# Patient Record
Sex: Female | Born: 1945 | Race: White | Hispanic: No | Smoking: Never smoker
Health system: Southern US, Community
[De-identification: ages and names within clinical notes are randomized; demographics above are authoritative.]

## PROBLEM LIST (undated history)

## (undated) DIAGNOSIS — M1990 Unspecified osteoarthritis, unspecified site: Secondary | ICD-10-CM

## (undated) DIAGNOSIS — Z9889 Other specified postprocedural states: Secondary | ICD-10-CM

## (undated) DIAGNOSIS — F32A Depression, unspecified: Secondary | ICD-10-CM

## (undated) DIAGNOSIS — K7460 Unspecified cirrhosis of liver: Secondary | ICD-10-CM

## (undated) DIAGNOSIS — M545 Low back pain, unspecified: Secondary | ICD-10-CM

## (undated) DIAGNOSIS — M797 Fibromyalgia: Secondary | ICD-10-CM

## (undated) DIAGNOSIS — G500 Trigeminal neuralgia: Secondary | ICD-10-CM

## (undated) DIAGNOSIS — E785 Hyperlipidemia, unspecified: Secondary | ICD-10-CM

## (undated) DIAGNOSIS — E039 Hypothyroidism, unspecified: Secondary | ICD-10-CM

## (undated) DIAGNOSIS — R112 Nausea with vomiting, unspecified: Secondary | ICD-10-CM

## (undated) DIAGNOSIS — J302 Other seasonal allergic rhinitis: Secondary | ICD-10-CM

## (undated) DIAGNOSIS — K7581 Nonalcoholic steatohepatitis (NASH): Secondary | ICD-10-CM

## (undated) DIAGNOSIS — F419 Anxiety disorder, unspecified: Secondary | ICD-10-CM

## (undated) DIAGNOSIS — K589 Irritable bowel syndrome without diarrhea: Secondary | ICD-10-CM

## (undated) DIAGNOSIS — G2581 Restless legs syndrome: Secondary | ICD-10-CM

## (undated) DIAGNOSIS — G43909 Migraine, unspecified, not intractable, without status migrainosus: Secondary | ICD-10-CM

## (undated) DIAGNOSIS — F329 Major depressive disorder, single episode, unspecified: Secondary | ICD-10-CM

## (undated) DIAGNOSIS — K219 Gastro-esophageal reflux disease without esophagitis: Secondary | ICD-10-CM

## (undated) DIAGNOSIS — J45909 Unspecified asthma, uncomplicated: Secondary | ICD-10-CM

## (undated) DIAGNOSIS — I10 Essential (primary) hypertension: Secondary | ICD-10-CM

## (undated) DIAGNOSIS — G8929 Other chronic pain: Secondary | ICD-10-CM

## (undated) DIAGNOSIS — Z8619 Personal history of other infectious and parasitic diseases: Secondary | ICD-10-CM

## (undated) DIAGNOSIS — C50919 Malignant neoplasm of unspecified site of unspecified female breast: Secondary | ICD-10-CM

## (undated) HISTORY — DX: Fibromyalgia: M79.7

## (undated) HISTORY — PX: DILATION AND CURETTAGE OF UTERUS: SHX78

## (undated) HISTORY — DX: Depression, unspecified: F32.A

## (undated) HISTORY — DX: Restless legs syndrome: G25.81

## (undated) HISTORY — DX: Irritable bowel syndrome, unspecified: K58.9

## (undated) HISTORY — PX: ABDOMINAL HYSTERECTOMY: SHX81

## (undated) HISTORY — PX: TONSILLECTOMY: SUR1361

## (undated) HISTORY — DX: Other chronic pain: G89.29

## (undated) HISTORY — DX: Low back pain, unspecified: M54.50

## (undated) HISTORY — DX: Major depressive disorder, single episode, unspecified: F32.9

## (undated) HISTORY — DX: Hypothyroidism, unspecified: E03.9

## (undated) HISTORY — DX: Gastro-esophageal reflux disease without esophagitis: K21.9

## (undated) HISTORY — DX: Hyperlipidemia, unspecified: E78.5

## (undated) HISTORY — DX: Low back pain: M54.5

## (undated) HISTORY — DX: Anxiety disorder, unspecified: F41.9

## (undated) HISTORY — DX: Migraine, unspecified, not intractable, without status migrainosus: G43.909

## (undated) HISTORY — DX: Essential (primary) hypertension: I10

## (undated) HISTORY — DX: Malignant neoplasm of unspecified site of unspecified female breast: C50.919

## (undated) HISTORY — DX: Trigeminal neuralgia: G50.0

## (undated) HISTORY — PX: CYSTOCELE REPAIR: SHX163

## (undated) HISTORY — DX: Unspecified osteoarthritis, unspecified site: M19.90

## (undated) HISTORY — PX: SEPTOPLASTY: SUR1290

## (undated) HISTORY — PX: RECTOCELE REPAIR: SHX761

## (undated) HISTORY — DX: Personal history of other infectious and parasitic diseases: Z86.19

## (undated) HISTORY — DX: Nonalcoholic steatohepatitis (NASH): K75.81

---

## 1989-04-17 HISTORY — PX: TOTAL ABDOMINAL HYSTERECTOMY W/ BILATERAL SALPINGOOPHORECTOMY: SHX83

## 1999-04-18 HISTORY — PX: ROTATOR CUFF REPAIR: SHX139

## 2003-04-18 HISTORY — PX: CHOLECYSTECTOMY: SHX55

## 2004-04-17 HISTORY — PX: ANKLE SURGERY: SHX546

## 2004-11-30 ENCOUNTER — Ambulatory Visit: Payer: Self-pay

## 2005-08-29 ENCOUNTER — Ambulatory Visit: Payer: Self-pay | Admitting: Family Medicine

## 2006-04-17 HISTORY — PX: GASTRIC BYPASS: SHX52

## 2006-05-09 ENCOUNTER — Ambulatory Visit: Payer: Self-pay | Admitting: Family Medicine

## 2006-05-14 ENCOUNTER — Ambulatory Visit: Payer: Self-pay | Admitting: Internal Medicine

## 2006-09-11 ENCOUNTER — Ambulatory Visit: Payer: Self-pay | Admitting: Internal Medicine

## 2006-11-21 ENCOUNTER — Ambulatory Visit: Payer: Self-pay | Admitting: Otolaryngology

## 2008-05-21 ENCOUNTER — Ambulatory Visit: Payer: Self-pay | Admitting: Family Medicine

## 2009-01-22 IMAGING — CR DG LUMBAR SPINE AP/LAT/OBLIQUES W/ FLEX AND EXT
1 series · 5 of 5 positions shown · non-contrast
Comparison: none

REASON FOR EXAM: xray l-spine pain
COMMENTS:

[Series 1: view not recorded · 0.17mm/px · 5 of 5 slices shown]
[im 1/5]
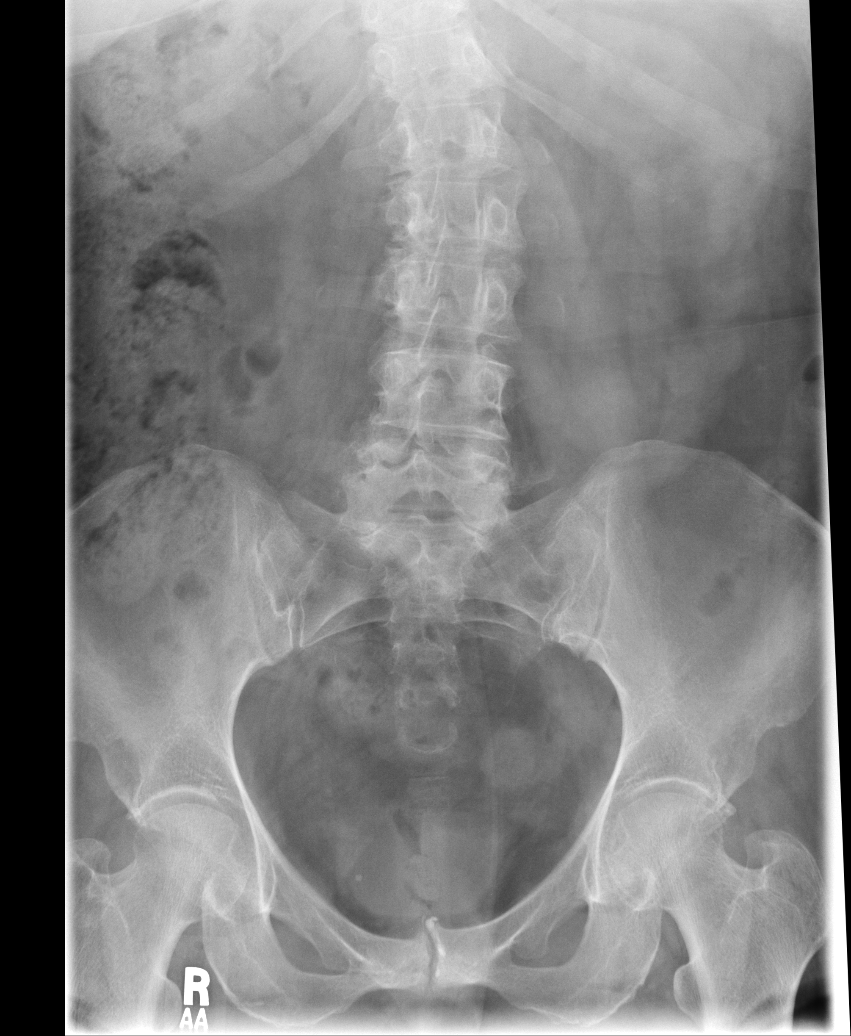
[im 2/5]
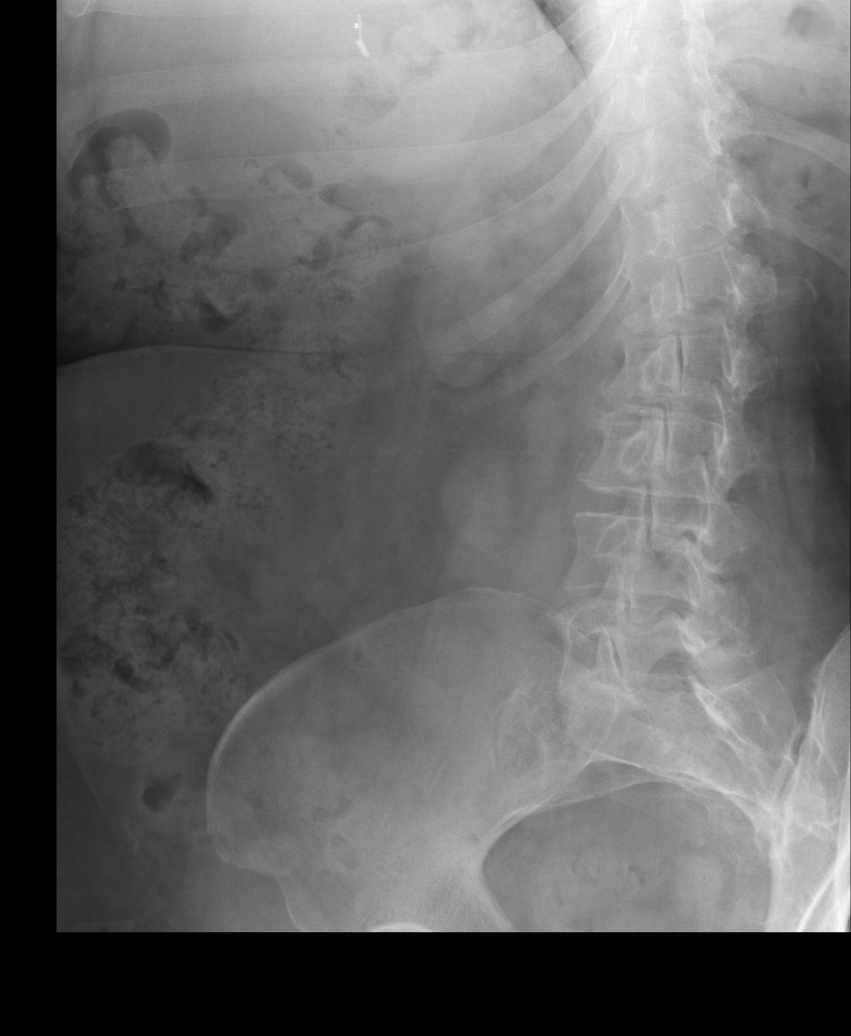
[im 3/5]
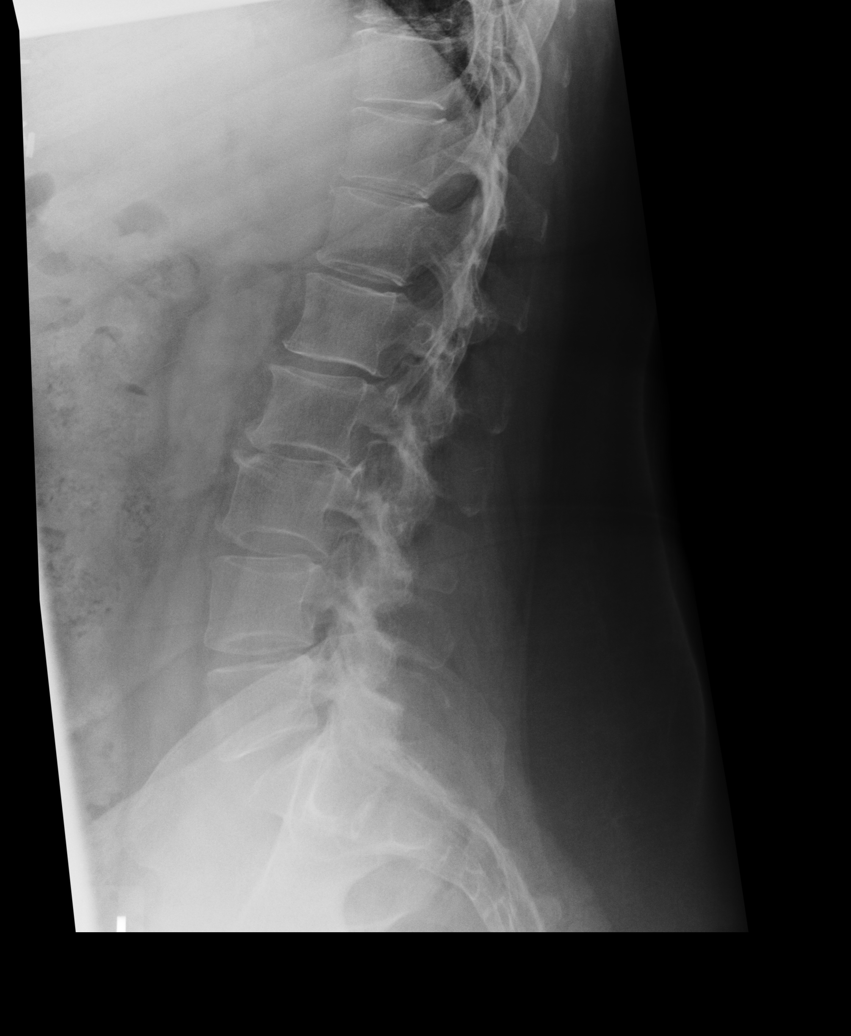
[im 4/5]
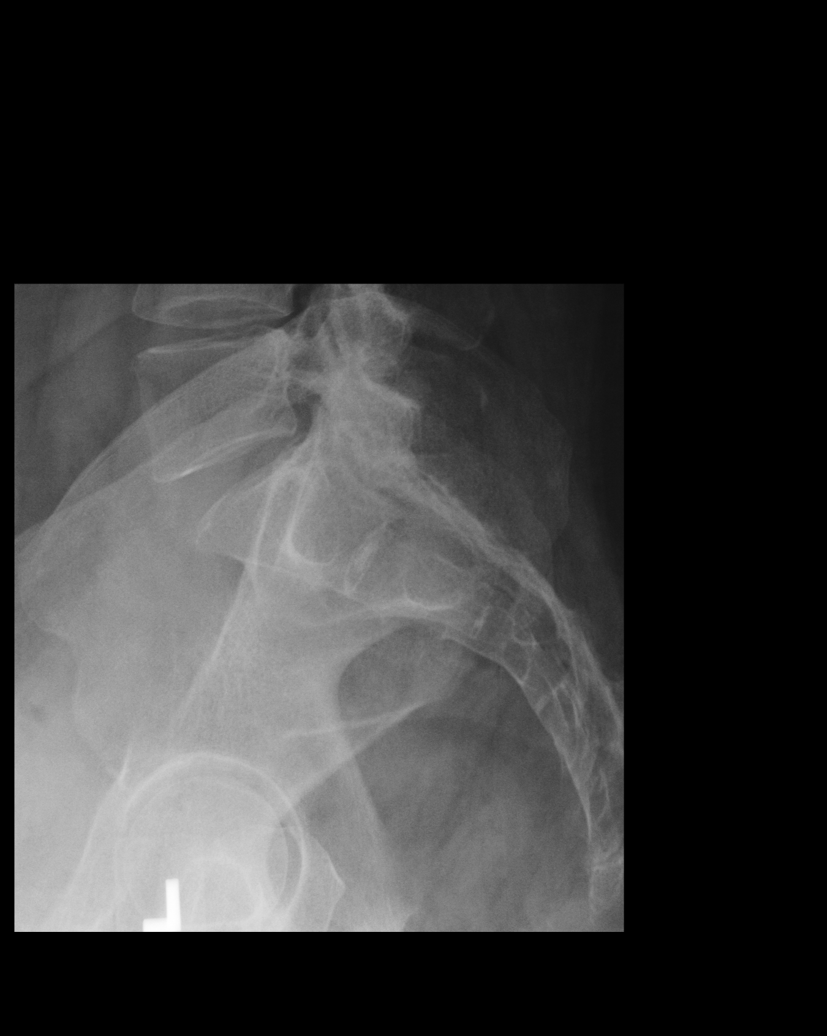
[im 5/5]
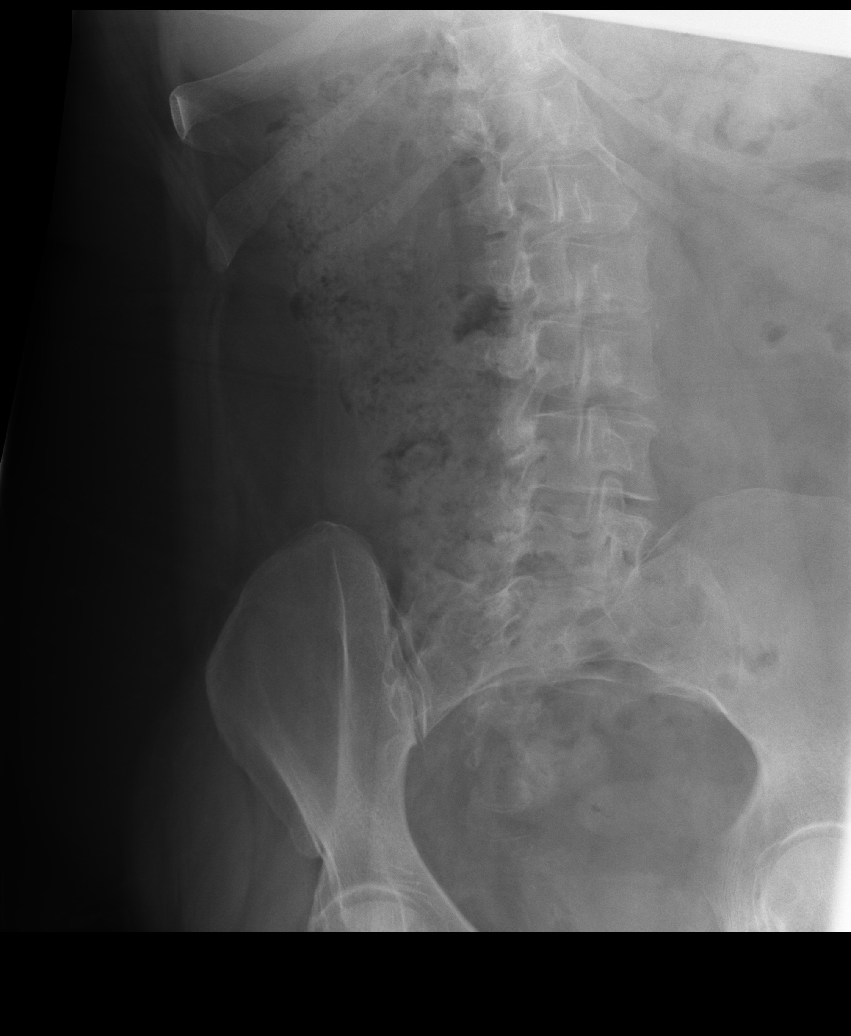

[5 of 5 positions shown; findings below may reference images not displayed]

PROCEDURE:     DXR - DXR LUMBAR SPINE WITH OBLIQUES  - May 09, 2006  [DATE]

RESULT:     Complete lumbar spine series is compared to the prior study of
11/30/04.  AP view shows a scoliotic curvature concave to the RIGHT centered
at the L1 level.  The bony structures remain intact.  There is some mild
intervertebral disk space narrowing.  Hypertrophic endplate spurring is
present.  No fracture is demonstrated.
IMPRESSION: Degenerative changes.

No acute bony abnormality.

## 2009-04-17 HISTORY — PX: MASTECTOMY: SHX3

## 2009-04-17 HISTORY — PX: BREAST BIOPSY: SHX20

## 2009-06-10 ENCOUNTER — Ambulatory Visit: Payer: Self-pay | Admitting: Family Medicine

## 2010-01-15 DIAGNOSIS — C50919 Malignant neoplasm of unspecified site of unspecified female breast: Secondary | ICD-10-CM

## 2010-01-15 HISTORY — DX: Malignant neoplasm of unspecified site of unspecified female breast: C50.919

## 2010-03-03 ENCOUNTER — Encounter: Payer: Self-pay | Admitting: Specialist

## 2011-06-12 ENCOUNTER — Encounter: Payer: Self-pay | Admitting: Internal Medicine

## 2011-06-12 ENCOUNTER — Ambulatory Visit (INDEPENDENT_AMBULATORY_CARE_PROVIDER_SITE_OTHER): Payer: Medicare Other | Admitting: Internal Medicine

## 2011-06-12 VITALS — BP 100/60 | HR 83 | Temp 98.1°F | Ht 61.0 in | Wt 199.0 lb

## 2011-06-12 DIAGNOSIS — M79604 Pain in right leg: Secondary | ICD-10-CM | POA: Insufficient documentation

## 2011-06-12 DIAGNOSIS — M79609 Pain in unspecified limb: Secondary | ICD-10-CM

## 2011-06-12 DIAGNOSIS — I10 Essential (primary) hypertension: Secondary | ICD-10-CM

## 2011-06-12 DIAGNOSIS — K7581 Nonalcoholic steatohepatitis (NASH): Secondary | ICD-10-CM | POA: Insufficient documentation

## 2011-06-12 DIAGNOSIS — M797 Fibromyalgia: Secondary | ICD-10-CM

## 2011-06-12 DIAGNOSIS — K7689 Other specified diseases of liver: Secondary | ICD-10-CM

## 2011-06-12 DIAGNOSIS — IMO0001 Reserved for inherently not codable concepts without codable children: Secondary | ICD-10-CM

## 2011-06-12 DIAGNOSIS — R5383 Other fatigue: Secondary | ICD-10-CM | POA: Insufficient documentation

## 2011-06-12 DIAGNOSIS — R5381 Other malaise: Secondary | ICD-10-CM

## 2011-06-12 MED ORDER — PREGABALIN 150 MG PO CAPS: 150.0000 mg | ORAL_CAPSULE | Freq: Every day | ORAL | Status: DC

## 2011-06-12 NOTE — Assessment & Plan Note (Signed)
Will get recent records for review.

## 2011-06-12 NOTE — Assessment & Plan Note (Signed)
Patient having some drowsiness with Lyrica. Will try tapering dose to 150 mg at bedtime. Followup one month.

## 2011-06-12 NOTE — Assessment & Plan Note (Signed)
Likely multifactorial. Patient gets for sleep as she is the primary caregiver for her husband who has MS. Patient is also on several medications which may contribute to fatigue including Lyrica. Will try reducing dose of Lyrica to see if any improvement. We'll also get records on recent lab work including CBC, CMP, and TSH. Follow up 1 month.

## 2011-06-12 NOTE — Progress Notes (Signed)
Subjective:    Patient ID: Gwendolyn [name], female    DOB: 18-May-1945, 66 y.o.   MRN: [id number]  HPI 66 year old female with history of fibromyalgia presents to establish care. She has 2 primary concerns today. First, she notes a several month history of severe fatigue. She reports that she recently had lab work including CBC, CMP, and TSH which were normal. She admits that she gets poor sleep and wakes every few hours because she is the primary caregiver for her husband. She questions whether this may be contributing. She also notes that she is on several medications to help with pain which may be causing some drowsiness. She denies shortness of breath, chest pain. She denies change in weight. She does have some chronic constipation but no diarrhea or other change in bowel habits.  She is also concerned today about several month history of gradually worsening leg pain. The pain is described as aching in both of her legs. It occurs at rest and last throughout the day. She is able to function and care for her husband, frequently lifting him. She is also able to participate in exercise such as water aerobics without difficulty. She has been using her Requip with very minimal improvement. She is also chronically on Lyrica for fibromyalgia with no improvement in her symptoms. She occasionally takes tramadol with no improvement. She denies swelling in her legs, weakness in her legs, or change in sensation.  Outpatient Encounter Prescriptions as of 06/12/2011  Medication Sig Dispense Refill  . baclofen (LIORESAL) 10 MG tablet Take 10 mg by mouth as directed. One in AM and 2 at night      . doxycycline (MONODOX) 100 MG capsule Take 100 mg by mouth daily.      . enalapril (VASOTEC) 10 MG tablet Take 10 mg by mouth daily.      Marland Kitchen letrozole (FEMARA) 2.5 MG tablet Take 2.5 mg by mouth daily.      Marland Kitchen levothyroxine (SYNTHROID, LEVOTHROID) 50 MCG tablet Take 50 mcg by mouth daily.      . Multiple Vitamins-Minerals  (MULTIVITAMIN WITH MINERALS) tablet Take 1 tablet by mouth daily.      . pantoprazole (PROTONIX) 40 MG tablet Take 40 mg by mouth daily.      . pregabalin (LYRICA) 150 MG capsule Take 1 capsule (150 mg total) by mouth at bedtime.  30 capsule  3  . rOPINIRole (REQUIP) 2 MG tablet Take 2 mg by mouth 2 (two) times daily.      . sertraline (ZOLOFT) 100 MG tablet Take 100 mg by mouth daily.      . traMADol (ULTRAM) 50 MG tablet Take 50-100 mg by mouth every 6 (six) hours as needed.      . vitamin E (VITAMIN E) 1000 UNIT capsule Take 1,000 Units by mouth daily.      Marland Kitchen DISCONTD: pregabalin (LYRICA) 100 MG capsule Take 300 mg by mouth at bedtime.        Review of Systems  Constitutional: Positive for fatigue. Negative for fever, chills, appetite change and unexpected weight change.  HENT: Negative for ear pain, congestion, sore throat, trouble swallowing, neck pain, voice change and sinus pressure.   Eyes: Negative for visual disturbance.  Respiratory: Negative for cough, shortness of breath, wheezing and stridor.   Cardiovascular: Negative for chest pain, palpitations and leg swelling.  Gastrointestinal: Negative for nausea, vomiting, abdominal pain, diarrhea, constipation, blood in stool, abdominal distention and anal bleeding.  Genitourinary: Negative for dysuria and flank pain.  Musculoskeletal: Positive for myalgias, back pain and arthralgias. Negative for gait problem.  Skin: Negative for color change and rash.  Neurological: Negative for dizziness and headaches.  Hematological: Negative for adenopathy. Does not bruise/bleed easily.  Psychiatric/Behavioral: Negative for suicidal ideas, sleep disturbance and dysphoric mood. The patient is not nervous/anxious.    BP 100/60  Pulse 83  Temp(Src) 98.1 F (36.7 C) (Oral)  Ht 5' 1"  (1.549 m)  Wt 199 lb (90.266 kg)  BMI 37.60 kg/m2  SpO2 97%     Objective:   Physical Exam  Constitutional: She is oriented to person, place, and time. She  appears well-developed and well-nourished. No distress.  HENT:  Head: Normocephalic and atraumatic.  Right Ear: External ear normal.  Left Ear: External ear normal.  Nose: Nose normal.  Mouth/Throat: Oropharynx is clear and moist. No oropharyngeal exudate.  Eyes: Conjunctivae are normal. Pupils are equal, round, and reactive to light. Right eye exhibits no discharge. Left eye exhibits no discharge. No scleral icterus.  Neck: Normal range of motion. Neck supple. No tracheal deviation present. No thyromegaly present.  Cardiovascular: Normal rate, regular rhythm, normal heart sounds and intact distal pulses.  Exam reveals no gallop and no friction rub.   No murmur heard. Pulmonary/Chest: Effort normal and breath sounds normal. No respiratory distress. She has no wheezes. She has no rales. She exhibits no tenderness.  Abdominal: Soft. Bowel sounds are normal. She exhibits no distension and no mass. There is no tenderness. There is no rebound and no guarding.  Musculoskeletal: Normal range of motion. She exhibits no edema and no tenderness.  Lymphadenopathy:    She has no cervical adenopathy.  Neurological: She is alert and oriented to person, place, and time. No cranial nerve deficit. She exhibits normal muscle tone. Coordination normal.  Skin: Skin is warm and dry. No rash noted. She is not diaphoretic. No erythema. No pallor.  Psychiatric: She has a normal mood and affect. Her behavior is normal. Judgment and thought content normal.          Assessment & Plan:

## 2011-06-12 NOTE — Assessment & Plan Note (Signed)
Blood pressure currently well controlled on enalapril. We'll plan to continue. We'll check renal function performed on recent labs. Follow up 1 month.

## 2011-06-12 NOTE — Assessment & Plan Note (Signed)
Likely multifactorial. Exam is normal. Patient has history of fibromyalgia, however this pain is reportedly much worse than baseline. Also has been active caring for her husband who has MS. Question if she may have worsened known degenerative changes in her lumbar spine leading to or nerve compression. Last MRI lumbar spine was January 2012. May need to consider repeating if symptoms persist. She notes that she had recent labs including electrolytes for further evaluation. Will get records on this. We discussed adding additional medications, however patient would prefer to hold off on this because of side effects from medicines. We'll have her followup in one month.

## 2011-07-10 ENCOUNTER — Ambulatory Visit (INDEPENDENT_AMBULATORY_CARE_PROVIDER_SITE_OTHER): Payer: Medicare Other | Admitting: Internal Medicine

## 2011-07-10 ENCOUNTER — Encounter: Payer: Self-pay | Admitting: Internal Medicine

## 2011-07-10 VITALS — BP 110/72 | HR 98 | Temp 97.6°F | Ht 61.0 in | Wt 201.0 lb

## 2011-07-10 DIAGNOSIS — M797 Fibromyalgia: Secondary | ICD-10-CM

## 2011-07-10 DIAGNOSIS — R5383 Other fatigue: Secondary | ICD-10-CM

## 2011-07-10 DIAGNOSIS — F32A Depression, unspecified: Secondary | ICD-10-CM

## 2011-07-10 DIAGNOSIS — F3289 Other specified depressive episodes: Secondary | ICD-10-CM

## 2011-07-10 DIAGNOSIS — K7581 Nonalcoholic steatohepatitis (NASH): Secondary | ICD-10-CM

## 2011-07-10 DIAGNOSIS — IMO0001 Reserved for inherently not codable concepts without codable children: Secondary | ICD-10-CM

## 2011-07-10 DIAGNOSIS — M79609 Pain in unspecified limb: Secondary | ICD-10-CM

## 2011-07-10 DIAGNOSIS — R5381 Other malaise: Secondary | ICD-10-CM

## 2011-07-10 DIAGNOSIS — M79604 Pain in right leg: Secondary | ICD-10-CM

## 2011-07-10 DIAGNOSIS — F329 Major depressive disorder, single episode, unspecified: Secondary | ICD-10-CM

## 2011-07-10 DIAGNOSIS — K7689 Other specified diseases of liver: Secondary | ICD-10-CM

## 2011-07-10 DIAGNOSIS — M79605 Pain in left leg: Secondary | ICD-10-CM

## 2011-07-10 MED ORDER — DULOXETINE HCL 30 MG PO CPEP: 30.0000 mg | ORAL_CAPSULE | Freq: Every day | ORAL | Status: DC

## 2011-07-10 NOTE — Patient Instructions (Signed)
Decrease Zoloft to 40m daily x 1 week. Start Cymbalta 364mdaily at the same time.

## 2011-07-10 NOTE — Progress Notes (Signed)
Subjective:    Patient ID: Gwendolyn [name], female    DOB: [birth year], 66 y.o.   MRN: [id number]  HPI 66YO female with h/o fibromyalgia and breast cancer presents for follow up. She reports significant improvement in bilateral leg pain and overall fibromyalgia pain after stopping Femara.  She understands the potential risk of stopping this medication. She is interested in changing from Zoloft to Cymbalta to help with overall fibromyalgia pain.  Outpatient Encounter Prescriptions as of 07/10/2011  Medication Sig Dispense Refill  . baclofen (LIORESAL) 10 MG tablet Take 10 mg by mouth as directed. One in AM and 2 at night      . doxycycline (MONODOX) 100 MG capsule Take 100 mg by mouth daily.      . DULoxetine (CYMBALTA) 30 MG capsule Take 1 capsule (30 mg total) by mouth daily.  30 capsule  1  . enalapril (VASOTEC) 10 MG tablet Take 10 mg by mouth daily.      Marland Kitchen levothyroxine (SYNTHROID, LEVOTHROID) 50 MCG tablet Take 50 mcg by mouth daily.      . Multiple Vitamins-Minerals (MULTIVITAMIN WITH MINERALS) tablet Take 1 tablet by mouth daily.      . pantoprazole (PROTONIX) 40 MG tablet Take 40 mg by mouth daily.      . pregabalin (LYRICA) 150 MG capsule Take 1 capsule (150 mg total) by mouth at bedtime.  30 capsule  3  . rOPINIRole (REQUIP) 2 MG tablet Take 2 mg by mouth 2 (two) times daily.      . sertraline (ZOLOFT) 100 MG tablet Take 100 mg by mouth daily.      . traMADol (ULTRAM) 50 MG tablet Take 50-100 mg by mouth every 6 (six) hours as needed.      . vitamin E (VITAMIN E) 1000 UNIT capsule Take 1,000 Units by mouth daily.      Marland Kitchen DISCONTD: letrozole (FEMARA) 2.5 MG tablet Take 2.5 mg by mouth daily.        Review of Systems  Constitutional: Negative for fever, chills, appetite change, fatigue and unexpected weight change.  HENT: Negative for ear pain, congestion, sore throat, trouble swallowing, neck pain, voice change and sinus pressure.   Eyes: Negative for visual disturbance.  Respiratory:  Negative for cough, shortness of breath, wheezing and stridor.   Cardiovascular: Negative for chest pain, palpitations and leg swelling.  Gastrointestinal: Negative for nausea, vomiting, abdominal pain, diarrhea, constipation, blood in stool, abdominal distention and anal bleeding.  Genitourinary: Negative for dysuria and flank pain.  Musculoskeletal: Positive for myalgias and arthralgias. Negative for gait problem.  Skin: Negative for color change and rash.  Neurological: Negative for dizziness and headaches.  Hematological: Negative for adenopathy. Does not bruise/bleed easily.  Psychiatric/Behavioral: Negative for suicidal ideas, sleep disturbance and dysphoric mood. The patient is not nervous/anxious.    BP 110/72  Pulse 98  Temp(Src) 97.6 F (36.4 C) (Oral)  Ht 5' 1"  (1.549 m)  Wt 201 lb (91.173 kg)  BMI 37.98 kg/m2  SpO2 98%     Objective:   Physical Exam  Constitutional: She is oriented to person, place, and time. She appears well-developed and well-nourished. No distress.  HENT:  Head: Normocephalic and atraumatic.  Right Ear: External ear normal.  Left Ear: External ear normal.  Nose: Nose normal.  Mouth/Throat: Oropharynx is clear and moist. No oropharyngeal exudate.  Eyes: Conjunctivae are normal. Pupils are equal, round, and reactive to light. Right eye exhibits no discharge. Left eye exhibits no discharge. No scleral icterus.  Neck: Normal range of motion. Neck supple. No tracheal deviation present. No thyromegaly present.  Cardiovascular: Normal rate, regular rhythm, normal heart sounds and intact distal pulses.  Exam reveals no gallop and no friction rub.   No murmur heard. Pulmonary/Chest: Effort normal and breath sounds normal. No respiratory distress. She has no wheezes. She has no rales. She exhibits no tenderness.  Musculoskeletal: Normal range of motion. She exhibits no edema and no tenderness.  Lymphadenopathy:    She has no cervical adenopathy.    Neurological: She is alert and oriented to person, place, and time. No cranial nerve deficit. She exhibits normal muscle tone. Coordination normal.  Skin: Skin is warm and dry. No rash noted. She is not diaphoretic. No erythema. No pallor.  Psychiatric: She has a normal mood and affect. Her behavior is normal. Judgment and thought content normal.          Assessment & Plan:

## 2011-07-10 NOTE — Assessment & Plan Note (Signed)
Review of recent labs from [facility] show normal liver function. Will obtain records from visit.

## 2011-07-10 NOTE — Assessment & Plan Note (Signed)
Will try changing from Zoloft to Cymbalta for better pain control.  Pt will taper Zoloft to 29m daily and then will add Cymbalta 343mdaily. Follow up in 3 weeks.

## 2011-07-10 NOTE — Assessment & Plan Note (Signed)
Improved with stopping Femara. Will continue to monitor.

## 2011-07-19 ENCOUNTER — Encounter: Payer: Self-pay | Admitting: Internal Medicine

## 2011-07-19 NOTE — Telephone Encounter (Signed)
Larene Beach, Can you look into delay in her referral to psychologist? Should be for Dr. Driscilla Moats. Thanks

## 2011-08-07 ENCOUNTER — Encounter: Payer: Self-pay | Admitting: Internal Medicine

## 2011-08-07 ENCOUNTER — Ambulatory Visit (INDEPENDENT_AMBULATORY_CARE_PROVIDER_SITE_OTHER): Payer: Medicare Other | Admitting: Internal Medicine

## 2011-08-07 VITALS — BP 114/79 | HR 80 | Ht 61.0 in | Wt 198.0 lb

## 2011-08-07 DIAGNOSIS — F3289 Other specified depressive episodes: Secondary | ICD-10-CM

## 2011-08-07 DIAGNOSIS — M25551 Pain in right hip: Secondary | ICD-10-CM

## 2011-08-07 DIAGNOSIS — M25559 Pain in unspecified hip: Secondary | ICD-10-CM

## 2011-08-07 DIAGNOSIS — M79604 Pain in right leg: Secondary | ICD-10-CM

## 2011-08-07 DIAGNOSIS — IMO0001 Reserved for inherently not codable concepts without codable children: Secondary | ICD-10-CM

## 2011-08-07 DIAGNOSIS — M79609 Pain in unspecified limb: Secondary | ICD-10-CM

## 2011-08-07 DIAGNOSIS — M79605 Pain in left leg: Secondary | ICD-10-CM

## 2011-08-07 DIAGNOSIS — R9389 Abnormal findings on diagnostic imaging of other specified body structures: Secondary | ICD-10-CM

## 2011-08-07 DIAGNOSIS — F32A Depression, unspecified: Secondary | ICD-10-CM

## 2011-08-07 DIAGNOSIS — F329 Major depressive disorder, single episode, unspecified: Secondary | ICD-10-CM

## 2011-08-07 DIAGNOSIS — M797 Fibromyalgia: Secondary | ICD-10-CM

## 2011-08-07 DIAGNOSIS — R918 Other nonspecific abnormal finding of lung field: Secondary | ICD-10-CM

## 2011-08-07 MED ORDER — DULOXETINE HCL 30 MG PO CPEP: 60.0000 mg | ORAL_CAPSULE | Freq: Every day | ORAL | Status: DC

## 2011-08-07 MED ORDER — DULOXETINE HCL 60 MG PO CPEP: 60.0000 mg | ORAL_CAPSULE | Freq: Every day | ORAL | Status: DC

## 2011-08-07 MED ORDER — TRAMADOL HCL 50 MG PO TABS: 50.0000 mg | ORAL_TABLET | Freq: Four times a day (QID) | ORAL | Status: DC | PRN

## 2011-08-07 NOTE — Assessment & Plan Note (Signed)
Likely multifactorial. Probably secondary to fibromyalgia. Patient is also having more focal pain in her right hip, particularly with prolonged sitting. Exam is normal. She would like to set up with orthopedics for further evaluation. We'll set up referral.

## 2011-08-07 NOTE — Assessment & Plan Note (Signed)
Noted on recent CT of the chest abdomen and pelvis performed at [facility]. We'll plan to repeat CT of the chest in 3-6 months.

## 2011-08-07 NOTE — Progress Notes (Signed)
Subjective:    Patient ID: Gwendolyn [name], female    DOB: 19-May-1945, 66 y.o.   MRN: [id number]  HPI 66 year old female with history of depression, fibromyalgia, and chronic bilateral leg and hip pain presents for followup. In regards to her depression she notes no worsening of symptoms with the change to Cymbalta. However, she has not had any improvement in her pain symptoms. She continues to have diffuse pain and, more focally right hip pain. She is interested in setting up referral for further evaluation of her right hip pain. She notes that this has been present for several months. It is worsened by prolonged sitting. It is described as aching. She has felt as if her legs might give out on her but it has never actually done so. Pain is not improved with baclofen, Lyrica, or Cymbalta.  Outpatient Encounter Prescriptions as of 08/07/2011  Medication Sig Dispense Refill  . baclofen (LIORESAL) 10 MG tablet Take 10 mg by mouth as directed. One in AM and 2 at night      . CALCIUM PO Take 1 tablet by mouth daily.      Marland Kitchen doxycycline (MONODOX) 100 MG capsule Take 100 mg by mouth daily.      . DULoxetine (CYMBALTA) 60 MG capsule Take 1 capsule (60 mg total) by mouth daily.  90 capsule  4  . enalapril (VASOTEC) 10 MG tablet Take 10 mg by mouth daily.      Marland Kitchen levothyroxine (SYNTHROID, LEVOTHROID) 50 MCG tablet Take 50 mcg by mouth daily.      . Multiple Vitamins-Minerals (MULTIVITAMIN WITH MINERALS) tablet Take 1 tablet by mouth daily.      . pantoprazole (PROTONIX) 40 MG tablet Take 40 mg by mouth daily.      . pregabalin (LYRICA) 150 MG capsule Take 1 capsule (150 mg total) by mouth at bedtime.  30 capsule  3  . rOPINIRole (REQUIP) 2 MG tablet Take 2 mg by mouth 2 (two) times daily.      . traMADol (ULTRAM) 50 MG tablet Take 1-2 tablets (50-100 mg total) by mouth every 6 (six) hours as needed.  90 tablet  3  . vitamin E (VITAMIN E) 1000 UNIT capsule Take 1,000 Units by mouth daily.        Review of  Systems  Constitutional: Negative for fever, chills, appetite change, fatigue and unexpected weight change.  HENT: Negative for ear pain, congestion, sore throat, trouble swallowing, neck pain, voice change and sinus pressure.   Eyes: Negative for visual disturbance.  Respiratory: Negative for cough, shortness of breath, wheezing and stridor.   Cardiovascular: Negative for chest pain, palpitations and leg swelling.  Gastrointestinal: Negative for nausea, vomiting, abdominal pain, diarrhea, constipation, blood in stool, abdominal distention and anal bleeding.  Genitourinary: Negative for dysuria and flank pain.  Musculoskeletal: Positive for myalgias, back pain and arthralgias. Negative for gait problem.  Skin: Negative for color change and rash.  Neurological: Negative for dizziness and headaches.  Hematological: Negative for adenopathy. Does not bruise/bleed easily.  Psychiatric/Behavioral: Negative for suicidal ideas, sleep disturbance and dysphoric mood. The patient is not nervous/anxious.    BP 114/79  Pulse 80  Ht 5' 1"  (1.549 m)  Wt 198 lb (89.812 kg)  BMI 37.41 kg/m2     Objective:   Physical Exam  Constitutional: She is oriented to person, place, and time. She appears well-developed and well-nourished. No distress.  HENT:  Head: Normocephalic and atraumatic.  Right Ear: External ear normal.  Left Ear:  External ear normal.  Nose: Nose normal.  Mouth/Throat: Oropharynx is clear and moist. No oropharyngeal exudate.  Eyes: Conjunctivae are normal. Pupils are equal, round, and reactive to light. Right eye exhibits no discharge. Left eye exhibits no discharge. No scleral icterus.  Neck: Normal range of motion. Neck supple. No tracheal deviation present. No thyromegaly present.  Cardiovascular: Normal rate, regular rhythm, normal heart sounds and intact distal pulses.  Exam reveals no gallop and no friction rub.   No murmur heard. Pulmonary/Chest: Effort normal and breath sounds  normal. No respiratory distress. She has no wheezes. She has no rales. She exhibits no tenderness.  Musculoskeletal: Normal range of motion. She exhibits no edema and no tenderness.       Right hip: She exhibits normal range of motion, normal strength, no tenderness and no deformity.  Lymphadenopathy:    She has no cervical adenopathy.  Neurological: She is alert and oriented to person, place, and time. No cranial nerve deficit. She exhibits normal muscle tone. Coordination normal.  Skin: Skin is warm and dry. No rash noted. She is not diaphoretic. No erythema. No pallor.  Psychiatric: She has a normal mood and affect. Her behavior is normal. Judgment and thought content normal.          Assessment & Plan:

## 2011-08-07 NOTE — Assessment & Plan Note (Signed)
No improvement with change from Zoloft to Cymbalta. Will try increasing dose of Cymbalta to 60 mg daily. Followup one month.

## 2011-09-18 ENCOUNTER — Encounter: Payer: Self-pay | Admitting: Internal Medicine

## 2011-09-18 ENCOUNTER — Ambulatory Visit (INDEPENDENT_AMBULATORY_CARE_PROVIDER_SITE_OTHER): Payer: Medicare Other | Admitting: Internal Medicine

## 2011-09-18 VITALS — BP 130/90 | HR 94 | Temp 98.2°F | Ht 61.0 in | Wt 195.8 lb

## 2011-09-18 DIAGNOSIS — Z23 Encounter for immunization: Secondary | ICD-10-CM

## 2011-09-18 DIAGNOSIS — Z853 Personal history of malignant neoplasm of breast: Secondary | ICD-10-CM

## 2011-09-18 DIAGNOSIS — M797 Fibromyalgia: Secondary | ICD-10-CM

## 2011-09-18 DIAGNOSIS — IMO0001 Reserved for inherently not codable concepts without codable children: Secondary | ICD-10-CM

## 2011-09-18 NOTE — Assessment & Plan Note (Signed)
Symptoms improved with Cymbalta. Will plan to continue. Patient understands the potential interaction between Cymbalta and tamoxifen in prefers not to take tamoxifen because similar medications have led to worsening myalgia pain in the past.

## 2011-09-18 NOTE — Assessment & Plan Note (Signed)
Status post mastectomy. Planning for reconstructive surgery. Will obtain notes from plastic surgeon.

## 2011-09-18 NOTE — Progress Notes (Signed)
Subjective:    Patient ID: Gwendolyn [name], female    DOB: [birth year], 66 y.o.   MRN: [id number]  HPI 66 year old female with history of breast cancer, fibromyalgia, and arthritis pain presents for followup. She reports she is generally doing well. She is preparing for breast reconstructive surgery next month. She is in the process of arranging care for her husband while she is gone. She reports that she has preoperative evaluation set up next week.  In regards to her history of fibromyalgia, she reports improvement in her symptoms with use of Cymbalta. She discussed this with her oncologist who is concerned about the potential interaction between Cymbalta and tamoxifen. He recommended that she discontinue Cymbalta and use tamoxifen to help lower her risk of recurrent breast cancer. However, she feels that her pain is so improved with the Cymbalta that she prefers to defer taking tamoxifen at this time. She understands that her potential risk of recurrence increases from 2% to approximately 5% without the use of tamoxifen.  Outpatient Encounter Prescriptions as of 09/18/2011  Medication Sig Dispense Refill  . baclofen (LIORESAL) 10 MG tablet Take 10 mg by mouth as directed. One in AM and 2 at night      . CALCIUM PO Take 1 tablet by mouth daily.      Marland Kitchen doxycycline (MONODOX) 100 MG capsule Take 100 mg by mouth daily.      . DULoxetine (CYMBALTA) 60 MG capsule Take 1 capsule (60 mg total) by mouth daily.  90 capsule  4  . enalapril (VASOTEC) 10 MG tablet Take 10 mg by mouth daily.      Marland Kitchen levothyroxine (SYNTHROID, LEVOTHROID) 50 MCG tablet Take 50 mcg by mouth daily.      . Multiple Vitamins-Minerals (MULTIVITAMIN WITH MINERALS) tablet Take 1 tablet by mouth daily.      . pantoprazole (PROTONIX) 40 MG tablet Take 40 mg by mouth daily.      . pregabalin (LYRICA) 150 MG capsule Take 1 capsule (150 mg total) by mouth at bedtime.  30 capsule  3  . rOPINIRole (REQUIP) 2 MG tablet Take 2 mg by mouth 2 (two)  times daily.      . traMADol (ULTRAM) 50 MG tablet Take 1-2 tablets (50-100 mg total) by mouth every 6 (six) hours as needed.  90 tablet  3  . vitamin E (VITAMIN E) 1000 UNIT capsule Take 1,000 Units by mouth daily.       BP 130/90  Pulse 94  Temp(Src) 98.2 F (36.8 C) (Oral)  Ht 5' 1"  (1.549 m)  Wt 195 lb 12 oz (88.792 kg)  BMI 36.99 kg/m2  SpO2 96%  Review of Systems  Constitutional: Negative for fever, chills, appetite change, fatigue and unexpected weight change.  HENT: Negative for ear pain, congestion, sore throat, trouble swallowing, neck pain, voice change and sinus pressure.   Eyes: Negative for visual disturbance.  Respiratory: Negative for cough, shortness of breath, wheezing and stridor.   Cardiovascular: Negative for chest pain, palpitations and leg swelling.  Gastrointestinal: Negative for nausea, vomiting, abdominal pain, diarrhea, constipation, blood in stool, abdominal distention and anal bleeding.  Genitourinary: Negative for dysuria and flank pain.  Musculoskeletal: Positive for myalgias and arthralgias. Negative for gait problem.  Skin: Negative for color change and rash.  Neurological: Negative for dizziness and headaches.  Hematological: Negative for adenopathy. Does not bruise/bleed easily.  Psychiatric/Behavioral: Negative for suicidal ideas, sleep disturbance and dysphoric mood. The patient is not nervous/anxious.  Objective:   Physical Exam  Constitutional: She is oriented to person, place, and time. She appears well-developed and well-nourished. No distress.  HENT:  Head: Normocephalic and atraumatic.  Right Ear: External ear normal.  Left Ear: External ear normal.  Nose: Nose normal.  Mouth/Throat: Oropharynx is clear and moist. No oropharyngeal exudate.  Eyes: Conjunctivae are normal. Pupils are equal, round, and reactive to light. Right eye exhibits no discharge. Left eye exhibits no discharge. No scleral icterus.  Neck: Normal range of  motion. Neck supple. No tracheal deviation present. No thyromegaly present.  Cardiovascular: Normal rate, regular rhythm, normal heart sounds and intact distal pulses.  Exam reveals no gallop and no friction rub.   No murmur heard. Pulmonary/Chest: Effort normal and breath sounds normal. No respiratory distress. She has no wheezes. She has no rales. She exhibits no tenderness.  Musculoskeletal: Normal range of motion. She exhibits no edema and no tenderness.  Lymphadenopathy:    She has no cervical adenopathy.  Neurological: She is alert and oriented to person, place, and time. No cranial nerve deficit. She exhibits normal muscle tone. Coordination normal.  Skin: Skin is warm and dry. No rash noted. She is not diaphoretic. No erythema. No pallor.  Psychiatric: She has a normal mood and affect. Her behavior is normal. Judgment and thought content normal.          Assessment & Plan:

## 2011-11-23 ENCOUNTER — Other Ambulatory Visit: Payer: Self-pay | Admitting: Orthopedic Surgery

## 2011-11-23 DIAGNOSIS — M1990 Unspecified osteoarthritis, unspecified site: Secondary | ICD-10-CM

## 2011-11-24 ENCOUNTER — Ambulatory Visit
Admission: RE | Admit: 2011-11-24 | Discharge: 2011-11-24 | Disposition: A | Discharge status: Home / Self Care | Payer: Medicare Other | Source: Ambulatory Visit | Attending: Orthopedic Surgery | Admitting: Orthopedic Surgery

## 2011-11-24 DIAGNOSIS — M1990 Unspecified osteoarthritis, unspecified site: Secondary | ICD-10-CM

## 2011-11-24 MED ORDER — IOHEXOL 300 MG/ML  SOLN
100.0000 mL | Freq: Once | INTRAMUSCULAR | Status: AC | PRN
  Administered 2011-11-24: 100 mL via INTRAVENOUS

## 2011-12-13 ENCOUNTER — Ambulatory Visit (INDEPENDENT_AMBULATORY_CARE_PROVIDER_SITE_OTHER): Payer: Medicare Other | Admitting: Internal Medicine

## 2011-12-13 ENCOUNTER — Encounter: Payer: Self-pay | Admitting: Internal Medicine

## 2011-12-13 VITALS — BP 118/74 | HR 91 | Temp 99.1°F | Ht 61.0 in | Wt 205.0 lb

## 2011-12-13 DIAGNOSIS — IMO0001 Reserved for inherently not codable concepts without codable children: Secondary | ICD-10-CM

## 2011-12-13 DIAGNOSIS — M5416 Radiculopathy, lumbar region: Secondary | ICD-10-CM

## 2011-12-13 DIAGNOSIS — IMO0002 Reserved for concepts with insufficient information to code with codable children: Secondary | ICD-10-CM

## 2011-12-13 DIAGNOSIS — Z853 Personal history of malignant neoplasm of breast: Secondary | ICD-10-CM

## 2011-12-13 DIAGNOSIS — M797 Fibromyalgia: Secondary | ICD-10-CM

## 2011-12-13 MED ORDER — PANTOPRAZOLE SODIUM 40 MG PO TBEC: 40.0000 mg | DELAYED_RELEASE_TABLET | Freq: Every day | ORAL | Status: DC

## 2011-12-13 MED ORDER — BACLOFEN 10 MG PO TABS: ORAL_TABLET | ORAL | Status: DC

## 2011-12-13 MED ORDER — ROPINIROLE HCL 2 MG PO TABS: 2.0000 mg | ORAL_TABLET | Freq: Two times a day (BID) | ORAL | Status: DC

## 2011-12-13 MED ORDER — LEVOTHYROXINE SODIUM 50 MCG PO TABS: 50.0000 ug | ORAL_TABLET | Freq: Every day | ORAL | Status: DC

## 2011-12-13 MED ORDER — TRAMADOL HCL 50 MG PO TABS: 50.0000 mg | ORAL_TABLET | Freq: Four times a day (QID) | ORAL | Status: DC | PRN

## 2011-12-13 MED ORDER — ENALAPRIL MALEATE 10 MG PO TABS: 10.0000 mg | ORAL_TABLET | Freq: Every day | ORAL | Status: DC

## 2011-12-13 MED ORDER — PREDNISONE (PAK) 10 MG PO TABS: ORAL_TABLET | ORAL | Status: DC

## 2011-12-13 MED ORDER — HYDROCODONE-ACETAMINOPHEN 5-500 MG PO TABS: 1.0000 | ORAL_TABLET | Freq: Three times a day (TID) | ORAL | Status: AC | PRN

## 2011-12-13 MED ORDER — METHYLPREDNISOLONE SODIUM SUCC 125 MG IJ SOLR
62.5000 mg | Freq: Once | INTRAMUSCULAR | Status: AC
  Administered 2011-12-13: 62.5 mg via INTRAMUSCULAR

## 2011-12-13 MED ORDER — PREGABALIN 150 MG PO CAPS: 150.0000 mg | ORAL_CAPSULE | Freq: Every day | ORAL | Status: DC

## 2011-12-13 NOTE — Progress Notes (Signed)
Subjective:    Patient ID: Gwendolyn [name], female    DOB: 06-15-1945, 66 y.o.   MRN: [id number]  HPI 66YO female with h/o hypertension, breast cancer s/p mastectomy and recent reconstruction, presents for acute visit complaining of severe lower back pain, weakness in her legs and difficulty ambulating. She was seen by orthopedics and had CT lumbar spine which showed degenerative changes and nerve root compression at multiple levels. Pt reports symptoms of pain in lower back, radiating down both legs has significantly worsened over last couple of days. No loss of control of bowel or bladder. No fever, chills. No falls. Currently using lyrica and tramadol for pain with no improvement. Unable to sleep because of severe pain.  In regards to h/o breast cancer, pt reports recent surgery went well with reconstruction of left breast. She is tolerating increased amounts of saline in her implant to improve appearance of left breast. Denies pain at surgical site, fever, chills.  Outpatient Encounter Prescriptions as of 12/13/2011  Medication Sig Dispense Refill  . baclofen (LIORESAL) 10 MG tablet One in the morning and two at night  270 each  3  . CALCIUM PO Take 1 tablet by mouth daily.      . DULoxetine (CYMBALTA) 60 MG capsule Take 1 capsule (60 mg total) by mouth daily.  90 capsule  4  . enalapril (VASOTEC) 10 MG tablet Take 1 tablet (10 mg total) by mouth daily.  90 tablet  3  . levothyroxine (SYNTHROID, LEVOTHROID) 50 MCG tablet Take 1 tablet (50 mcg total) by mouth daily.  90 tablet  3  . Multiple Vitamins-Minerals (MULTIVITAMIN WITH MINERALS) tablet Take 1 tablet by mouth daily.      . pantoprazole (PROTONIX) 40 MG tablet Take 1 tablet (40 mg total) by mouth daily.  90 tablet  3  . pregabalin (LYRICA) 150 MG capsule Take 1 capsule (150 mg total) by mouth at bedtime.  90 capsule  3  . rOPINIRole (REQUIP) 2 MG tablet Take 1 tablet (2 mg total) by mouth 2 (two) times daily.  180 tablet  3  . traMADol  (ULTRAM) 50 MG tablet Take 1-2 tablets (50-100 mg total) by mouth every 6 (six) hours as needed.  90 tablet  3  . vitamin E (VITAMIN E) 1000 UNIT capsule Take 1,000 Units by mouth daily.      Marland Kitchen DISCONTD: baclofen (LIORESAL) 10 MG tablet One in the morning and two at night      . DISCONTD: enalapril (VASOTEC) 10 MG tablet Take 10 mg by mouth daily.      Marland Kitchen DISCONTD: levothyroxine (SYNTHROID, LEVOTHROID) 50 MCG tablet Take 50 mcg by mouth daily.      Marland Kitchen DISCONTD: pantoprazole (PROTONIX) 40 MG tablet Take 40 mg by mouth daily.      Marland Kitchen DISCONTD: pregabalin (LYRICA) 150 MG capsule Take 1 capsule (150 mg total) by mouth at bedtime.  30 capsule  3  . DISCONTD: rOPINIRole (REQUIP) 2 MG tablet Take 2 mg by mouth 2 (two) times daily.      Marland Kitchen DISCONTD: traMADol (ULTRAM) 50 MG tablet Take 1-2 tablets (50-100 mg total) by mouth every 6 (six) hours as needed.  90 tablet  3  . HYDROcodone-acetaminophen (VICODIN) 5-500 MG per tablet Take 1 tablet by mouth every 8 (eight) hours as needed for pain.  90 tablet  0  . predniSONE (STERAPRED UNI-PAK) 10 MG tablet Take 88m day 1 then taper by 160mdaily  21 tablet  0  .  DISCONTD: doxycycline (MONODOX) 100 MG capsule Take 100 mg by mouth daily.       Facility-Administered Encounter Medications as of 12/13/2011  Medication Dose Route Frequency Provider Last Rate Last Dose  . methylPREDNISolone sodium succinate (SOLU-MEDROL) 125 mg/2 mL injection 62.5 mg  62.5 mg Intramuscular Once Jackolyn Confer, MD   62.5 mg at 12/13/11 0933   BP 118/74  Pulse 91  Temp 99.1 F (37.3 C) (Oral)  Ht 5' 1"  (1.549 m)  Wt 205 lb (92.987 kg)  BMI 38.73 kg/m2  SpO2 99%  Review of Systems  Constitutional: Negative for fever, chills, appetite change, fatigue and unexpected weight change.  HENT: Negative for ear pain, congestion, sore throat, trouble swallowing, neck pain, voice change and sinus pressure.   Eyes: Negative for visual disturbance.  Respiratory: Negative for cough, shortness  of breath, wheezing and stridor.   Cardiovascular: Negative for chest pain, palpitations and leg swelling.  Gastrointestinal: Negative for nausea, vomiting, abdominal pain, diarrhea, constipation, blood in stool, abdominal distention and anal bleeding.  Genitourinary: Negative for dysuria and flank pain.  Musculoskeletal: Positive for myalgias, back pain, arthralgias and gait problem.  Skin: Negative for color change and rash.  Neurological: Positive for weakness. Negative for dizziness and headaches.  Hematological: Negative for adenopathy. Does not bruise/bleed easily.  Psychiatric/Behavioral: Negative for suicidal ideas, disturbed wake/sleep cycle and dysphoric mood. The patient is not nervous/anxious.        Objective:   Physical Exam  Constitutional: She is oriented to person, place, and time. She appears well-developed and well-nourished. She appears distressed.  HENT:  Head: Normocephalic and atraumatic.  Right Ear: External ear normal.  Left Ear: External ear normal.  Nose: Nose normal.  Mouth/Throat: Oropharynx is clear and moist. No oropharyngeal exudate.  Eyes: Conjunctivae are normal. Pupils are equal, round, and reactive to light. Right eye exhibits no discharge. Left eye exhibits no discharge. No scleral icterus.  Neck: Normal range of motion. Neck supple. No tracheal deviation present. No thyromegaly present.  Cardiovascular: Normal rate, regular rhythm, normal heart sounds and intact distal pulses.  Exam reveals no gallop and no friction rub.   No murmur heard. Pulmonary/Chest: Effort normal and breath sounds normal. No respiratory distress. She has no wheezes. She has no rales. She exhibits no tenderness.  Musculoskeletal: She exhibits no edema and no tenderness.       Lumbar back: She exhibits decreased range of motion, tenderness, pain and spasm.  Lymphadenopathy:    She has no cervical adenopathy.  Neurological: She is alert and oriented to person, place, and time.  No cranial nerve deficit. She exhibits normal muscle tone. Coordination normal.  Skin: Skin is warm and dry. No rash noted. She is not diaphoretic. No erythema. No pallor.  Psychiatric: She has a normal mood and affect. Her behavior is normal. Judgment and thought content normal.          Assessment & Plan:

## 2011-12-13 NOTE — Assessment & Plan Note (Signed)
S/p recent reconstruction of left breast. Doing well. Will request records on hospitalization.

## 2011-12-13 NOTE — Assessment & Plan Note (Signed)
Symptoms severe.  Will add prednisone taper to see if any improvement. Will add vicodin for severe pain. Will set up neurosurgical evaluation given nerve compression at multiple levels on CT lumbar spine (note pt unable to have MRI because port in place).

## 2011-12-22 ENCOUNTER — Encounter: Payer: Self-pay | Admitting: Internal Medicine

## 2011-12-22 ENCOUNTER — Ambulatory Visit (INDEPENDENT_AMBULATORY_CARE_PROVIDER_SITE_OTHER): Payer: Medicare Other | Admitting: Internal Medicine

## 2011-12-22 VITALS — BP 104/72 | HR 98 | Temp 98.0°F | Ht 61.0 in | Wt 198.5 lb

## 2011-12-22 DIAGNOSIS — M545 Low back pain, unspecified: Secondary | ICD-10-CM | POA: Insufficient documentation

## 2011-12-22 MED ORDER — CYCLOBENZAPRINE HCL 5 MG PO TABS: 5.0000 mg | ORAL_TABLET | Freq: Three times a day (TID) | ORAL | Status: AC | PRN

## 2011-12-22 NOTE — Assessment & Plan Note (Signed)
Pain secondary to known degenerative changes and radiculopathy, with spasm of paraspinal muscles. Will start Flexeril to see if any improvement. Will also have her apply topical Lidoderm patch. She has followup with neurosurgery next week.

## 2011-12-22 NOTE — Progress Notes (Signed)
Subjective:    Patient ID: Gwendolyn [name], female    DOB: 10-10-1945, 66 y.o.   MRN: [id number]  HPI 66 year old female presents for acute visit complaining of left flank pain. She reports that pain started a few days ago. It is described as sharp and cramping. It radiates around her left side. It is most severe in her back. She denies any shortness of breath. She denies any cough, fever, chills. She denies dysuria. Notably, she had a recent CT of the lumbar spine which showed multilevel degenerative changes and nerve compression. She is scheduled to see neurosurgery next week. She has been taking Lyrica and Vicodin with minimal improvement in her symptoms.  Outpatient Encounter Prescriptions as of 12/22/2011  Medication Sig Dispense Refill  . baclofen (LIORESAL) 10 MG tablet One in the morning and two at night  270 each  3  . CALCIUM PO Take 1 tablet by mouth daily.      . DULoxetine (CYMBALTA) 60 MG capsule Take 1 capsule (60 mg total) by mouth daily.  90 capsule  4  . enalapril (VASOTEC) 10 MG tablet Take 1 tablet (10 mg total) by mouth daily.  90 tablet  3  . HYDROcodone-acetaminophen (VICODIN) 5-500 MG per tablet Take 1 tablet by mouth every 8 (eight) hours as needed for pain.  90 tablet  0  . levothyroxine (SYNTHROID, LEVOTHROID) 50 MCG tablet Take 1 tablet (50 mcg total) by mouth daily.  90 tablet  3  . Multiple Vitamins-Minerals (MULTIVITAMIN WITH MINERALS) tablet Take 1 tablet by mouth daily.      . pantoprazole (PROTONIX) 40 MG tablet Take 1 tablet (40 mg total) by mouth daily.  90 tablet  3  . pregabalin (LYRICA) 150 MG capsule Take 1 capsule (150 mg total) by mouth at bedtime.  90 capsule  3  . rOPINIRole (REQUIP) 2 MG tablet Take 1 tablet (2 mg total) by mouth 2 (two) times daily.  180 tablet  3  . traMADol (ULTRAM) 50 MG tablet Take 1-2 tablets (50-100 mg total) by mouth every 6 (six) hours as needed.  90 tablet  3  . vitamin E (VITAMIN E) 1000 UNIT capsule Take 1,000 Units by mouth  daily.      . cyclobenzaprine (FLEXERIL) 5 MG tablet Take 1 tablet (5 mg total) by mouth 3 (three) times daily as needed for muscle spasms.  90 tablet  1  . DISCONTD: predniSONE (STERAPRED UNI-PAK) 10 MG tablet Take 45m day 1 then taper by 152mdaily  21 tablet  0   BP 104/72  Pulse 98  Temp 98 F (36.7 C) (Oral)  Ht 5' 1"  (1.549 m)  Wt 198 lb 8 oz (90.039 kg)  BMI 37.51 kg/m2  SpO2 95%  Review of Systems  Constitutional: Negative for fever, chills, appetite change, fatigue and unexpected weight change.  HENT: Negative for ear pain, congestion, sore throat, trouble swallowing, neck pain, voice change and sinus pressure.   Eyes: Negative for visual disturbance.  Respiratory: Negative for cough, shortness of breath, wheezing and stridor.   Cardiovascular: Negative for chest pain, palpitations and leg swelling.  Gastrointestinal: Negative for nausea, vomiting, abdominal pain, diarrhea, constipation, blood in stool, abdominal distention and anal bleeding.  Genitourinary: Negative for dysuria and flank pain.  Musculoskeletal: Positive for myalgias, back pain and arthralgias. Negative for gait problem.  Skin: Negative for color change and rash.  Neurological: Negative for dizziness and headaches.  Hematological: Negative for adenopathy. Does not bruise/bleed easily.  Psychiatric/Behavioral: Negative  for suicidal ideas, disturbed wake/sleep cycle and dysphoric mood. The patient is not nervous/anxious.        Objective:   Physical Exam  Constitutional: She is oriented to person, place, and time. She appears well-developed and well-nourished. No distress.  HENT:  Head: Normocephalic and atraumatic.  Right Ear: External ear normal.  Left Ear: External ear normal.  Nose: Nose normal.  Mouth/Throat: Oropharynx is clear and moist. No oropharyngeal exudate.  Eyes: Conjunctivae are normal. Pupils are equal, round, and reactive to light. Right eye exhibits no discharge. Left eye exhibits no  discharge. No scleral icterus.  Neck: Normal range of motion. Neck supple. No tracheal deviation present. No thyromegaly present.  Cardiovascular: Normal rate, regular rhythm, normal heart sounds and intact distal pulses.  Exam reveals no gallop and no friction rub.   No murmur heard. Pulmonary/Chest: Effort normal and breath sounds normal. No respiratory distress. She has no wheezes. She has no rales. She exhibits no tenderness.  Musculoskeletal: She exhibits no edema and no tenderness.       Lumbar back: She exhibits decreased range of motion, tenderness, edema, pain and spasm.       Back:  Lymphadenopathy:    She has no cervical adenopathy.  Neurological: She is alert and oriented to person, place, and time. No cranial nerve deficit. She exhibits normal muscle tone. Coordination normal.  Skin: Skin is warm and dry. No rash noted. She is not diaphoretic. No erythema. No pallor.  Psychiatric: She has a normal mood and affect. Her behavior is normal. Judgment and thought content normal.          Assessment & Plan:

## 2011-12-28 ENCOUNTER — Encounter: Payer: Self-pay | Admitting: Internal Medicine

## 2011-12-28 ENCOUNTER — Ambulatory Visit (INDEPENDENT_AMBULATORY_CARE_PROVIDER_SITE_OTHER): Payer: Medicare Other | Admitting: Internal Medicine

## 2011-12-28 VITALS — BP 120/86 | HR 75 | Temp 98.4°F | Ht 61.0 in | Wt 199.5 lb

## 2011-12-28 DIAGNOSIS — IMO0002 Reserved for concepts with insufficient information to code with codable children: Secondary | ICD-10-CM

## 2011-12-28 DIAGNOSIS — M5416 Radiculopathy, lumbar region: Secondary | ICD-10-CM

## 2011-12-28 DIAGNOSIS — Z23 Encounter for immunization: Secondary | ICD-10-CM

## 2011-12-28 NOTE — Progress Notes (Signed)
Subjective:    Patient ID: Gwendolyn [name], female    DOB: 01/19/1946, 66 y.o.   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 At her last visit, she complained of severe pain and some weakness in her bilateral LE leading to difficulty walking. she was started on Lidoderm patch and Flexeril in addition to her Aleve, tramadol, hydrocodone, Cymbalta, and Lyrica. She reports some improvement with this. She continues to have she'll limit her physical activity because of exacerbation of pain. Pain is described as aching in her lower back which radiates bilaterally. She was seen by neurosurgery this week and she reports that they did not feel comfortable with intervention without having imaging using MRI. She is unable to have MRI because of port in her left breast. She would like to get a second opinion from another neurosurgeon.   Outpatient Encounter Prescriptions as of 12/28/2011  Medication Sig Dispense Refill  . baclofen (LIORESAL) 10 MG tablet One in the morning and two at night  270 each  3  . CALCIUM PO Take 1 tablet by mouth daily.      . cyclobenzaprine (FLEXERIL) 5 MG tablet Take 1 tablet (5 mg total) by mouth 3 (three) times daily as needed for muscle spasms.  90 tablet  1  . DULoxetine (CYMBALTA) 60 MG capsule Take 1 capsule (60 mg total) by mouth daily.  90 capsule  4  . enalapril (VASOTEC) 10 MG tablet Take 1 tablet (10 mg total) by mouth daily.  90 tablet  3  . levothyroxine (SYNTHROID, LEVOTHROID) 50 MCG tablet Take 1 tablet (50 mcg total) by mouth daily.  90 tablet  3  . Multiple Vitamins-Minerals (MULTIVITAMIN WITH MINERALS) tablet Take 1 tablet by mouth daily.      . pantoprazole (PROTONIX) 40 MG tablet Take 1 tablet (40 mg total) by mouth daily.  90 tablet  3  . pregabalin (LYRICA) 150 MG capsule Take 1 capsule (150 mg total) by mouth at bedtime.  90 capsule  3  . rOPINIRole (REQUIP) 2 MG  tablet Take 1 tablet (2 mg total) by mouth 2 (two) times daily.  180 tablet  3  . traMADol (ULTRAM) 50 MG tablet Take 1-2 tablets (50-100 mg total) by mouth every 6 (six) hours as needed.  90 tablet  3  . vitamin E (VITAMIN E) 1000 UNIT capsule Take 1,000 Units by mouth daily.       BP 120/86  Pulse 75  Temp 98.4 F (36.9 C) (Oral)  Ht 5' 1"  (1.549 m)  Wt 199 lb 8 oz (90.493 kg)  BMI 37.70 kg/m2  SpO2 94%  Review of Systems  Constitutional: Negative for fever, chills, appetite change, fatigue and unexpected weight change.  HENT: Negative for ear pain, congestion, sore throat, trouble swallowing, neck pain, voice change and sinus pressure.   Eyes: Negative for visual disturbance.  Respiratory: Negative for cough, shortness of breath, wheezing and stridor.   Cardiovascular: Negative for chest pain, palpitations and leg swelling.  Gastrointestinal: Negative for nausea, vomiting, abdominal pain, diarrhea, constipation, blood in stool, abdominal distention and anal bleeding.  Genitourinary: Negative for dysuria and flank pain.  Musculoskeletal: Positive for myalgias, back pain and arthralgias. Negative for gait problem.  Skin: Negative for color change and rash.  Neurological: Negative for dizziness and headaches.  Hematological: Negative for adenopathy. Does not bruise/bleed easily.  Psychiatric/Behavioral: Negative for suicidal ideas, disturbed wake/sleep cycle and  dysphoric mood. The patient is not nervous/anxious.        Objective:   Physical Exam  Constitutional: She is oriented to person, place, and time. She appears well-developed and well-nourished. No distress.  HENT:  Head: Normocephalic and atraumatic.  Right Ear: External ear normal.  Left Ear: External ear normal.  Nose: Nose normal.  Mouth/Throat: Oropharynx is clear and moist. No oropharyngeal exudate.  Eyes: Conjunctivae normal are normal. Pupils are equal, round, and reactive to light. Right eye exhibits no  discharge. Left eye exhibits no discharge. No scleral icterus.  Neck: Normal range of motion. Neck supple. No tracheal deviation present. No thyromegaly present.  Cardiovascular: Normal rate, regular rhythm, normal heart sounds and intact distal pulses.  Exam reveals no gallop and no friction rub.   No murmur heard. Pulmonary/Chest: Effort normal and breath sounds normal. No respiratory distress. She has no wheezes. She has no rales. She exhibits no tenderness.  Musculoskeletal: She exhibits no edema and no tenderness.       Lumbar back: She exhibits decreased range of motion, tenderness, pain and spasm.  Lymphadenopathy:    She has no cervical adenopathy.  Neurological: She is alert and oriented to person, place, and time. No cranial nerve deficit. She exhibits normal muscle tone. Coordination normal.  Skin: Skin is warm and dry. No rash noted. She is not diaphoretic. No erythema. No pallor.  Psychiatric: She has a normal mood and affect. Her behavior is normal. Judgment and thought content normal.          Assessment & Plan:

## 2011-12-28 NOTE — Assessment & Plan Note (Addendum)
Symptoms improved somewhat with use of flexeril, lidoderm patch, tramadol, aleve, lyrica and cymbalta. Patient was seen by neurosurgery, Dr. Luiz Ochoa, and he recommended monitoring for now. Pt is concerned about risk of repeat exacerbation. 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 Will set up second [facility] referral to Dr. Delilah Shan at [facility].

## 2011-12-28 NOTE — Addendum Note (Signed)
Addended by: Harmon Dun on: 12/28/2011 01:41 PM   Modules accepted: Orders

## 2012-02-04 ENCOUNTER — Encounter: Payer: Self-pay | Admitting: Internal Medicine

## 2012-03-04 ENCOUNTER — Ambulatory Visit: Payer: Medicare Other | Admitting: Internal Medicine

## 2012-03-28 ENCOUNTER — Ambulatory Visit (INDEPENDENT_AMBULATORY_CARE_PROVIDER_SITE_OTHER): Payer: Medicare Other | Admitting: Internal Medicine

## 2012-03-28 ENCOUNTER — Encounter: Payer: Self-pay | Admitting: Internal Medicine

## 2012-03-28 VITALS — BP 114/68 | HR 86 | Temp 97.7°F | Resp 16 | Wt 206.0 lb

## 2012-03-28 DIAGNOSIS — M797 Fibromyalgia: Secondary | ICD-10-CM

## 2012-03-28 DIAGNOSIS — G4700 Insomnia, unspecified: Secondary | ICD-10-CM | POA: Insufficient documentation

## 2012-03-28 DIAGNOSIS — F329 Major depressive disorder, single episode, unspecified: Secondary | ICD-10-CM

## 2012-03-28 DIAGNOSIS — F3289 Other specified depressive episodes: Secondary | ICD-10-CM

## 2012-03-28 DIAGNOSIS — IMO0001 Reserved for inherently not codable concepts without codable children: Secondary | ICD-10-CM

## 2012-03-28 DIAGNOSIS — F32A Depression, unspecified: Secondary | ICD-10-CM

## 2012-03-28 MED ORDER — TRAMADOL HCL 50 MG PO TABS: 50.0000 mg | ORAL_TABLET | Freq: Four times a day (QID) | ORAL | Status: DC | PRN

## 2012-03-28 MED ORDER — ESZOPICLONE 2 MG PO TABS: 2.0000 mg | ORAL_TABLET | Freq: Every day | ORAL | Status: DC

## 2012-03-28 MED ORDER — FLUOXETINE HCL 20 MG PO TABS: 20.0000 mg | ORAL_TABLET | Freq: Every day | ORAL | Status: DC

## 2012-03-28 NOTE — Progress Notes (Signed)
Subjective:    Patient ID: Gwendolyn [name], female    DOB: 10-11-1945, 66 y.o.   MRN: [id number]  HPI 66 year old female with history of breast cancer, hypertension, hypothyroidism, chronic pain presents for acute visit complaining of worsening anxiety/depression after recent hospitalization of her husband. Her husband has been hospitalized multiple times over the last few months for extensive wounds over his abdomen. He is currently hospitalized and patient has been told there is nothing more that he can be for him. She is planning end-of-life care for him. She reports feeling exhausted from going back and forth to the hospital. She does have the assistance of her son. She is currently taking Cymbalta 60 mg daily for about chronic pain and depression but feels that this is inadequate at this point. She is having difficulty sleeping and is sleeping very little. She would like to try some medication to help with sleep.  Outpatient Encounter Prescriptions as of 03/28/2012  Medication Sig Dispense Refill  . baclofen (LIORESAL) 10 MG tablet One in the morning and two at night  270 each  3  . CALCIUM PO Take 1 tablet by mouth daily.      . DULoxetine (CYMBALTA) 60 MG capsule Take 1 capsule (60 mg total) by mouth daily.  90 capsule  4  . enalapril (VASOTEC) 10 MG tablet Take 1 tablet (10 mg total) by mouth daily.  90 tablet  3  . levothyroxine (SYNTHROID, LEVOTHROID) 50 MCG tablet Take 1 tablet (50 mcg total) by mouth daily.  90 tablet  3  . Multiple Vitamins-Minerals (MULTIVITAMIN WITH MINERALS) tablet Take 1 tablet by mouth daily.      . pantoprazole (PROTONIX) 40 MG tablet Take 1 tablet (40 mg total) by mouth daily.  90 tablet  3  . pregabalin (LYRICA) 150 MG capsule Take 1 capsule (150 mg total) by mouth at bedtime.  90 capsule  3  . rOPINIRole (REQUIP) 2 MG tablet Take 1 tablet (2 mg total) by mouth 2 (two) times daily.  180 tablet  3  . traMADol (ULTRAM) 50 MG tablet Take 1-2 tablets (50-100 mg  total) by mouth every 6 (six) hours as needed.  90 tablet  3  . vitamin E (VITAMIN E) 1000 UNIT capsule Take 1,000 Units by mouth daily.      . [DISCONTINUED] traMADol (ULTRAM) 50 MG tablet Take 1-2 tablets (50-100 mg total) by mouth every 6 (six) hours as needed.  90 tablet  3  . eszopiclone (LUNESTA) 2 MG TABS Take 1 tablet (2 mg total) by mouth at bedtime. Take immediately before bedtime  30 tablet  3  . FLUoxetine (PROZAC) 20 MG tablet Take 1 tablet (20 mg total) by mouth daily.  30 tablet  3   BP 114/68  Pulse 86  Temp 97.7 F (36.5 C) (Oral)  Resp 16  Wt 206 lb (93.441 kg)  Review of Systems  Constitutional: Positive for fatigue. Negative for fever, chills, appetite change and unexpected weight change.  HENT: Negative for ear pain, congestion, trouble swallowing, neck pain and sinus pressure.   Eyes: Negative for visual disturbance.  Respiratory: Negative for cough, shortness of breath, wheezing and stridor.   Cardiovascular: Negative for chest pain.  Genitourinary: Negative for dysuria and flank pain.  Musculoskeletal: Negative for myalgias, arthralgias and gait problem.  Skin: Negative for color change and rash.  Neurological: Negative for dizziness and headaches.  Hematological: Negative for adenopathy. Does not bruise/bleed easily.  Psychiatric/Behavioral: Positive for sleep disturbance and dysphoric  mood. Negative for suicidal ideas. The patient is nervous/anxious.        Objective:   Physical Exam  Constitutional: She is oriented to person, place, and time. She appears well-developed and well-nourished. No distress.  HENT:  Head: Normocephalic and atraumatic.  Right Ear: External ear normal.  Left Ear: External ear normal.  Nose: Nose normal.  Mouth/Throat: Oropharynx is clear and moist.  Eyes: Conjunctivae normal are normal. Pupils are equal, round, and reactive to light. Right eye exhibits no discharge. Left eye exhibits no discharge. No scleral icterus.  Neck:  Normal range of motion. Neck supple.  Cardiovascular: Normal rate.   Pulmonary/Chest: Effort normal and breath sounds normal.  Musculoskeletal: Normal range of motion. She exhibits no edema.  Neurological: She is alert and oriented to person, place, and time.  Skin: She is not diaphoretic.  Psychiatric: She has a normal mood and affect. Her behavior is normal. Judgment and thought content normal.          Assessment & Plan:

## 2012-03-28 NOTE — Assessment & Plan Note (Signed)
Will try adding fluoxetine to Cymbalta to help with symptoms of depression. Offered support today. Encouraged her to seek the assistance of her son in preparing end-of-life care for her husband. Followup in 4 weeks or sooner as needed.

## 2012-03-28 NOTE — Assessment & Plan Note (Signed)
Will add Lunesta to help with insomnia. Patient has tolerated this well in the past. Patient will call if any problems at this medication. Encouraged her to set aside 8-10 hours her sleep prior to taking this medication.

## 2012-05-07 ENCOUNTER — Ambulatory Visit (INDEPENDENT_AMBULATORY_CARE_PROVIDER_SITE_OTHER): Payer: Medicare Other | Admitting: Adult Health

## 2012-05-07 ENCOUNTER — Encounter: Payer: Self-pay | Admitting: Adult Health

## 2012-05-07 VITALS — BP 123/83 | HR 85 | Temp 98.1°F | Resp 16 | Ht 62.0 in | Wt 207.0 lb

## 2012-05-07 DIAGNOSIS — J449 Chronic obstructive pulmonary disease, unspecified: Secondary | ICD-10-CM

## 2012-05-07 DIAGNOSIS — M797 Fibromyalgia: Secondary | ICD-10-CM

## 2012-05-07 DIAGNOSIS — IMO0001 Reserved for inherently not codable concepts without codable children: Secondary | ICD-10-CM

## 2012-05-07 MED ORDER — PREGABALIN 150 MG PO CAPS: 150.0000 mg | ORAL_CAPSULE | Freq: Every day | ORAL | Status: DC

## 2012-05-07 MED ORDER — DOXYCYCLINE HYCLATE 100 MG PO TABS: 100.0000 mg | ORAL_TABLET | Freq: Two times a day (BID) | ORAL | Status: DC

## 2012-05-07 MED ORDER — HYDROCODONE-HOMATROPINE 5-1.5 MG/5ML PO SYRP: 5.0000 mL | ORAL_SOLUTION | Freq: Three times a day (TID) | ORAL | Status: DC | PRN

## 2012-05-07 NOTE — Assessment & Plan Note (Signed)
Albuterol nebulizer treatment in clinic. Start doxycycline. Hycodan for cough. RTC if symptoms do not improve within 3-4 days. If no improvement will order chest xray.

## 2012-05-07 NOTE — Progress Notes (Signed)
Subjective:    Patient ID: Gwendolyn [name], female    DOB: Dec 07, 1945, 67 y.o.   MRN: [id number]  HPI  Patient is a very pleasant 67 y/o female who presents to clinic with c/o cough, hoarseness x 1 week. She denies congestion or drainage from sinuses. Reports slight shortness of breath on occasion. She denies fever, chills, chest pain.   Current Outpatient Prescriptions on File Prior to Visit  Medication Sig Dispense Refill  . baclofen (LIORESAL) 10 MG tablet One in the morning and two at night  270 each  3  . CALCIUM PO Take 1 tablet by mouth daily.      . DULoxetine (CYMBALTA) 60 MG capsule Take 1 capsule (60 mg total) by mouth daily.  90 capsule  4  . enalapril (VASOTEC) 10 MG tablet Take 1 tablet (10 mg total) by mouth daily.  90 tablet  3  . eszopiclone (LUNESTA) 2 MG TABS Take 1 tablet (2 mg total) by mouth at bedtime. Take immediately before bedtime  30 tablet  3  . FLUoxetine (PROZAC) 20 MG tablet Take 1 tablet (20 mg total) by mouth daily.  30 tablet  3  . levothyroxine (SYNTHROID, LEVOTHROID) 50 MCG tablet Take 1 tablet (50 mcg total) by mouth daily.  90 tablet  3  . Multiple Vitamins-Minerals (MULTIVITAMIN WITH MINERALS) tablet Take 1 tablet by mouth daily.      . pantoprazole (PROTONIX) 40 MG tablet Take 1 tablet (40 mg total) by mouth daily.  90 tablet  3  . pregabalin (LYRICA) 150 MG capsule Take 1 capsule (150 mg total) by mouth at bedtime.  90 capsule  3  . rOPINIRole (REQUIP) 2 MG tablet Take 1 tablet (2 mg total) by mouth 2 (two) times daily.  180 tablet  3  . traMADol (ULTRAM) 50 MG tablet Take 1-2 tablets (50-100 mg total) by mouth every 6 (six) hours as needed.  90 tablet  3  . vitamin E (VITAMIN E) 1000 UNIT capsule Take 1,000 Units by mouth daily.         Review of Systems  Constitutional: Negative for fever and chills.  HENT: Positive for congestion, voice change and postnasal drip. Negative for sore throat.   Eyes: Negative.   Respiratory: Positive for cough and  wheezing.   Cardiovascular: Negative.   Gastrointestinal: Negative.   Genitourinary: Negative.   Skin: Negative.   Neurological: Negative.   Psychiatric/Behavioral: Negative.     BP 123/83  Pulse 85  Temp 98.1 F (36.7 C) (Oral)  Resp 16  Ht 5' 2"  (1.575 m)  Wt 207 lb (93.895 kg)  BMI 37.86 kg/m2  SpO2 94%     Objective:   Physical Exam  Constitutional: She is oriented to person, place, and time. She appears well-developed and well-nourished. No distress.  HENT:  Head: Normocephalic and atraumatic.  Right Ear: External ear normal.  Left Ear: External ear normal.       Pharyngeal erythema  Eyes: Conjunctivae normal are normal.  Cardiovascular: Normal rate, regular rhythm and normal heart sounds.  Exam reveals no gallop.   No murmur heard. Pulmonary/Chest: Effort normal. She has wheezes. She has no rales. She exhibits no tenderness.       Rhonchi bilateral upper lobes posteriorly. Rhonchi is worse on the left side.  Abdominal: Soft. Bowel sounds are normal.  Musculoskeletal: Normal range of motion.  Lymphadenopathy:    She has no cervical adenopathy.  Neurological: She is alert and oriented to person, place, and  time.  Skin: Skin is warm and dry.  Psychiatric: She has a normal mood and affect. Her behavior is normal. Judgment and thought content normal.       Assessment & Plan:

## 2012-05-07 NOTE — Patient Instructions (Addendum)
  Please start your antibiotic today.  Hycodan syrup for severe cough. This medication can cause drowsiness.  Please do not leave clinic without having an albuterol breathing treatment.  Drink fluids to stay hydrated. Honey may be soothing to your throat and irritated vocal cords from coughing.  You should start to feel better within the next few days. If there is no improvement within 3-4 days, please call.

## 2012-06-19 ENCOUNTER — Ambulatory Visit (INDEPENDENT_AMBULATORY_CARE_PROVIDER_SITE_OTHER): Payer: Medicare Other | Admitting: Internal Medicine

## 2012-06-19 ENCOUNTER — Encounter: Payer: Self-pay | Admitting: Internal Medicine

## 2012-06-19 VITALS — BP 150/104 | HR 86 | Temp 98.2°F | Wt 208.0 lb

## 2012-06-19 DIAGNOSIS — F32A Depression, unspecified: Secondary | ICD-10-CM

## 2012-06-19 DIAGNOSIS — Z634 Disappearance and death of family member: Secondary | ICD-10-CM | POA: Insufficient documentation

## 2012-06-19 DIAGNOSIS — F3289 Other specified depressive episodes: Secondary | ICD-10-CM

## 2012-06-19 DIAGNOSIS — F329 Major depressive disorder, single episode, unspecified: Secondary | ICD-10-CM

## 2012-06-19 DIAGNOSIS — K7581 Nonalcoholic steatohepatitis (NASH): Secondary | ICD-10-CM

## 2012-06-19 DIAGNOSIS — I10 Essential (primary) hypertension: Secondary | ICD-10-CM

## 2012-06-19 DIAGNOSIS — K7689 Other specified diseases of liver: Secondary | ICD-10-CM

## 2012-06-19 MED ORDER — METOPROLOL SUCCINATE ER 25 MG PO TB24: 25.0000 mg | ORAL_TABLET | Freq: Every day | ORAL | Status: DC

## 2012-06-19 NOTE — Assessment & Plan Note (Signed)
Recent eval at [facility] showed normal LFTs and stable CT abdomen showing cirrhosis. Will request visit notes.

## 2012-06-19 NOTE — Assessment & Plan Note (Signed)
Recent worsening of symptoms after husband's death. Continues with counseling. Continues on Cymbalta and Prozac. Offered support today. Will continue to monitor. No suicidal ideation. Follow up 1 month.

## 2012-06-19 NOTE — Progress Notes (Signed)
Subjective:    Patient ID: Gwendolyn [name], female    DOB: 08-Apr-1946, 67 y.o.   MRN: [id number]  HPI 67 year old female with history of fibromyalgia, breast cancer status post left mastectomy, hypertension presents for followup. In the interim since her last visit, her husband passed away after extended hospitalization. She reports increased anxiety and depressed mood. However, she feels that she is coping well. She continues to see a Clinical cytogeneticist and continues on fluoxetine and Cymbalta. She reports strong support from her family. She notes that she is scheduled for left breast reconstruction next week. Over the last few weeks, she has had several doctor's appointments including with her hepatologist. During his visit, she has been noted to have elevated blood pressure, typically 150s over 100. She denies any associated chest pain, headache, palpitations. She reports compliance with her lisinopril. She brings labs showing recent renal function was normal drawn at [facility]. Recent hepatic function was also normal. CT of the abdomen and chest were both stable when performed at [facility].  She continues to have chronic pain in her back, legs and arms. She is scheduled to see orthopedic physician at [facility] in regards to her back pain after breast reconstruction complete.  Outpatient Encounter Prescriptions as of 06/19/2012  Medication Sig Dispense Refill  . baclofen (LIORESAL) 10 MG tablet One in the morning and two at night  270 each  3  . CALCIUM PO Take 1 tablet by mouth daily.      . DULoxetine (CYMBALTA) 60 MG capsule Take 1 capsule (60 mg total) by mouth daily.  90 capsule  4  . enalapril (VASOTEC) 10 MG tablet Take 1 tablet (10 mg total) by mouth daily.  90 tablet  3  . eszopiclone (LUNESTA) 2 MG TABS Take 1 tablet (2 mg total) by mouth at bedtime. Take immediately before bedtime  30 tablet  3  . FLUoxetine (PROZAC) 20 MG tablet Take 1 tablet (20 mg total) by mouth daily.  30 tablet  3  .  levothyroxine (SYNTHROID, LEVOTHROID) 50 MCG tablet Take 1 tablet (50 mcg total) by mouth daily.  90 tablet  3  . Multiple Vitamins-Minerals (MULTIVITAMIN WITH MINERALS) tablet Take 1 tablet by mouth daily.      . pantoprazole (PROTONIX) 40 MG tablet Take 1 tablet (40 mg total) by mouth daily.  90 tablet  3  . pregabalin (LYRICA) 150 MG capsule Take 1 capsule (150 mg total) by mouth at bedtime.  90 capsule  3  . rOPINIRole (REQUIP) 2 MG tablet Take 1 tablet (2 mg total) by mouth 2 (two) times daily.  180 tablet  3  . traMADol (ULTRAM) 50 MG tablet Take 1-2 tablets (50-100 mg total) by mouth every 6 (six) hours as needed.  90 tablet  3  . vitamin E (VITAMIN E) 1000 UNIT capsule Take 1,000 Units by mouth daily.      Marland Kitchen HYDROcodone-homatropine (HYCODAN) 5-1.5 MG/5ML syrup Take 5 mLs by mouth every 8 (eight) hours as needed for cough.  120 mL  0  . metoprolol succinate (TOPROL-XL) 25 MG 24 hr tablet Take 1 tablet (25 mg total) by mouth daily.  90 tablet  3  . [DISCONTINUED] doxycycline (VIBRA-TABS) 100 MG tablet Take 1 tablet (100 mg total) by mouth 2 (two) times daily.  20 tablet  0   No facility-administered encounter medications on file as of 06/19/2012.   BP 150/104  Pulse 86  Temp(Src) 98.2 F (36.8 C) (Oral)  Wt 208 lb (94.348  kg)  BMI 38.03 kg/m2  SpO2 96%  Review of Systems  Constitutional: Negative for fever, chills, appetite change, fatigue and unexpected weight change.  HENT: Negative for ear pain, congestion, sore throat, trouble swallowing, neck pain, voice change and sinus pressure.   Eyes: Negative for visual disturbance.  Respiratory: Negative for cough, shortness of breath, wheezing and stridor.   Cardiovascular: Negative for chest pain, palpitations and leg swelling.  Gastrointestinal: Negative for nausea, vomiting, abdominal pain, diarrhea, constipation, blood in stool, abdominal distention and anal bleeding.  Genitourinary: Negative for dysuria and flank pain.   Musculoskeletal: Positive for myalgias and arthralgias. Negative for gait problem.  Skin: Negative for color change and rash.  Neurological: Negative for dizziness and headaches.  Hematological: Negative for adenopathy. Does not bruise/bleed easily.  Psychiatric/Behavioral: Positive for dysphoric mood. Negative for suicidal ideas and sleep disturbance. The patient is not nervous/anxious.        Objective:   Physical Exam  Constitutional: She is oriented to person, place, and time. She appears well-developed and well-nourished. No distress.  HENT:  Head: Normocephalic and atraumatic.  Right Ear: External ear normal.  Left Ear: External ear normal.  Nose: Nose normal.  Mouth/Throat: Oropharynx is clear and moist. No oropharyngeal exudate.  Eyes: Conjunctivae are normal. Pupils are equal, round, and reactive to light. Right eye exhibits no discharge. Left eye exhibits no discharge. No scleral icterus.  Neck: Normal range of motion. Neck supple. No tracheal deviation present. No thyromegaly present.  Cardiovascular: Normal rate, regular rhythm, normal heart sounds and intact distal pulses.  Exam reveals no gallop and no friction rub.   No murmur heard. Pulmonary/Chest: Effort normal and breath sounds normal. No respiratory distress. She has no wheezes. She has no rales. She exhibits no tenderness.  Musculoskeletal: Normal range of motion. She exhibits no edema and no tenderness.  Lymphadenopathy:    She has no cervical adenopathy.  Neurological: She is alert and oriented to person, place, and time. No cranial nerve deficit. She exhibits normal muscle tone. Coordination normal.  Skin: Skin is warm and dry. No rash noted. She is not diaphoretic. No erythema. No pallor.  Psychiatric: Her speech is normal and behavior is normal. Judgment and thought content normal. Cognition and memory are normal. She exhibits a depressed mood. She expresses no suicidal ideation. She expresses no suicidal plans.           Assessment & Plan:

## 2012-06-19 NOTE — Assessment & Plan Note (Signed)
Pt husband recently deceased. Offered support today. Pt has counseling in place. Will continue to monitor.

## 2012-06-19 NOTE — Assessment & Plan Note (Signed)
BP Readings from Last 3 Encounters:  06/19/12 150/104  05/07/12 123/83  03/28/12 114/68   BP elevated today and with other providers. Will add Metoprolol XL 78m daily given upcoming surgery next week. Plan to recheck BP Friday and follow up in 1 month.

## 2012-06-21 ENCOUNTER — Ambulatory Visit: Payer: Medicare Other

## 2012-07-05 ENCOUNTER — Encounter: Payer: Self-pay | Admitting: Internal Medicine

## 2012-07-22 ENCOUNTER — Ambulatory Visit (INDEPENDENT_AMBULATORY_CARE_PROVIDER_SITE_OTHER): Payer: Medicare Other | Admitting: Internal Medicine

## 2012-07-22 ENCOUNTER — Encounter: Payer: Self-pay | Admitting: Internal Medicine

## 2012-07-22 VITALS — BP 122/82 | HR 69 | Temp 98.5°F | Wt 203.0 lb

## 2012-07-22 DIAGNOSIS — IMO0001 Reserved for inherently not codable concepts without codable children: Secondary | ICD-10-CM

## 2012-07-22 DIAGNOSIS — Z634 Disappearance and death of family member: Secondary | ICD-10-CM

## 2012-07-22 DIAGNOSIS — S21009A Unspecified open wound of unspecified breast, initial encounter: Secondary | ICD-10-CM

## 2012-07-22 DIAGNOSIS — F329 Major depressive disorder, single episode, unspecified: Secondary | ICD-10-CM

## 2012-07-22 DIAGNOSIS — R9389 Abnormal findings on diagnostic imaging of other specified body structures: Secondary | ICD-10-CM | POA: Insufficient documentation

## 2012-07-22 DIAGNOSIS — F3289 Other specified depressive episodes: Secondary | ICD-10-CM

## 2012-07-22 DIAGNOSIS — F32A Depression, unspecified: Secondary | ICD-10-CM

## 2012-07-22 DIAGNOSIS — M797 Fibromyalgia: Secondary | ICD-10-CM | POA: Insufficient documentation

## 2012-07-22 DIAGNOSIS — S21001A Unspecified open wound of right breast, initial encounter: Secondary | ICD-10-CM

## 2012-07-22 DIAGNOSIS — R918 Other nonspecific abnormal finding of lung field: Secondary | ICD-10-CM

## 2012-07-22 DIAGNOSIS — I10 Essential (primary) hypertension: Secondary | ICD-10-CM

## 2012-07-22 MED ORDER — FLUOXETINE HCL 20 MG PO TABS: 20.0000 mg | ORAL_TABLET | Freq: Every day | ORAL | Status: DC

## 2012-07-22 MED ORDER — TRAMADOL HCL 50 MG PO TABS: 50.0000 mg | ORAL_TABLET | Freq: Four times a day (QID) | ORAL | Status: DC | PRN

## 2012-07-22 NOTE — Assessment & Plan Note (Signed)
BP Readings from Last 3 Encounters:  07/22/12 122/82  06/19/12 150/104  05/07/12 123/83   Blood pressure well-controlled on current medication. Will continue.

## 2012-07-22 NOTE — Assessment & Plan Note (Signed)
Symptoms well controlled on current medications. Will continue.

## 2012-07-22 NOTE — Progress Notes (Signed)
Subjective:    Patient ID: Gwendolyn [name], female    DOB: 05/08/1945, 67 y.o.   MRN: [id number]  HPI 67 year old female with history of fibromyalgia, hypertension, depression, breast cancer status post recent reconstructive surgery presents for followup. She reports she is generally doing well. She notes she underwent counseling in the interim since her last visit to help with bereavement after the death of her husband. She feels that she is doing well. In regards to recent breast reconstruction, she notes today an area on her) (training clear fluid. She has followup with her surgeon this afternoon. She denies any fever or chills. She denies any ongoing pain in her chest wall.  Outpatient Encounter Prescriptions as of 07/22/2012  Medication Sig Dispense Refill  . CALCIUM PO Take 1 tablet by mouth daily.      . DULoxetine (CYMBALTA) 60 MG capsule Take 1 capsule (60 mg total) by mouth daily.  90 capsule  4  . enalapril (VASOTEC) 10 MG tablet Take 1 tablet (10 mg total) by mouth daily.  90 tablet  3  . eszopiclone (LUNESTA) 2 MG TABS Take 1 tablet (2 mg total) by mouth at bedtime. Take immediately before bedtime  30 tablet  3  . levothyroxine (SYNTHROID, LEVOTHROID) 50 MCG tablet Take 1 tablet (50 mcg total) by mouth daily.  90 tablet  3  . metoprolol succinate (TOPROL-XL) 25 MG 24 hr tablet Take 1 tablet (25 mg total) by mouth daily.  90 tablet  3  . Multiple Vitamins-Minerals (MULTIVITAMIN WITH MINERALS) tablet Take 1 tablet by mouth daily.      . pantoprazole (PROTONIX) 40 MG tablet Take 1 tablet (40 mg total) by mouth daily.  90 tablet  3  . pregabalin (LYRICA) 150 MG capsule Take 1 capsule (150 mg total) by mouth at bedtime.  90 capsule  3  . rOPINIRole (REQUIP) 2 MG tablet Take 1 tablet (2 mg total) by mouth 2 (two) times daily.  180 tablet  3  . vitamin E (VITAMIN E) 1000 UNIT capsule Take 1,000 Units by mouth daily.      . baclofen (LIORESAL) 10 MG tablet One in the morning and two at night   270 each  3  . FLUoxetine (PROZAC) 20 MG tablet Take 1 tablet (20 mg total) by mouth daily.  90 tablet  3  . HYDROcodone-homatropine (HYCODAN) 5-1.5 MG/5ML syrup Take 5 mLs by mouth every 8 (eight) hours as needed for cough.  120 mL  0  . traMADol (ULTRAM) 50 MG tablet Take 1-2 tablets (50-100 mg total) by mouth every 6 (six) hours as needed.  90 tablet  3   No facility-administered encounter medications on file as of 07/22/2012.   BP 122/82  Pulse 69  Temp(Src) 98.5 F (36.9 C) (Oral)  Wt 203 lb (92.08 kg)  BMI 37.12 kg/m2  SpO2 97%   Review of Systems  Constitutional: Negative for fever, chills, appetite change, fatigue and unexpected weight change.  HENT: Negative for ear pain, congestion, sore throat, trouble swallowing, neck pain, voice change and sinus pressure.   Eyes: Negative for visual disturbance.  Respiratory: Negative for cough, shortness of breath, wheezing and stridor.   Cardiovascular: Negative for chest pain, palpitations and leg swelling.  Gastrointestinal: Negative for nausea, vomiting, abdominal pain, diarrhea, constipation, blood in stool, abdominal distention and anal bleeding.  Genitourinary: Negative for dysuria and flank pain.  Musculoskeletal: Negative for myalgias, arthralgias and gait problem.  Skin: Negative for color change and rash.  Neurological:  Negative for dizziness and headaches.  Hematological: Negative for adenopathy. Does not bruise/bleed easily.  Psychiatric/Behavioral: Negative for suicidal ideas, sleep disturbance and dysphoric mood. The patient is not nervous/anxious.        Objective:   Physical Exam  Constitutional: She is oriented to person, place, and time. She appears well-developed and well-nourished. No distress.  HENT:  Head: Normocephalic and atraumatic.  Right Ear: External ear normal.  Left Ear: External ear normal.  Nose: Nose normal.  Mouth/Throat: Oropharynx is clear and moist. No oropharyngeal exudate.  Eyes:  Conjunctivae are normal. Pupils are equal, round, and reactive to light. Right eye exhibits no discharge. Left eye exhibits no discharge. No scleral icterus.  Neck: Normal range of motion. Neck supple. No tracheal deviation present. No thyromegaly present.  Cardiovascular: Normal rate, regular rhythm, normal heart sounds and intact distal pulses.  Exam reveals no gallop and no friction rub.   No murmur heard. Pulmonary/Chest: Effort normal and breath sounds normal. No respiratory distress. She has no wheezes. She has no rales. She exhibits no tenderness. Right breast exhibits skin change.    Musculoskeletal: Normal range of motion. She exhibits no edema and no tenderness.  Lymphadenopathy:    She has no cervical adenopathy.  Neurological: She is alert and oriented to person, place, and time. No cranial nerve deficit. She exhibits normal muscle tone. Coordination normal.  Skin: Skin is warm and dry. No rash noted. She is not diaphoretic. No erythema. No pallor.  Psychiatric: She has a normal mood and affect. Her behavior is normal. Judgment and thought content normal.          Assessment & Plan:

## 2012-07-22 NOTE — Assessment & Plan Note (Signed)
Small wound right breast after breast reconstruction. Encouraged her to followup with her plastic surgeon as scheduled today. Discussed that wound will heal with secondary intention. No signs of infection on exam today. Continue dry dressing.

## 2012-07-22 NOTE — Assessment & Plan Note (Signed)
Plan to repeat CT of the chest in September 2014. Recent CT chest showed stability in previously seen nodule.

## 2012-09-12 ENCOUNTER — Encounter: Payer: Self-pay | Admitting: Adult Health

## 2012-09-12 ENCOUNTER — Ambulatory Visit (INDEPENDENT_AMBULATORY_CARE_PROVIDER_SITE_OTHER): Payer: Medicare Other | Admitting: Adult Health

## 2012-09-12 VITALS — BP 108/70 | HR 70 | Resp 12 | Wt 202.5 lb

## 2012-09-12 DIAGNOSIS — I10 Essential (primary) hypertension: Secondary | ICD-10-CM

## 2012-09-12 DIAGNOSIS — IMO0001 Reserved for inherently not codable concepts without codable children: Secondary | ICD-10-CM

## 2012-09-12 DIAGNOSIS — M797 Fibromyalgia: Secondary | ICD-10-CM

## 2012-09-12 DIAGNOSIS — F32A Depression, unspecified: Secondary | ICD-10-CM

## 2012-09-12 DIAGNOSIS — F329 Major depressive disorder, single episode, unspecified: Secondary | ICD-10-CM

## 2012-09-12 DIAGNOSIS — F3289 Other specified depressive episodes: Secondary | ICD-10-CM

## 2012-09-12 DIAGNOSIS — G4700 Insomnia, unspecified: Secondary | ICD-10-CM

## 2012-09-12 DIAGNOSIS — M5416 Radiculopathy, lumbar region: Secondary | ICD-10-CM

## 2012-09-12 DIAGNOSIS — IMO0002 Reserved for concepts with insufficient information to code with codable children: Secondary | ICD-10-CM

## 2012-09-12 MED ORDER — PREDNISONE (PAK) 10 MG PO TABS: ORAL_TABLET | ORAL | Status: AC

## 2012-09-12 MED ORDER — PREGABALIN 150 MG PO CAPS: 150.0000 mg | ORAL_CAPSULE | Freq: Every day | ORAL | Status: DC

## 2012-09-12 MED ORDER — METOPROLOL SUCCINATE ER 25 MG PO TB24: 25.0000 mg | ORAL_TABLET | Freq: Every day | ORAL | Status: DC

## 2012-09-12 MED ORDER — ESZOPICLONE 2 MG PO TABS: 2.0000 mg | ORAL_TABLET | Freq: Every day | ORAL | Status: DC

## 2012-09-12 MED ORDER — DULOXETINE HCL 60 MG PO CPEP: 60.0000 mg | ORAL_CAPSULE | Freq: Every day | ORAL | Status: DC

## 2012-09-12 NOTE — Patient Instructions (Addendum)
  Take the prednisone taper as follows:   Take 60 mg (6 tablets) on the first day and decrease by 10 mg (1 tablet) daily. I have given you enough tablets to have in the event you  Need to repeat this taper while on your trip. Remember to stay out of the sun. Use hats, long sleeves and sun block.  Continue your muscle relaxer and Tramadol.  Use ice alternating with heat to the affected area 3-4 times daily.  Stretching exercises as instructed will also help with your symptoms.  Have a great trip!!

## 2012-09-12 NOTE — Progress Notes (Signed)
Pt requesting 90 day refills to be printed for her to mail to [place].

## 2012-09-12 NOTE — Assessment & Plan Note (Signed)
Start prednisone taper. Continue tramadol and baclofen. I have provided patient with extra prednisone to complete a second taper should her symptoms flare while on her trip to the [place]

## 2012-09-12 NOTE — Progress Notes (Signed)
  Subjective:    Patient ID: Gwendolyn [name], female    DOB: 10/15/1945, 66 y.o.   MRN: [id number]  HPI  Patient is a pleasant 67 y/o female who presents to clinic with right low back pain that radiates down buttock, down thigh. Patient has had this problem in the past. She reports that when she was "really bad" they have prescribed a steroid taper and had done well. She is getting ready to go on a [place] cruise and would like to be able to enjoy her time there. She also has an upcoming appoint with Dr. Delene Loll at [facility] but this will be when she returns from her trip. She takes tramadol and baclofen which help her pain. She recently returned from a trip to [place] which is when her symptoms flared.   Current Outpatient Prescriptions on File Prior to Visit  Medication Sig Dispense Refill  . baclofen (LIORESAL) 10 MG tablet One in the morning and two at night  270 each  3  . CALCIUM PO Take 1 tablet by mouth daily.      . enalapril (VASOTEC) 10 MG tablet Take 1 tablet (10 mg total) by mouth daily.  90 tablet  3  . FLUoxetine (PROZAC) 20 MG tablet Take 1 tablet (20 mg total) by mouth daily.  90 tablet  3  . levothyroxine (SYNTHROID, LEVOTHROID) 50 MCG tablet Take 1 tablet (50 mcg total) by mouth daily.  90 tablet  3  . Multiple Vitamins-Minerals (MULTIVITAMIN WITH MINERALS) tablet Take 1 tablet by mouth daily.      . pantoprazole (PROTONIX) 40 MG tablet Take 1 tablet (40 mg total) by mouth daily.  90 tablet  3  . rOPINIRole (REQUIP) 2 MG tablet Take 1 tablet (2 mg total) by mouth 2 (two) times daily.  180 tablet  3  . traMADol (ULTRAM) 50 MG tablet Take 1-2 tablets (50-100 mg total) by mouth every 6 (six) hours as needed.  90 tablet  3  . vitamin E (VITAMIN E) 1000 UNIT capsule Take 1,000 Units by mouth daily.       No current facility-administered medications on file prior to visit.     Review of Systems  Constitutional: Negative for fever and chills.  Respiratory: Negative.    Cardiovascular: Negative.   Musculoskeletal: Positive for back pain. Negative for joint swelling and gait problem.       Pain radiates down right buttock and thigh  Neurological: Negative for weakness and numbness.  Psychiatric/Behavioral: Negative.     BP 108/70  Pulse 70  Resp 12  Wt 202 lb 8 oz (91.853 kg)  BMI 37.03 kg/m2  SpO2 99%    Objective:   Physical Exam  Constitutional: She is oriented to person, place, and time.  Overweight female in NAD  Cardiovascular: Normal rate and regular rhythm.   Pulmonary/Chest: Effort normal. No respiratory distress.  Musculoskeletal: Normal range of motion. She exhibits no edema and no tenderness.  Neurological: She is alert and oriented to person, place, and time. No cranial nerve deficit. Coordination normal.  Skin: Skin is warm and dry.  Psychiatric: She has a normal mood and affect. Her behavior is normal. Judgment and thought content normal.       Assessment & Plan:

## 2012-11-12 ENCOUNTER — Ambulatory Visit (INDEPENDENT_AMBULATORY_CARE_PROVIDER_SITE_OTHER): Payer: Medicare Other | Admitting: Adult Health

## 2012-11-12 ENCOUNTER — Encounter: Payer: Self-pay | Admitting: Adult Health

## 2012-11-12 VITALS — BP 110/72 | HR 88 | Temp 98.1°F | Resp 12 | Wt 207.5 lb

## 2012-11-12 DIAGNOSIS — R300 Dysuria: Secondary | ICD-10-CM | POA: Insufficient documentation

## 2012-11-12 LAB — POCT URINALYSIS DIPSTICK
Ketones, UA: NEGATIVE
Leukocytes, UA: NEGATIVE
Protein, UA: NEGATIVE
pH, UA: 5.5

## 2012-11-12 MED ORDER — CIPROFLOXACIN HCL 250 MG PO TABS: 250.0000 mg | ORAL_TABLET | Freq: Two times a day (BID) | ORAL | Status: DC

## 2012-11-12 NOTE — Progress Notes (Signed)
  Subjective:    Patient ID: Gwendolyn [name], female    DOB: 1945/12/20, 67 y.o.   MRN: [id number]  HPI  Patient presents with frequency, chills, low grade temp. She report "I just don't feel well". She has taken tylenol for the chills and low grade fever. Symptoms began last Friday.   Current Outpatient Prescriptions on File Prior to Visit  Medication Sig Dispense Refill  . baclofen (LIORESAL) 10 MG tablet One in the morning and two at night  270 each  3  . CALCIUM PO Take 1 tablet by mouth daily.      . DULoxetine (CYMBALTA) 60 MG capsule Take 1 capsule (60 mg total) by mouth daily.  90 capsule  1  . enalapril (VASOTEC) 10 MG tablet Take 1 tablet (10 mg total) by mouth daily.  90 tablet  3  . eszopiclone (LUNESTA) 2 MG TABS Take 1 tablet (2 mg total) by mouth at bedtime. Take immediately before bedtime  30 tablet  3  . FLUoxetine (PROZAC) 20 MG tablet Take 1 tablet (20 mg total) by mouth daily.  90 tablet  3  . levothyroxine (SYNTHROID, LEVOTHROID) 50 MCG tablet Take 1 tablet (50 mcg total) by mouth daily.  90 tablet  3  . metoprolol succinate (TOPROL-XL) 25 MG 24 hr tablet Take 1 tablet (25 mg total) by mouth daily.  90 tablet  1  . Multiple Vitamins-Minerals (MULTIVITAMIN WITH MINERALS) tablet Take 1 tablet by mouth daily.      . pantoprazole (PROTONIX) 40 MG tablet Take 1 tablet (40 mg total) by mouth daily.  90 tablet  3  . pregabalin (LYRICA) 150 MG capsule Take 1 capsule (150 mg total) by mouth at bedtime.  90 capsule  1  . rOPINIRole (REQUIP) 2 MG tablet Take 1 tablet (2 mg total) by mouth 2 (two) times daily.  180 tablet  3  . traMADol (ULTRAM) 50 MG tablet Take 1-2 tablets (50-100 mg total) by mouth every 6 (six) hours as needed.  90 tablet  3  . vitamin E (VITAMIN E) 1000 UNIT capsule Take 1,000 Units by mouth daily.       No current facility-administered medications on file prior to visit.    Review of Systems  Constitutional: Positive for fever and chills.  Genitourinary:  Positive for dysuria, urgency and frequency. Negative for hematuria, flank pain and difficulty urinating.     BP 110/72  Pulse 88  Temp(Src) 98.1 F (36.7 C) (Oral)  Resp 12  Wt 207 lb 8 oz (94.121 kg)  BMI 37.94 kg/m2  SpO2 96%    Objective:   Physical Exam  Constitutional: She is oriented to person, place, and time.  Overweight, pleasant female in no apparent distress  Cardiovascular: Normal rate and regular rhythm.   Pulmonary/Chest: Effort normal. No respiratory distress.  Genitourinary:  No suprapubic tenderness  Neurological: She is alert and oriented to person, place, and time.  Psychiatric: She has a normal mood and affect. Her behavior is normal. Judgment and thought content normal.      Assessment & Plan:

## 2012-11-12 NOTE — Assessment & Plan Note (Signed)
Start Cipro 250 mg twice a day x3 days. Send urine for culture.

## 2012-11-12 NOTE — Patient Instructions (Addendum)
  I am treating you or a urinary tract infection.  Please start Cipro 250 mg twice daily for 3 days.   Urinary Tract Infection Urinary tract infections (UTIs) can develop anywhere along your urinary tract. Your urinary tract is your body's drainage system for removing wastes and extra water. Your urinary tract includes two kidneys, two ureters, a bladder, and a urethra. Your kidneys are a pair of bean-shaped organs. Each kidney is about the size of your fist. They are located below your ribs, one on each side of your spine. CAUSES Infections are caused by microbes, which are microscopic organisms, including fungi, viruses, and bacteria. These organisms are so small that they can only be seen through a microscope. Bacteria are the microbes that most commonly cause UTIs. SYMPTOMS  Symptoms of UTIs may vary by age and gender of the patient and by the location of the infection. Symptoms in young women typically include a frequent and intense urge to urinate and a painful, burning feeling in the bladder or urethra during urination. Older women and men are more likely to be tired, shaky, and weak and have muscle aches and abdominal pain. A fever may mean the infection is in your kidneys. Other symptoms of a kidney infection include pain in your back or sides below the ribs, nausea, and vomiting. DIAGNOSIS To diagnose a UTI, your caregiver will ask you about your symptoms. Your caregiver also will ask to provide a urine sample. The urine sample will be tested for bacteria and white blood cells. White blood cells are made by your body to help fight infection. TREATMENT  Typically, UTIs can be treated with medication. Because most UTIs are caused by a bacterial infection, they usually can be treated with the use of antibiotics. The choice of antibiotic and length of treatment depend on your symptoms and the type of bacteria causing your infection. HOME CARE INSTRUCTIONS  If you were prescribed antibiotics,  take them exactly as your caregiver instructs you. Finish the medication even if you feel better after you have only taken some of the medication.  Drink enough water and fluids to keep your urine clear or pale yellow.  Avoid caffeine, tea, and carbonated beverages. They tend to irritate your bladder.  Empty your bladder often. Avoid holding urine for long periods of time.  Empty your bladder before and after sexual intercourse.  After a bowel movement, women should cleanse from front to back. Use each tissue only once. SEEK MEDICAL CARE IF:   You have back pain.  You develop a fever.  Your symptoms do not begin to resolve within 3 days. SEEK IMMEDIATE MEDICAL CARE IF:   You have severe back pain or lower abdominal pain.  You develop chills.  You have nausea or vomiting.  You have continued burning or discomfort with urination. MAKE SURE YOU:   Understand these instructions.  Will watch your condition.  Will get help right away if you are not doing well or get worse. Document Released: 01/11/2005 Document Revised: 10/03/2011 Document Reviewed: 05/12/2011 [facility] Patient Information 2014 [place].

## 2012-11-14 ENCOUNTER — Other Ambulatory Visit: Payer: Self-pay | Admitting: Adult Health

## 2012-11-14 DIAGNOSIS — R3129 Other microscopic hematuria: Secondary | ICD-10-CM

## 2013-01-15 ENCOUNTER — Encounter: Payer: Self-pay | Admitting: *Deleted

## 2013-01-16 ENCOUNTER — Telehealth: Payer: Self-pay | Admitting: *Deleted

## 2013-01-16 ENCOUNTER — Ambulatory Visit (INDEPENDENT_AMBULATORY_CARE_PROVIDER_SITE_OTHER)
Admission: RE | Admit: 2013-01-16 | Discharge: 2013-01-16 | Disposition: A | Discharge status: Home / Self Care | Payer: Medicare Other | Source: Ambulatory Visit | Attending: Internal Medicine | Admitting: Internal Medicine

## 2013-01-16 ENCOUNTER — Ambulatory Visit (INDEPENDENT_AMBULATORY_CARE_PROVIDER_SITE_OTHER): Payer: Medicare Other | Admitting: Internal Medicine

## 2013-01-16 ENCOUNTER — Encounter: Payer: Self-pay | Admitting: Internal Medicine

## 2013-01-16 VITALS — BP 120/84 | HR 90 | Temp 97.7°F | Wt 199.0 lb

## 2013-01-16 DIAGNOSIS — R002 Palpitations: Secondary | ICD-10-CM

## 2013-01-16 DIAGNOSIS — Z23 Encounter for immunization: Secondary | ICD-10-CM

## 2013-01-16 DIAGNOSIS — D72819 Decreased white blood cell count, unspecified: Secondary | ICD-10-CM

## 2013-01-16 DIAGNOSIS — R61 Generalized hyperhidrosis: Secondary | ICD-10-CM

## 2013-01-16 DIAGNOSIS — K7689 Other specified diseases of liver: Secondary | ICD-10-CM

## 2013-01-16 DIAGNOSIS — R251 Tremor, unspecified: Secondary | ICD-10-CM | POA: Insufficient documentation

## 2013-01-16 DIAGNOSIS — M545 Low back pain, unspecified: Secondary | ICD-10-CM

## 2013-01-16 DIAGNOSIS — R259 Unspecified abnormal involuntary movements: Secondary | ICD-10-CM

## 2013-01-16 DIAGNOSIS — K7581 Nonalcoholic steatohepatitis (NASH): Secondary | ICD-10-CM

## 2013-01-16 LAB — SEDIMENTATION RATE: Sed Rate: 21 mm/hr (ref 0–22)

## 2013-01-16 MED ORDER — BACLOFEN 10 MG PO TABS: ORAL_TABLET | ORAL | Status: DC

## 2013-01-16 NOTE — Assessment & Plan Note (Signed)
New on set of intention tremor per pt report. No tremor noted on exam today. Will have pt keep log of symptoms. Will check thyroid function with labs. Discussed possible referral to neurology if symptoms persistent and labs normal.

## 2013-01-16 NOTE — Assessment & Plan Note (Signed)
Intermittent episodes of rapid heart beats with hot flashes. Will check thyroid function with labs. EKG normal today. If labs are normal, we discussed possible referral to cardiology for holter monitor.

## 2013-01-16 NOTE — Assessment & Plan Note (Signed)
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 Reviewed notes from Dr. Tiana Loft at [facility], who recommended thoraolumbarpelvic fusion. Per pt, physician at [facility] was in agreement with this. Pt has decided to hold off on surgical intervention at this time. She will continue to follow with [facility] pain management. Pain currently well controlled with narcotic patch. Will continue to monitor.

## 2013-01-16 NOTE — Assessment & Plan Note (Signed)
Reviewed recent notes, labs and [place] from Dr. Gerald Dexter at [facility]. Planned follow up at [facility] in 6 months.

## 2013-01-16 NOTE — Telephone Encounter (Signed)
[facility] called they would like a re-collect on the pt cbc, called patient left a voice mail  FYI

## 2013-01-16 NOTE — Assessment & Plan Note (Signed)
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 Will repeat CBC with labs today.

## 2013-01-16 NOTE — Progress Notes (Signed)
Subjective:    Patient ID: Gwendolyn [name], female    DOB: 1945/09/27, 67 y.o.   MRN: [id number]  HPI 67YO female with h/o breast cancer, hypothyroidism, hypertension, NASH, DJD of the lumbar spine presents for follow up. She has several concerns today.  DJD spine/low back pain - Symptoms severe with limitation in physical activity because of severe, persistent low back pain. Recently seen by [facility] at [facility].Notes reviewed today during visit though Epic. Recommended thoracolumbarpelvic fusion. Pt has decided to hold on surgery and pursue pain management for now. Started on Butrans patch. Typical pain level near 3/10. She finds this manageable. Questions possible referral to [facility].  NASH - recently seen at [facility] by Dr. Gerald Dexter. Labs and US abdomen normal except for low WBC. Pt is concerned about this. Reports no further action was taken from GI physician. Concerned as she has had recent fatigue, night sweats, described as drenching. Has rapid heart beats during these episodes. No fever, chills, change in weight. No LAD.  Also concerned about recent tremor in her hands when she is reaching for an object or writing. No tremor at rest. This has been present for a few weeks.  Outpatient Encounter Prescriptions as of 01/16/2013  Medication Sig Dispense Refill  . baclofen (LIORESAL) 10 MG tablet One in the morning and two at night  270 each  3  . buprenorphine (BUTRANS) 20 MCG/HR PTWK patch Place 20 mcg onto the skin once a week.      Marland Kitchen CALCIUM PO Take 1 tablet by mouth daily.      . DULoxetine (CYMBALTA) 60 MG capsule Take 1 capsule (60 mg total) by mouth daily.  90 capsule  1  . enalapril (VASOTEC) 10 MG tablet Take 1 tablet (10 mg total) by mouth daily.  90 tablet  3  . FLUoxetine (PROZAC) 20 MG tablet Take 1 tablet (20 mg total) by mouth daily.  90 tablet  3  . levothyroxine (SYNTHROID, LEVOTHROID) 50 MCG tablet Take 1 tablet (50 mcg total) by mouth daily.  90 tablet  3  . metoprolol succinate  (TOPROL-XL) 25 MG 24 hr tablet Take 1 tablet (25 mg total) by mouth daily.  90 tablet  1  . Multiple Vitamins-Minerals (MULTIVITAMIN WITH MINERALS) tablet Take 1 tablet by mouth daily.      . pantoprazole (PROTONIX) 40 MG tablet Take 1 tablet (40 mg total) by mouth daily.  90 tablet  3  . rOPINIRole (REQUIP) 2 MG tablet Take 1 tablet (2 mg total) by mouth 2 (two) times daily.  180 tablet  3  . vitamin E (VITAMIN E) 1000 UNIT capsule Take 1,000 Units by mouth daily.      . eszopiclone (LUNESTA) 2 MG TABS Take 1 tablet (2 mg total) by mouth at bedtime. Take immediately before bedtime  30 tablet  3  . pregabalin (LYRICA) 150 MG capsule Take 1 capsule (150 mg total) by mouth at bedtime.  90 capsule  1  . traMADol (ULTRAM) 50 MG tablet Take 1-2 tablets (50-100 mg total) by mouth every 6 (six) hours as needed.  90 tablet  3   No facility-administered encounter medications on file as of 01/16/2013.   BP 120/84  Pulse 90  Temp(Src) 97.7 F (36.5 C) (Oral)  Wt 199 lb (90.266 kg)  BMI 36.39 kg/m2  SpO2 95%  Review of Systems  Constitutional: Positive for diaphoresis and fatigue. Negative for fever, chills, appetite change and unexpected weight change.  HENT: Negative for ear  pain, congestion, sore throat, trouble swallowing, neck pain, voice change and sinus pressure.   Eyes: Negative for visual disturbance.  Respiratory: Negative for cough, shortness of breath, wheezing and stridor.   Cardiovascular: Positive for palpitations. Negative for chest pain and leg swelling.  Gastrointestinal: Negative for nausea, vomiting, abdominal pain, diarrhea, constipation, blood in stool, abdominal distention and anal bleeding.  Genitourinary: Negative for dysuria and flank pain.  Musculoskeletal: Positive for back pain and arthralgias. Negative for myalgias and gait problem.  Skin: Negative for color change and rash.  Neurological: Positive for tremors. Negative for dizziness and headaches.  Hematological:  Negative for adenopathy. Does not bruise/bleed easily.  Psychiatric/Behavioral: Negative for suicidal ideas, sleep disturbance and dysphoric mood. The patient is not nervous/anxious.        Objective:   Physical Exam  Constitutional: She is oriented to person, place, and time. She appears well-developed and well-nourished. No distress.  HENT:  Head: Normocephalic and atraumatic.  Right Ear: External ear normal.  Left Ear: External ear normal.  Nose: Nose normal.  Mouth/Throat: Oropharynx is clear and moist. No oropharyngeal exudate.  Eyes: Conjunctivae are normal. Pupils are equal, round, and reactive to light. Right eye exhibits no discharge. Left eye exhibits no discharge. No scleral icterus.  Neck: Normal range of motion. Neck supple. No tracheal deviation present. No thyromegaly present.  Cardiovascular: Normal rate, regular rhythm, normal heart sounds and intact distal pulses.  Exam reveals no gallop and no friction rub.   No murmur heard. Pulmonary/Chest: Effort normal and breath sounds normal. No accessory muscle usage. Not tachypneic. No respiratory distress. She has no decreased breath sounds. She has no wheezes. She has no rhonchi. She has no rales. She exhibits no tenderness.  Musculoskeletal: Normal range of motion. She exhibits no edema and no tenderness.  Lymphadenopathy:    She has no cervical adenopathy.  Neurological: She is alert and oriented to person, place, and time. No cranial nerve deficit. She exhibits normal muscle tone. Coordination normal.  Skin: Skin is warm and dry. No rash noted. She is not diaphoretic. No erythema. No pallor.  Psychiatric: She has a normal mood and affect. Her behavior is normal. Judgment and thought content normal.          Assessment & Plan:  Over 31mn of which >50% spent in face-to-face contact with patient discussing plan of care

## 2013-01-16 NOTE — Assessment & Plan Note (Signed)
Recent episodes of night sweats, unclear etiology. Will check thyroid function with labs. CXR normal today.

## 2013-01-17 ENCOUNTER — Other Ambulatory Visit: Payer: Medicare Other

## 2013-01-17 LAB — CBC WITH DIFFERENTIAL/PLATELET
Basophils Absolute: 0 10*3/uL (ref 0.0–0.1)
Eosinophils Absolute: 0.2 10*3/uL (ref 0.0–0.7)
HCT: 43.9 % (ref 36.0–46.0)
Hemoglobin: 14.7 g/dL (ref 12.0–15.0)
Lymphocytes Relative: 40.9 % (ref 12.0–46.0)
MCHC: 33.5 g/dL (ref 30.0–36.0)
MCV: 93.5 fl (ref 78.0–100.0)
Monocytes Absolute: 0.3 10*3/uL (ref 0.1–1.0)
Monocytes Relative: 9.5 % (ref 3.0–12.0)
Neutro Abs: 1.2 10*3/uL — ABNORMAL LOW (ref 1.4–7.7)
Neutrophils Relative %: 43.4 % (ref 43.0–77.0)
Platelets: 179 10*3/uL (ref 150.0–400.0)
RDW: 13.3 % (ref 11.5–14.6)
WBC: 2.8 10*3/uL — ABNORMAL LOW (ref 4.5–10.5)

## 2013-01-17 LAB — COMPREHENSIVE METABOLIC PANEL
ALT: 26 U/L (ref 0–35)
Albumin: 4.1 g/dL (ref 3.5–5.2)
CO2: 26 mEq/L (ref 19–32)
Calcium: 9.5 mg/dL (ref 8.4–10.5)
Chloride: 107 mEq/L (ref 96–112)
GFR: 77.64 mL/min (ref >60.00)
Glucose, Bld: 97 mg/dL (ref 70–99)
Potassium: 4.8 mEq/L (ref 3.5–5.1)
Sodium: 140 mEq/L (ref 135–145)
Total Bilirubin: 0.3 mg/dL (ref 0.3–1.2)
Total Protein: 7.2 g/dL (ref 6.0–8.3)

## 2013-01-17 NOTE — Telephone Encounter (Signed)
OK. We will need to call her to bring her back in.

## 2013-01-17 NOTE — Telephone Encounter (Signed)
FYI to Dr. Gilford Rile

## 2013-01-23 NOTE — Telephone Encounter (Signed)
Patient did return for another draw and labs were resulted.

## 2013-01-24 ENCOUNTER — Encounter: Payer: Self-pay | Admitting: *Deleted

## 2013-01-28 ENCOUNTER — Encounter: Payer: Self-pay | Admitting: Internal Medicine

## 2013-01-28 DIAGNOSIS — D72819 Decreased white blood cell count, unspecified: Secondary | ICD-10-CM

## 2013-01-29 MED ORDER — ROPINIROLE HCL 2 MG PO TABS: 2.0000 mg | ORAL_TABLET | Freq: Two times a day (BID) | ORAL | Status: DC

## 2013-01-29 MED ORDER — LEVOTHYROXINE SODIUM 50 MCG PO TABS: 50.0000 ug | ORAL_TABLET | Freq: Every day | ORAL | Status: DC

## 2013-01-29 MED ORDER — ENALAPRIL MALEATE 10 MG PO TABS: 10.0000 mg | ORAL_TABLET | Freq: Every day | ORAL | Status: DC

## 2013-02-07 LAB — HM MAMMOGRAPHY

## 2013-02-11 ENCOUNTER — Ambulatory Visit: Payer: Self-pay | Admitting: Hematology and Oncology

## 2013-02-11 LAB — CBC CANCER CENTER
Basophil #: 0 x10 3/mm (ref 0.0–0.1)
Basophil %: 0.8 %
Lymphocyte %: 43.7 %
MCH: 31.3 pg (ref 26.0–34.0)
MCHC: 32.8 g/dL (ref 32.0–36.0)
MCV: 95 fL (ref 80–100)
Monocyte %: 7.2 %
Platelet: 181 x10 3/mm (ref 150–440)
RBC: 4.69 10*6/uL (ref 3.80–5.20)
RDW: 13 % (ref 11.5–14.5)

## 2013-02-15 ENCOUNTER — Ambulatory Visit: Payer: Self-pay | Admitting: Hematology and Oncology

## 2013-02-19 ENCOUNTER — Encounter: Payer: Self-pay | Admitting: Internal Medicine

## 2013-02-19 ENCOUNTER — Ambulatory Visit (INDEPENDENT_AMBULATORY_CARE_PROVIDER_SITE_OTHER): Payer: Medicare Other | Admitting: Internal Medicine

## 2013-02-19 VITALS — BP 120/80 | HR 78 | Temp 98.1°F | Wt 201.0 lb

## 2013-02-19 DIAGNOSIS — I10 Essential (primary) hypertension: Secondary | ICD-10-CM

## 2013-02-19 DIAGNOSIS — D72819 Decreased white blood cell count, unspecified: Secondary | ICD-10-CM

## 2013-02-19 DIAGNOSIS — M545 Low back pain, unspecified: Secondary | ICD-10-CM

## 2013-02-19 DIAGNOSIS — Z853 Personal history of malignant neoplasm of breast: Secondary | ICD-10-CM

## 2013-02-19 NOTE — Progress Notes (Signed)
Pre-visit discussion using our clinic review tool. No additional management support is needed unless otherwise documented below in the visit note.  

## 2013-02-19 NOTE — Assessment & Plan Note (Signed)
BP Readings from Last 3 Encounters:  02/19/13 120/80  01/16/13 120/84  11/12/12 110/72   BP well controlled on current medication. Will plan to recheck renal function at follow up in 3 months.

## 2013-02-19 NOTE — Progress Notes (Signed)
Subjective:    Patient ID: Gwendolyn [name], female    DOB: Oct 20, 1945, 67 y.o.   MRN: [id number]  HPI 67YO female with h/o breast cancer, hypertension, obesity, hypothyroidism, and chronic low back pain presents for follow up.  Leukopenia - Last visit made referral to hematology for review of blood film as WBC running near 2-3K. Pt reports review was normal. She was told leukopenia secondary to use of pantoprazole. No further follow up made  Chronic back pain - Scheduled for RFA later this year to help improve pain control. Continues to have aching pain in lower back. Improved with Butrans patch.  Breast cancer - Scheduled for left breast reconstruction with change of implant in 03/2013.  Depression - Symptoms improving. Has scheduled visits with family in [place] for Thanksgiving and [place] for Christmas.  Outpatient Encounter Prescriptions as of 02/19/2013  Medication Sig  . baclofen (LIORESAL) 10 MG tablet One in the morning and two at night  . buprenorphine (BUTRANS) 20 MCG/HR PTWK patch Place 20 mcg onto the skin once a week.  Marland Kitchen CALCIUM PO Take 1 tablet by mouth daily.  . DULoxetine (CYMBALTA) 60 MG capsule Take 1 capsule (60 mg total) by mouth daily.  . enalapril (VASOTEC) 10 MG tablet Take 1 tablet (10 mg total) by mouth daily.  Marland Kitchen FLUoxetine (PROZAC) 20 MG tablet Take 1 tablet (20 mg total) by mouth daily.  Marland Kitchen levothyroxine (SYNTHROID, LEVOTHROID) 50 MCG tablet Take 1 tablet (50 mcg total) by mouth daily.  . metoprolol succinate (TOPROL-XL) 25 MG 24 hr tablet Take 1 tablet (25 mg total) by mouth daily.  . Multiple Vitamins-Minerals (MULTIVITAMIN WITH MINERALS) tablet Take 1 tablet by mouth daily.  . pantoprazole (PROTONIX) 40 MG tablet Take 1 tablet (40 mg total) by mouth daily.  Marland Kitchen rOPINIRole (REQUIP) 2 MG tablet Take 1 tablet (2 mg total) by mouth 2 (two) times daily.  . vitamin E (VITAMIN E) 1000 UNIT capsule Take 1,000 Units by mouth daily.  . eszopiclone (LUNESTA) 2 MG TABS Take 1  tablet (2 mg total) by mouth at bedtime. Take immediately before bedtime  . pregabalin (LYRICA) 150 MG capsule Take 1 capsule (150 mg total) by mouth at bedtime.  . traMADol (ULTRAM) 50 MG tablet Take 1-2 tablets (50-100 mg total) by mouth every 6 (six) hours as needed.   BP 120/80  Pulse 78  Temp(Src) 98.1 F (36.7 C) (Oral)  Wt 201 lb (91.173 kg)  SpO2 95%  Review of Systems  Constitutional: Negative for fever, chills, appetite change, fatigue and unexpected weight change.  HENT: Negative for congestion, ear pain, sinus pressure, sore throat, trouble swallowing and voice change.   Eyes: Negative for visual disturbance.  Respiratory: Negative for cough, shortness of breath, wheezing and stridor.   Cardiovascular: Negative for chest pain, palpitations and leg swelling.  Gastrointestinal: Negative for nausea, vomiting, abdominal pain, diarrhea, constipation, blood in stool, abdominal distention and anal bleeding.  Genitourinary: Negative for dysuria and flank pain.  Musculoskeletal: Positive for back pain. Negative for arthralgias, gait problem, myalgias and neck pain.  Skin: Negative for color change and rash.  Neurological: Negative for dizziness and headaches.  Hematological: Negative for adenopathy. Does not bruise/bleed easily.  Psychiatric/Behavioral: Negative for suicidal ideas, sleep disturbance and dysphoric mood. The patient is not nervous/anxious.        Objective:   Physical Exam  Constitutional: She is oriented to person, place, and time. She appears well-developed and well-nourished. No distress.  HENT:  Head: Normocephalic and  atraumatic.  Right Ear: External ear normal.  Left Ear: External ear normal.  Nose: Nose normal.  Mouth/Throat: Oropharynx is clear and moist. No oropharyngeal exudate.  Eyes: Conjunctivae are normal. Pupils are equal, round, and reactive to light. Right eye exhibits no discharge. Left eye exhibits no discharge. No scleral icterus.  Neck:  Normal range of motion. Neck supple. No tracheal deviation present. No thyromegaly present.  Cardiovascular: Normal rate, regular rhythm, normal heart sounds and intact distal pulses.  Exam reveals no gallop and no friction rub.   No murmur heard. Pulmonary/Chest: Effort normal and breath sounds normal. No accessory muscle usage. Not tachypneic. No respiratory distress. She has no decreased breath sounds. She has no wheezes. She has no rhonchi. She has no rales. She exhibits no tenderness.  Musculoskeletal: Normal range of motion. She exhibits no edema and no tenderness.  Lymphadenopathy:    She has no cervical adenopathy.  Neurological: She is alert and oriented to person, place, and time. No cranial nerve deficit. She exhibits normal muscle tone. Coordination normal.  Skin: Skin is warm and dry. No rash noted. She is not diaphoretic. No erythema. No pallor.  Psychiatric: She has a normal mood and affect. Her behavior is normal. Judgment and thought content normal.          Assessment & Plan:

## 2013-02-19 NOTE — Assessment & Plan Note (Signed)
Planning for reconstructive surgery in 03/2013 with replacement of implant.

## 2013-02-19 NOTE — Assessment & Plan Note (Signed)
Pt recently evaluated by hematology for leukopenia. Per pt report, leukopenia was thought to be secondary to use of Protonix. Review of blood film was normal. She was instructed to continue with Protonix. Will plan to monitor CBC q6-12 months and prn.

## 2013-02-19 NOTE — Assessment & Plan Note (Signed)
Pain is persistent. Plan is for RFA later this year for better pain control. Will continue to follow.

## 2013-04-15 ENCOUNTER — Encounter: Payer: Self-pay | Admitting: Internal Medicine

## 2013-04-15 ENCOUNTER — Telehealth: Payer: Self-pay | Admitting: Internal Medicine

## 2013-04-15 DIAGNOSIS — F329 Major depressive disorder, single episode, unspecified: Secondary | ICD-10-CM

## 2013-04-15 DIAGNOSIS — F32A Depression, unspecified: Secondary | ICD-10-CM

## 2013-04-15 NOTE — Telephone Encounter (Signed)
Patient was sent a Mychart message

## 2013-04-15 NOTE — Telephone Encounter (Signed)
[facility] [address] She needs a 60 day supply of cymbalta 60 mg she just realized that she was about out of the medication and this is the pharmacy that she needs it sent to this time.

## 2013-04-23 MED ORDER — DULOXETINE HCL 60 MG PO CPEP: 60.0000 mg | ORAL_CAPSULE | Freq: Every day | ORAL | Status: DC

## 2013-04-23 NOTE — Telephone Encounter (Signed)
Prescription sent to pharmacy since patient did not respond to Mychart message

## 2013-05-06 ENCOUNTER — Ambulatory Visit (INDEPENDENT_AMBULATORY_CARE_PROVIDER_SITE_OTHER): Payer: Medicare Other | Admitting: Adult Health

## 2013-05-06 ENCOUNTER — Encounter: Payer: Self-pay | Admitting: Adult Health

## 2013-05-06 ENCOUNTER — Telehealth: Payer: Self-pay | Admitting: *Deleted

## 2013-05-06 VITALS — BP 122/76 | HR 70 | Temp 98.0°F | Resp 12 | Wt 194.0 lb

## 2013-05-06 DIAGNOSIS — F32A Depression, unspecified: Secondary | ICD-10-CM

## 2013-05-06 DIAGNOSIS — H8110 Benign paroxysmal vertigo, unspecified ear: Secondary | ICD-10-CM | POA: Insufficient documentation

## 2013-05-06 DIAGNOSIS — F329 Major depressive disorder, single episode, unspecified: Secondary | ICD-10-CM

## 2013-05-06 DIAGNOSIS — F3289 Other specified depressive episodes: Secondary | ICD-10-CM

## 2013-05-06 MED ORDER — MECLIZINE HCL 32 MG PO TABS: 32.0000 mg | ORAL_TABLET | Freq: Three times a day (TID) | ORAL | Status: DC | PRN

## 2013-05-06 MED ORDER — DULOXETINE HCL 60 MG PO CPEP: 60.0000 mg | ORAL_CAPSULE | Freq: Every day | ORAL | Status: DC

## 2013-05-06 NOTE — Patient Instructions (Signed)
  [facility] in [facility]   I also sent in prescription for meclizine. Use caution. This medication can make you sleepy.  Drink plenty of fluids.  Call with any concerns.

## 2013-05-06 NOTE — Progress Notes (Signed)
   Subjective:    Patient ID: Gwendolyn [name], female    DOB: Sep 14, 1945, 68 y.o.   MRN: [id number]  HPI  Presents with vertigo since Christmas. Intermittent. Nausea associated. Worse with movement of head side to side or up and down. Hx of vertigo and had w/u at [facility]. Takes dramamine and zofran which has worked. She has also taken Mucinex D.   Current Outpatient Prescriptions on File Prior to Visit  Medication Sig Dispense Refill  . baclofen (LIORESAL) 10 MG tablet One in the morning and two at night  270 each  3  . buprenorphine (BUTRANS) 20 MCG/HR PTWK patch Place 20 mcg onto the skin once a week.      Marland Kitchen CALCIUM PO Take 1 tablet by mouth daily.      . DULoxetine (CYMBALTA) 60 MG capsule Take 1 capsule (60 mg total) by mouth daily.  30 capsule  0  . enalapril (VASOTEC) 10 MG tablet Take 1 tablet (10 mg total) by mouth daily.  90 tablet  2  . FLUoxetine (PROZAC) 20 MG tablet Take 1 tablet (20 mg total) by mouth daily.  90 tablet  3  . levothyroxine (SYNTHROID, LEVOTHROID) 50 MCG tablet Take 1 tablet (50 mcg total) by mouth daily.  90 tablet  2  . metoprolol succinate (TOPROL-XL) 25 MG 24 hr tablet Take 1 tablet (25 mg total) by mouth daily.  90 tablet  1  . Multiple Vitamins-Minerals (MULTIVITAMIN WITH MINERALS) tablet Take 1 tablet by mouth daily.      . pantoprazole (PROTONIX) 40 MG tablet Take 1 tablet (40 mg total) by mouth daily.  90 tablet  3  . rOPINIRole (REQUIP) 2 MG tablet Take 1 tablet (2 mg total) by mouth 2 (two) times daily.  180 tablet  2  . vitamin E (VITAMIN E) 1000 UNIT capsule Take 1,000 Units by mouth daily.       No current facility-administered medications on file prior to visit.    Review of Systems  Gastrointestinal: Positive for nausea.       With rapid movements of head  Neurological: Positive for dizziness.       Hx of vertigo. Takes Mucinex and dramamine which helps  All other systems reviewed and are negative.       Objective:   Physical Exam    Constitutional: She is oriented to person, place, and time. No distress.  Cardiovascular: Normal rate and regular rhythm.   Pulmonary/Chest: Effort normal. No respiratory distress.  Musculoskeletal: Normal range of motion.  Neurological: She is alert and oriented to person, place, and time.  Skin: Skin is warm and dry.  Psychiatric: She has a normal mood and affect. Her behavior is normal. Judgment and thought content normal.          Assessment & Plan:

## 2013-05-06 NOTE — Assessment & Plan Note (Signed)
Meclizine and Zofran for nausea. Drink fluids. Change positions slowly. RTC is no improvement in symptoms within 1 week.

## 2013-05-06 NOTE — Telephone Encounter (Signed)
[facility], stating Meclizine comes 12.5 mg and 25 mg, Rx sent as 32 mg. Raquel notified, changed to 25 mg.

## 2013-05-06 NOTE — Progress Notes (Signed)
Pre visit review using our clinic review tool, if applicable. No additional management support is needed unless otherwise documented below in the visit note. 

## 2013-05-26 ENCOUNTER — Encounter: Payer: Self-pay | Admitting: *Deleted

## 2013-06-03 ENCOUNTER — Encounter: Payer: Medicare Other | Admitting: Internal Medicine

## 2013-06-10 ENCOUNTER — Telehealth: Payer: Self-pay | Admitting: Internal Medicine

## 2013-06-10 NOTE — Telephone Encounter (Signed)
Patient Information:  Caller Name: Katara  Phone: [phone]  Patient: Gwendolyn [name]  Gender: Female  DOB: 10/26/1945  Age: 68 Years  PCP: Ronette Deter (Adults only)  Office Follow Up:  Does the office need to follow up with this patient?: Yes  Instructions For The Office: Call patient and give further instructions.  RN Note:  Reports she feels like her medicine is not working as well as it was before. She has an appointment scheduled on 06/12/13 at 2:45pm. Reports she has a friend to call if she feels anxious that can help talk to her and calm her down if she has an anxiety attack. Please contact patient to give further instructions regarding her symptoms.  Symptoms  Reason For Call & Symptoms: Husband passed away approximately one year ago. Has been having issues with anxiety/panic recently. Reports depression symptoms. Does not want to do anything, wants to sleep more than normal. No appetite. Feels nausea when she does eat.  Reviewed Health History In EMR: Yes  Reviewed Medications In EMR: Yes  Reviewed Allergies In EMR: Yes  Reviewed Surgeries / Procedures: Yes  Date of Onset of Symptoms: 06/06/2013  Treatments Tried: Has been seeing psychologist as recommended by Dr. Gilford Rile. Cymbalta 1m and Prozac as prescribed.  Treatments Tried Worked: No  Guideline(s) Used:  No Protocol Available - Sick Adult  Disposition Per Guideline:   Discuss with PCP and Callback by Nurse within 1 Hour  Reason For Disposition Reached:   Nursing judgment  Advice Given:  N/A  Patient Will Follow Care Advice:  YES

## 2013-06-10 NOTE — Telephone Encounter (Signed)
Spoke with patient, she stated she thinks she will be fine until she see Dr.Walker on Thursday. Just wanted to make Dr. Gilford Rile aware of this prior to coming in for her appointment, did not need a return call.

## 2013-06-12 ENCOUNTER — Ambulatory Visit: Payer: Medicare Other | Admitting: Internal Medicine

## 2013-06-25 ENCOUNTER — Ambulatory Visit: Payer: Medicare Other | Admitting: Internal Medicine

## 2013-06-25 ENCOUNTER — Encounter: Payer: Self-pay | Admitting: *Deleted

## 2013-07-08 ENCOUNTER — Encounter: Payer: Self-pay | Admitting: Internal Medicine

## 2013-07-08 ENCOUNTER — Ambulatory Visit (INDEPENDENT_AMBULATORY_CARE_PROVIDER_SITE_OTHER): Payer: Medicare Other | Admitting: Internal Medicine

## 2013-07-08 VITALS — BP 130/94 | HR 87 | Temp 98.2°F | Resp 15 | Ht 61.0 in | Wt 188.0 lb

## 2013-07-08 DIAGNOSIS — G4700 Insomnia, unspecified: Secondary | ICD-10-CM

## 2013-07-08 DIAGNOSIS — R29898 Other symptoms and signs involving the musculoskeletal system: Secondary | ICD-10-CM

## 2013-07-08 DIAGNOSIS — F3289 Other specified depressive episodes: Secondary | ICD-10-CM

## 2013-07-08 DIAGNOSIS — Z0000 Encounter for general adult medical examination without abnormal findings: Secondary | ICD-10-CM

## 2013-07-08 DIAGNOSIS — I10 Essential (primary) hypertension: Secondary | ICD-10-CM

## 2013-07-08 DIAGNOSIS — F32A Depression, unspecified: Secondary | ICD-10-CM

## 2013-07-08 DIAGNOSIS — F329 Major depressive disorder, single episode, unspecified: Secondary | ICD-10-CM

## 2013-07-08 LAB — HM PAP SMEAR

## 2013-07-08 MED ORDER — LEVOTHYROXINE SODIUM 50 MCG PO TABS: 50.0000 ug | ORAL_TABLET | Freq: Every day | ORAL | Status: DC

## 2013-07-08 MED ORDER — METOPROLOL SUCCINATE ER 25 MG PO TB24: 25.0000 mg | ORAL_TABLET | Freq: Every day | ORAL | Status: DC

## 2013-07-08 MED ORDER — PANTOPRAZOLE SODIUM 40 MG PO TBEC: 40.0000 mg | DELAYED_RELEASE_TABLET | Freq: Every day | ORAL | Status: DC

## 2013-07-08 MED ORDER — ZALEPLON 5 MG PO CAPS: 5.0000 mg | ORAL_CAPSULE | Freq: Every evening | ORAL | Status: DC | PRN

## 2013-07-08 MED ORDER — ENALAPRIL MALEATE 10 MG PO TABS: 10.0000 mg | ORAL_TABLET | Freq: Every day | ORAL | Status: DC

## 2013-07-08 MED ORDER — FLUOXETINE HCL 20 MG PO TABS: 20.0000 mg | ORAL_TABLET | Freq: Every day | ORAL | Status: DC

## 2013-07-08 MED ORDER — DULOXETINE HCL 60 MG PO CPEP: 60.0000 mg | ORAL_CAPSULE | Freq: Every day | ORAL | Status: DC
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
## 2013-07-08 NOTE — Assessment & Plan Note (Signed)
BP Readings from Last 3 Encounters:  07/08/13 130/94  05/06/13 122/76  02/19/13 120/80   Blood pressure slightly elevated today. We'll continue current medications for now. Plan recheck in 4 weeks.

## 2013-07-08 NOTE — Assessment & Plan Note (Signed)
Chronic weakness bilateral thighs. Progressively worsening. Recommended PT. Pt declines for now. Will continue to follow up with pain management and see if any improvement after upcoming RFA.

## 2013-07-08 NOTE — Progress Notes (Signed)
Pre visit review using our clinic review tool, if applicable. No additional management support is needed unless otherwise documented below in the visit note. 

## 2013-07-08 NOTE — Assessment & Plan Note (Signed)
Recent worsening symptoms of insomnia. Will try adding Sonata. Followup in 4 weeks or sooner as needed.

## 2013-07-08 NOTE — Assessment & Plan Note (Signed)
Recent symptoms of depression. Complicated by bereavement after her husband's death. Offered support today. Discussed potential referral to another counselor. She would like to hold off for now. We'll continue fluoxetine. Followup in 4 weeks or sooner as needed.

## 2013-07-08 NOTE — Assessment & Plan Note (Signed)
General medical exam normal today. Breast exam deferred as patient has exam with her oncologist several times per year. Pap and pelvic deferred as she is status post complete hysterectomy. Mammogram is up-to-date. Colonoscopy up-to-date. Immunizations are up to date except for Prevnar which we discussed today. Reviewed recent lab work from [facility] including blood counts, kidney and liver function which were normal. We discussed referral for physical therapy given progressive weakness in her legs. She declines for now. She will continue to follow with pain management

## 2013-07-09 ENCOUNTER — Telehealth: Payer: Self-pay | Admitting: Internal Medicine

## 2013-07-09 NOTE — Telephone Encounter (Signed)
Relevant patient education assigned to patient using Emmi. ° °

## 2013-07-14 ENCOUNTER — Encounter: Payer: Self-pay | Admitting: Internal Medicine

## 2013-07-14 ENCOUNTER — Other Ambulatory Visit: Payer: Self-pay | Admitting: Internal Medicine

## 2013-07-14 MED ORDER — ALPRAZOLAM 0.25 MG PO TABS: 0.2500 mg | ORAL_TABLET | Freq: Every evening | ORAL | Status: DC | PRN

## 2013-07-21 DIAGNOSIS — M5412 Radiculopathy, cervical region: Secondary | ICD-10-CM | POA: Insufficient documentation

## 2013-08-01 ENCOUNTER — Ambulatory Visit: Payer: Self-pay | Admitting: Neurological Surgery

## 2013-08-07 ENCOUNTER — Ambulatory Visit (INDEPENDENT_AMBULATORY_CARE_PROVIDER_SITE_OTHER): Payer: Medicare Other | Admitting: Internal Medicine

## 2013-08-07 ENCOUNTER — Encounter: Payer: Self-pay | Admitting: Internal Medicine

## 2013-08-07 VITALS — BP 140/92 | HR 87 | Temp 98.3°F | Wt 190.0 lb

## 2013-08-07 DIAGNOSIS — F329 Major depressive disorder, single episode, unspecified: Secondary | ICD-10-CM

## 2013-08-07 DIAGNOSIS — M5412 Radiculopathy, cervical region: Secondary | ICD-10-CM

## 2013-08-07 DIAGNOSIS — G4700 Insomnia, unspecified: Secondary | ICD-10-CM

## 2013-08-07 DIAGNOSIS — F32A Depression, unspecified: Secondary | ICD-10-CM

## 2013-08-07 DIAGNOSIS — F3289 Other specified depressive episodes: Secondary | ICD-10-CM

## 2013-08-07 MED ORDER — TEMAZEPAM 15 MG PO CAPS: 15.0000 mg | ORAL_CAPSULE | Freq: Every evening | ORAL | Status: DC | PRN

## 2013-08-07 MED ORDER — ALPRAZOLAM 0.25 MG PO TABS: 0.2500 mg | ORAL_TABLET | Freq: Every evening | ORAL | Status: DC | PRN

## 2013-08-07 MED ORDER — ROPINIROLE HCL 2 MG PO TABS: 2.0000 mg | ORAL_TABLET | Freq: Two times a day (BID) | ORAL | Status: DC

## 2013-08-07 NOTE — Progress Notes (Signed)
Subjective:    Patient ID: Gwendolyn [name], female    DOB: [birth year], 68 y.o.   MRN: [id number]  HPI 68YO female presents for follow up.  Insomnia - Symptoms poorly controlled with Sonata. Some improvement with Xanax and Sonata. However, still wakes at 3am. No daytime somnolence with meds.  Depression - Continues to work with counselor which has been helpful. Occasional dysphoric mood. Feels that medications are helpful.  Recent evaluation at [facility] for dizziness including MRI brain and auditory canals was normal. Planning to try reduced dose of Baclofen to see if this is helpful. Also have evaluation at [facility] for numbness in right arm. MRI cervical spine pending.  Review of Systems  Constitutional: Negative for fever, chills, appetite change, fatigue and unexpected weight change.  HENT: Negative for congestion, ear pain, sinus pressure, sore throat, trouble swallowing and voice change.   Eyes: Negative for visual disturbance.  Respiratory: Negative for cough, shortness of breath, wheezing and stridor.   Cardiovascular: Negative for chest pain, palpitations and leg swelling.  Gastrointestinal: Negative for nausea, vomiting, abdominal pain, diarrhea, constipation, blood in stool, abdominal distention and anal bleeding.  Genitourinary: Negative for dysuria and flank pain.  Musculoskeletal: Negative for arthralgias, gait problem, myalgias and neck pain.  Skin: Negative for color change and rash.  Neurological: Positive for dizziness and numbness. Negative for headaches.  Hematological: Negative for adenopathy. Does not bruise/bleed easily.  Psychiatric/Behavioral: Positive for sleep disturbance and dysphoric mood. Negative for suicidal ideas. The patient is not nervous/anxious.        Objective:    BP 140/92  Pulse 87  Temp(Src) 98.3 F (36.8 C) (Oral)  Wt 190 lb (86.183 kg)  SpO2 96% Physical Exam  Constitutional: She is oriented to person, place, and time. She appears  well-developed and well-nourished. No distress.  HENT:  Head: Normocephalic and atraumatic.  Right Ear: External ear normal.  Left Ear: External ear normal.  Nose: Nose normal.  Mouth/Throat: Oropharynx is clear and moist. No oropharyngeal exudate.  Eyes: Conjunctivae are normal. Pupils are equal, round, and reactive to light. Right eye exhibits no discharge. Left eye exhibits no discharge. No scleral icterus.  Neck: Normal range of motion. Neck supple. No tracheal deviation present. No thyromegaly present.  Cardiovascular: Normal rate, regular rhythm, normal heart sounds and intact distal pulses.  Exam reveals no gallop and no friction rub.   No murmur heard. Pulmonary/Chest: Effort normal and breath sounds normal. No respiratory distress. She has no wheezes. She has no rales. She exhibits no tenderness.  Musculoskeletal: Normal range of motion. She exhibits no edema and no tenderness.  Lymphadenopathy:    She has no cervical adenopathy.  Neurological: She is alert and oriented to person, place, and time. No cranial nerve deficit. She exhibits normal muscle tone. Coordination normal.  Skin: Skin is warm and dry. No rash noted. She is not diaphoretic. No erythema. No pallor.  Psychiatric: She has a normal mood and affect. Her behavior is normal. Judgment and thought content normal.          Assessment & Plan:   Problem List Items Addressed This Visit   Cervical nerve root disorder     Reviewed recent notes from [facility] at [facility]. Continue to await MRI cervical spine results.    Relevant Medications      temazepam (RESTORIL) capsule      rOPINIRole (REQUIP) tablet      ALPRAZolam (XANAX) tablet   Depression     Symptoms generally well controlled  with Fluoxetine, Cymbalta and counseling. Will continue to follow.    Relevant Medications      ALPRAZolam (XANAX) tablet   Insomnia - Primary     No improvement in symptoms with Sonata. Will try changing to Temazepam. Continue prn  alprazolam. Follow up in 3 months and prn.    Relevant Medications      temazepam (RESTORIL) capsule       Return in about 3 months (around 11/06/2013) for Recheck.

## 2013-08-07 NOTE — Assessment & Plan Note (Signed)
No improvement in symptoms with Sonata. Will try changing to Temazepam. Continue prn alprazolam. Follow up in 3 months and prn.

## 2013-08-07 NOTE — Assessment & Plan Note (Signed)
Symptoms generally well controlled with Fluoxetine, Cymbalta and counseling. Will continue to follow.

## 2013-08-07 NOTE — Assessment & Plan Note (Signed)
Reviewed recent notes from NSU at [facility]. Continue to await MRI cervical spine results.

## 2013-09-18 ENCOUNTER — Encounter: Payer: Self-pay | Admitting: Internal Medicine

## 2013-09-18 DIAGNOSIS — F329 Major depressive disorder, single episode, unspecified: Secondary | ICD-10-CM

## 2013-09-18 DIAGNOSIS — F32A Depression, unspecified: Secondary | ICD-10-CM

## 2013-09-19 ENCOUNTER — Encounter: Payer: Self-pay | Admitting: Internal Medicine

## 2013-11-06 ENCOUNTER — Other Ambulatory Visit: Payer: Self-pay | Admitting: Internal Medicine

## 2013-11-06 ENCOUNTER — Ambulatory Visit: Payer: Medicare Other | Admitting: Internal Medicine

## 2013-11-06 ENCOUNTER — Other Ambulatory Visit: Payer: Self-pay | Admitting: Adult Health

## 2013-11-06 DIAGNOSIS — F329 Major depressive disorder, single episode, unspecified: Secondary | ICD-10-CM

## 2013-11-06 DIAGNOSIS — F32A Depression, unspecified: Secondary | ICD-10-CM

## 2013-11-06 MED ORDER — DULOXETINE HCL 60 MG PO CPEP: 60.0000 mg | ORAL_CAPSULE | Freq: Every day | ORAL | Status: DC

## 2013-11-26 ENCOUNTER — Ambulatory Visit (INDEPENDENT_AMBULATORY_CARE_PROVIDER_SITE_OTHER): Payer: Medicare Other | Admitting: Psychiatry

## 2013-11-26 VITALS — BP 118/72 | HR 94 | Ht 62.0 in | Wt 196.6 lb

## 2013-11-26 DIAGNOSIS — F4321 Adjustment disorder with depressed mood: Secondary | ICD-10-CM

## 2013-11-26 DIAGNOSIS — F4323 Adjustment disorder with mixed anxiety and depressed mood: Secondary | ICD-10-CM
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
## 2013-12-02 ENCOUNTER — Ambulatory Visit: Payer: Medicare Other | Admitting: Internal Medicine

## 2013-12-04 ENCOUNTER — Encounter: Payer: Self-pay | Admitting: Internal Medicine

## 2013-12-10 ENCOUNTER — Ambulatory Visit (INDEPENDENT_AMBULATORY_CARE_PROVIDER_SITE_OTHER): Payer: Medicare Other | Admitting: Internal Medicine

## 2013-12-10 ENCOUNTER — Encounter: Payer: Self-pay | Admitting: Internal Medicine

## 2013-12-10 VITALS — BP 118/72 | HR 78 | Ht 62.0 in | Wt 196.2 lb

## 2013-12-10 DIAGNOSIS — I10 Essential (primary) hypertension: Secondary | ICD-10-CM

## 2013-12-10 DIAGNOSIS — K7581 Nonalcoholic steatohepatitis (NASH): Secondary | ICD-10-CM

## 2013-12-10 DIAGNOSIS — F3289 Other specified depressive episodes: Secondary | ICD-10-CM

## 2013-12-10 DIAGNOSIS — F32A Depression, unspecified: Secondary | ICD-10-CM

## 2013-12-10 DIAGNOSIS — G4700 Insomnia, unspecified: Secondary | ICD-10-CM

## 2013-12-10 DIAGNOSIS — K7689 Other specified diseases of liver: Secondary | ICD-10-CM

## 2013-12-10 DIAGNOSIS — Z23 Encounter for immunization: Secondary | ICD-10-CM

## 2013-12-10 DIAGNOSIS — E039 Hypothyroidism, unspecified: Secondary | ICD-10-CM

## 2013-12-10 DIAGNOSIS — F329 Major depressive disorder, single episode, unspecified: Secondary | ICD-10-CM

## 2013-12-10 LAB — CBC WITH DIFFERENTIAL/PLATELET
Basophils Absolute: 0 10*3/uL (ref 0.0–0.1)
Basophils Relative: 0.6 % (ref 0.0–3.0)
Eosinophils Absolute: 0.1 10*3/uL (ref 0.0–0.7)
Eosinophils Relative: 4.7 % (ref 0.0–5.0)
HCT: 43 % (ref 36.0–46.0)
Hemoglobin: 14.4 g/dL (ref 12.0–15.0)
Lymphocytes Relative: 37.7 % (ref 12.0–46.0)
Lymphs Abs: 1.1 10*3/uL (ref 0.7–4.0)
MCHC: 33.6 g/dL (ref 30.0–36.0)
MCV: 94.1 fl (ref 78.0–100.0)
Monocytes Absolute: 0.3 10*3/uL (ref 0.1–1.0)
Monocytes Relative: 9.5 % (ref 3.0–12.0)
NEUTROS PCT: 47.5 % (ref 43.0–77.0)
Neutro Abs: 1.4 10*3/uL (ref 1.4–7.7)
PLATELETS: 185 10*3/uL (ref 150.0–400.0)
RBC: 4.57 Mil/uL (ref 3.87–5.11)
RDW: 13.4 % (ref 11.5–15.5)
WBC: 2.9 10*3/uL — AB (ref 4.0–10.5)

## 2013-12-10 LAB — COMPREHENSIVE METABOLIC PANEL
ALK PHOS: 58 U/L (ref 39–117)
ALT: 24 U/L (ref 0–35)
AST: 48 U/L — AB (ref 0–37)
Albumin: 3.8 g/dL (ref 3.5–5.2)
BUN: 11 mg/dL (ref 6–23)
CALCIUM: 9.6 mg/dL (ref 8.4–10.5)
CHLORIDE: 110 meq/L (ref 96–112)
CO2: 25 mEq/L (ref 19–32)
Creatinine, Ser: 0.7 mg/dL (ref 0.4–1.2)
GFR: 87.57 mL/min (ref >60.00)
GLUCOSE: 84 mg/dL (ref 70–99)
POTASSIUM: 5 meq/L (ref 3.5–5.1)
SODIUM: 146 meq/L — AB (ref 135–145)
TOTAL PROTEIN: 6.9 g/dL (ref 6.0–8.3)
Total Bilirubin: 0.6 mg/dL (ref 0.2–1.2)

## 2013-12-10 LAB — TSH: TSH: 1.71 u[IU]/mL (ref 0.35–4.50)

## 2013-12-10 MED ORDER — ALPRAZOLAM 0.25 MG PO TABS: 0.2500 mg | ORAL_TABLET | Freq: Every evening | ORAL | Status: DC | PRN

## 2013-12-10 MED ORDER — TEMAZEPAM 15 MG PO CAPS: 15.0000 mg | ORAL_CAPSULE | Freq: Every evening | ORAL | Status: DC | PRN

## 2013-12-10 NOTE — Assessment & Plan Note (Signed)
Symptoms well controlled with Temazepam. Will continue.

## 2013-12-10 NOTE — Assessment & Plan Note (Signed)
Symptoms much improved. On Cymbalta alone. Will continue.

## 2013-12-10 NOTE — Patient Instructions (Signed)
Try stopping Baclofen.  Call if any problems with this.  Follow up in 6 months and prn.

## 2013-12-10 NOTE — Assessment & Plan Note (Signed)
Will check TSH with labs today.

## 2013-12-10 NOTE — Assessment & Plan Note (Signed)
  BP Readings from Last 3 Encounters:  12/10/13 118/72  11/26/13 118/72  08/07/13 140/92   BP well controlled on current medications. Will check renal function with labs today.

## 2013-12-10 NOTE — Progress Notes (Signed)
Pre visit review using our clinic review tool, if applicable. No additional management support is needed unless otherwise documented below in the visit note. 

## 2013-12-10 NOTE — Assessment & Plan Note (Signed)
Will check liver function with labs today. Continue to follow with Dr. Gerald Dexter.

## 2013-12-10 NOTE — Progress Notes (Signed)
Subjective:    Patient ID: Gwendolyn [name], female    DOB: 01/24/1946, 68 y.o.   MRN: [id number]  HPI 68YO female presents for follow up.  Has fallen twice resulting in injuries to lower legs. Dose on Butrans patch was decreased. Would like to stop Baclofen.  Seen by psychiatry. Stopped Fluoxetine. No side effects noted with this. Feels that depression is 80% improved compared to previous.  Restless legs - Requip makes a huge difference. Otherwise, unable to sleep.  Planning to move to [place], [place] this fall.  Review of Systems  Constitutional: Negative for fever, chills, appetite change, fatigue and unexpected weight change.  Eyes: Negative for visual disturbance.  Respiratory: Negative for shortness of breath.   Cardiovascular: Negative for chest pain and leg swelling.  Gastrointestinal: Negative for abdominal pain.  Musculoskeletal: Positive for arthralgias, back pain and myalgias.  Skin: Negative for color change and rash.  Hematological: Negative for adenopathy. Does not bruise/bleed easily.  Psychiatric/Behavioral: Negative for suicidal ideas, sleep disturbance and dysphoric mood. The patient is not nervous/anxious.        Objective:    BP 118/72  Pulse 78  Ht 5' 2"  (1.575 m)  Wt 196 lb 4 oz (89.018 kg)  BMI 35.89 kg/m2  SpO2 97% Physical Exam  Constitutional: She is oriented to person, place, and time. She appears well-developed and well-nourished. No distress.  HENT:  Head: Normocephalic and atraumatic.  Right Ear: External ear normal.  Left Ear: External ear normal.  Nose: Nose normal.  Mouth/Throat: Oropharynx is clear and moist. No oropharyngeal exudate.  Eyes: Conjunctivae are normal. Pupils are equal, round, and reactive to light. Right eye exhibits no discharge. Left eye exhibits no discharge. No scleral icterus.  Neck: Normal range of motion. Neck supple. No tracheal deviation present. No thyromegaly present.  Cardiovascular: Normal rate, regular  rhythm, normal heart sounds and intact distal pulses.  Exam reveals no gallop and no friction rub.   No murmur heard. Pulmonary/Chest: Effort normal and breath sounds normal. No accessory muscle usage. Not tachypneic. No respiratory distress. She has no decreased breath sounds. She has no wheezes. She has no rhonchi. She has no rales. She exhibits no tenderness.  Musculoskeletal: Normal range of motion. She exhibits no edema and no tenderness.  Lymphadenopathy:    She has no cervical adenopathy.  Neurological: She is alert and oriented to person, place, and time. No cranial nerve deficit. She exhibits normal muscle tone. Coordination normal.  Skin: Skin is warm and dry. No rash noted. She is not diaphoretic. No erythema. No pallor.  Psychiatric: She has a normal mood and affect. Her behavior is normal. Judgment and thought content normal.          Assessment & Plan:   Problem List Items Addressed This Visit     Unprioritized   Depression     Symptoms much improved. On Cymbalta alone. Will continue.    Relevant Medications      ALPRAZolam  (XANAX) tablet   Essential hypertension, benign       BP Readings from Last 3 Encounters:  12/10/13 118/72  11/26/13 118/72  08/07/13 140/92   BP well controlled on current medications. Will check renal function with labs today.    Insomnia - Primary     Symptoms well controlled with Temazepam. Will continue.    Relevant Medications      temazepam (RESTORIL) capsule   NASH (nonalcoholic steatohepatitis)     Will check liver function with labs  today. Continue to follow with Dr. Gerald Dexter.    Relevant Orders      CBC with Differential      Comprehensive metabolic panel   Unspecified hypothyroidism     Will check TSH with labs today.    Relevant Orders      TSH    Other Visit Diagnoses   Need for prophylactic vaccination and inoculation against influenza        Relevant Orders       Flu Vaccine QUAD 36+ mos PF IM (Fluarix Quad PF)  (Completed)    Need for vaccination with 13-polyvalent pneumococcal conjugate vaccine        Relevant Orders       Pneumococcal conjugate vaccine 13-valent (Completed)        Return in about 6 months (around 06/12/2014) for Wellness Visit.

## 2014-03-04 ENCOUNTER — Ambulatory Visit (HOSPITAL_COMMUNITY): Payer: Self-pay | Admitting: Psychiatry

## 2017-11-26 ENCOUNTER — Encounter (INDEPENDENT_AMBULATORY_CARE_PROVIDER_SITE_OTHER): Payer: Self-pay | Admitting: Vascular Surgery

## 2017-11-26 ENCOUNTER — Ambulatory Visit (INDEPENDENT_AMBULATORY_CARE_PROVIDER_SITE_OTHER): Payer: Medicare Other | Admitting: Vascular Surgery

## 2017-11-26 VITALS — BP 160/98 | HR 76 | Resp 16 | Ht 61.0 in | Wt 235.4 lb

## 2017-11-26 DIAGNOSIS — M79604 Pain in right leg: Secondary | ICD-10-CM

## 2017-11-26 DIAGNOSIS — I872 Venous insufficiency (chronic) (peripheral): Secondary | ICD-10-CM | POA: Diagnosis not present

## 2017-11-26 DIAGNOSIS — I890 Lymphedema, not elsewhere classified: Secondary | ICD-10-CM

## 2017-11-26 DIAGNOSIS — K219 Gastro-esophageal reflux disease without esophagitis: Secondary | ICD-10-CM

## 2017-11-26 DIAGNOSIS — I10 Essential (primary) hypertension: Secondary | ICD-10-CM | POA: Diagnosis not present

## 2017-11-26 DIAGNOSIS — M79605 Pain in left leg: Secondary | ICD-10-CM

## 2017-11-28 ENCOUNTER — Encounter (INDEPENDENT_AMBULATORY_CARE_PROVIDER_SITE_OTHER): Payer: Self-pay | Admitting: Vascular Surgery

## 2017-11-28 DIAGNOSIS — M79606 Pain in leg, unspecified: Secondary | ICD-10-CM | POA: Insufficient documentation

## 2017-11-28 DIAGNOSIS — I872 Venous insufficiency (chronic) (peripheral): Secondary | ICD-10-CM | POA: Insufficient documentation

## 2017-11-28 DIAGNOSIS — K219 Gastro-esophageal reflux disease without esophagitis: Secondary | ICD-10-CM | POA: Insufficient documentation

## 2017-11-28 DIAGNOSIS — I890 Lymphedema, not elsewhere classified: Secondary | ICD-10-CM | POA: Insufficient documentation

## 2017-11-28 NOTE — Progress Notes (Signed)
MRN : [id number]  Gwendolyn [name] is a 72 y.o. ([birth year]) female who presents with chief complaint of  Chief Complaint  Patient presents with  . Leg Swelling  .  History of Present Illness:   Patient is seen for evaluation of leg pain and leg swelling. The patient first noticed the swelling remotely. The swelling is associated with pain and discoloration. The pain and swelling worsens with prolonged dependency and improves with elevation. The pain is unrelated to activity.  The patient notes that in the morning the legs are significantly improved but they steadily worsened throughout the course of the day. The patient also notes a steady worsening of the discoloration in the ankle and shin area.   The patient denies claudication symptoms.  The patient denies symptoms consistent with rest pain.  The patient denies and extensive history of DJD and LS spine disease.  The patient has no had any past angiography, interventions or vascular surgery.  Elevation makes the leg symptoms better, dependency makes them much worse. There is no history of ulcerations. The patient denies any recent changes in medications.  The patient has not been wearing graduated compression.  The patient denies a history of DVT or PE. There is no prior history of phlebitis. There is no history of primary lymphedema.  No history of malignancies. No history of trauma or groin or pelvic surgery. There is no history of radiation treatment to the groin or pelvis  The patient denies amaurosis fugax or recent TIA symptoms. There are no recent neurological changes noted. The patient denies recent episodes of angina or shortness of breath  Current Meds  Medication Sig  . albuterol (PROVENTIL HFA;VENTOLIN HFA) 108 (90 Base) MCG/ACT inhaler Inhale 2 puffs into the lungs every 6 (six) hours as needed for wheezing or shortness of breath.  Marland Kitchen atorvastatin (LIPITOR) 40 MG tablet Take 40 mg by mouth daily.  . Azelastine HCl  0.15 % SOLN USE 1-2 SPRAYS EACH NOSTRIL TWICE DAILY  . baclofen (LIORESAL) 10 MG tablet Take 10 mg by mouth at bedtime. One in the morning and two at night  . buprenorphine (BUTRANS) 10 MCG/HR PTWK patch Place 10 mcg onto the skin once a week.  Marland Kitchen CALCIUM PO Take 1 tablet by mouth daily.  . cholecalciferol (VITAMIN D) 1000 UNITS tablet Take 1,000 Units by mouth daily.  . enalapril (VASOTEC) 10 MG tablet Take 1 tablet (10 mg total) by mouth daily.  Marland Kitchen escitalopram (LEXAPRO) 20 MG tablet Take by mouth.  . fluticasone (FLONASE) 50 MCG/ACT nasal spray fluticasone propionate 50 mcg/actuation nasal spray,suspension  USE 2 SPRAYS IN EACH NOSTRIL ONCE A DAY  . furosemide (LASIX) 20 MG tablet Take by mouth.  . gabapentin (NEURONTIN) 300 MG capsule Take 2 capsules (662m) in the morning and afternoon and take 3 capsules (9069m in evening.  . ibandronate (BONIVA) 150 MG tablet Take by mouth.  . lamoTRIgine (LAMICTAL) 25 MG tablet lamotrigine 25 mg tablet  TAKE 1 TABLET TWICE A DAY  . levothyroxine (SYNTHROID, LEVOTHROID) 50 MCG tablet Take 1 tablet (50 mcg total) by mouth daily.  . metoprolol succinate (TOPROL-XL) 25 MG 24 hr tablet Take 1 tablet (25 mg total) by mouth daily.  . Multiple Vitamins-Minerals (MULTIVITAMIN WITH MINERALS) tablet Take 1 tablet by mouth daily.  . pantoprazole (PROTONIX) 40 MG tablet Take 1 tablet (40 mg total) by mouth daily.  . Marland KitchenOPINIRole (REQUIP) 2 MG tablet Take 1 tablet (2 mg total) by mouth 2 (two) times  daily.  . vitamin E (VITAMIN E) 1000 UNIT capsule Take 1,000 Units by mouth daily.  . vitamin E 1000 UNIT capsule vitamin E (dl, acetate) 1,000 unit capsule  Take by oral route.    Past Medical History:  Diagnosis Date  . Anxiety   . Arthritis   . Breast cancer ([place]) 01/2010   left mastectomy, LN neg, on femara  . Chronic low back pain   . Depression   . Fibromyalgia   . GERD (gastroesophageal reflux disease)   . History of chicken pox   . HTN (hypertension)     . Hyperlipidemia   . Hypothyroidism   . IBS (irritable bowel syndrome)   . Migraines   . NASH (nonalcoholic steatohepatitis)    Follow[facility] by Dr. Gerald Dexter at [facility]  . RLS (restless legs syndrome)   . Trigeminal neuralgia       Social History Social History   Tobacco Use  . Smoking status: Never Smoker  . Smokeless tobacco: Never Used  Substance Use Topics  . Alcohol use: Yes    Comment: Occasional  . Drug use: No    Family History Family History  Problem Relation Age of Onset  . COPD Mother   . Alzheimer's disease Father   . Cancer Sister 55       Breast  . Arthritis Maternal Grandmother   . Arthritis Maternal Grandfather   No family history of bleeding/clotting disorders, porphyria or autoimmune disease   Allergies  Allergen Reactions  . Codeine Nausea And Vomiting  . Silver Other (See Comments)    Blisters.  Okay on hands.     REVIEW OF SYSTEMS (Negative unless checked)  Constitutional: [] Weight loss  [] Fever  [] Chills Cardiac: [] Chest pain   [] Chest pressure   [] Palpitations   [] Shortness of breath when laying flat   [] Shortness of breath with exertion. Vascular:  [] Pain in legs with walking   [x] Pain in legs with standing  [] History of DVT   [] Phlebitis   [x] Swelling in legs   [x] Varicose veins   [] Non-healing ulcers Pulmonary:   [] Uses home oxygen   [] Productive cough   [] Hemoptysis   [] Wheeze  [] COPD   [] Asthma Neurologic:  [] Dizziness   [] Seizures   [] History of stroke   [] History of TIA  [] Aphasia   [] Vissual changes   [] Weakness or numbness in arm   [] Weakness or numbness in leg Musculoskeletal:   [x] Joint swelling   [x] Joint pain   [x] Low back pain Hematologic:  [] Easy bruising  [] Easy bleeding   [] Hypercoagulable state   [] Anemic Gastrointestinal:  [] Diarrhea   [] Vomiting  [x] Gastroesophageal reflux/heartburn   [] Difficulty swallowing. Genitourinary:  [] Chronic kidney disease   [] Difficult urination  [] Frequent urination   [] Blood in urine Skin:  [] Rashes    [] Ulcers  Psychological:  [] History of anxiety   []  History of major depression.  Physical Examination  Vitals:   11/26/17 1536  BP: (!) 160/98  Pulse: 76  Resp: 16  Weight: 235 lb 6.4 oz (106.8 kg)  Height: 5' 1"  (1.549 m)   Body mass index is 44.48 kg/m. Gen: WD/WN, NAD Head: [place]/AT, No temporalis wasting.  Ear/Nose/Throat: Hearing grossly intact, nares w/o erythema or drainage, poor dentition Eyes: PER, EOMI, sclera nonicteric.  Neck: Supple, no masses.  No bruit or JVD.  Pulmonary:  Good air movement, clear to auscultation bilaterally, no use of accessory muscles.  Cardiac: RRR, normal S1, S2, no Murmurs. Vascular: scattered varicosities present bilaterally.  Mild venous stasis changes to the legs  bilaterally.  2-3+ soft pitting edema Vessel Right Left  Radial Palpable Palpable  PT Palpable Palpable  DP Palpable Palpable  Gastrointestinal: soft, non-distended. No guarding/no peritoneal signs.  Musculoskeletal: M/S 5/5 throughout.  No deformity or atrophy.  Neurologic: CN 2-12 intact. Pain and light touch intact in extremities.  Symmetrical.  Speech is fluent. Motor exam as listed above. Psychiatric: Judgment intact, Mood & affect appropriate for pt's clinical situation. Dermatologic: venous rashes no ulcers noted.  No changes consistent with cellulitis. Lymph : No Cervical lymphadenopathy, no lichenification or skin changes of chronic lymphedema.  CBC Lab Results  Component Value Date   WBC 2.9 (L) 12/10/2013   HGB 14.4 12/10/2013   HCT 43.0 12/10/2013   MCV 94.1 12/10/2013   PLT 185.0 12/10/2013    BMET    Component Value Date/Time   NA 146 (H) 12/10/2013 0927   K 5.0 12/10/2013 0927   CL 110 12/10/2013 0927   CO2 25 12/10/2013 0927   GLUCOSE 84 12/10/2013 0927   BUN 11 12/10/2013 0927   CREATININE 0.7 12/10/2013 0927   CALCIUM 9.6 12/10/2013 0927   CrCl cannot be calculated (Patient's most recent lab result is older than the maximum 21 days  allowed.).  COAG No results found for: INR, PROTIME  Radiology No results found.   Assessment/Plan 1. Chronic venous insufficiency No surgery or intervention at this point in time.    I have had a long discussion with the patient regarding venous insufficiency and why it  causes symptoms. I have discussed with the patient the chronic skin changes that accompany venous insufficiency and the long term sequela such as infection and ulceration.  Patient will begin wearing graduated compression stockings class 1 (20-30 mmHg) or compression wraps on a daily basis a prescription was given. The patient will put the stockings on first thing in the morning and removing them in the evening. The patient is instructed specifically not to sleep in the stockings.    In addition, behavioral modification including several periods of elevation of the lower extremities during the day will be continued. I have demonstrated that proper elevation is a position with the ankles at heart level.  The patient is instructed to begin routine exercise, especially walking on a daily basis  Patient should undergo duplex ultrasound of the venous system to ensure that DVT or reflux is not present.  Following the review of the ultrasound the patient will follow up in 2-3 months to reassess the degree of swelling and the control that graduated compression stockings or compression wraps  is offering.   The patient can be assessed for a Lymph Pump at that time  - VAS [place] LOWER EXTREMITY VENOUS REFLUX; Future  2. Lymphedema I have had a long discussion with the patient regarding swelling and why it  causes symptoms.  Patient will begin wearing graduated compression stockings class 1 (20-30 mmHg) on a daily basis a prescription was given. The patient will  beginning wearing the stockings first thing in the morning and removing them in the evening. The patient is instructed specifically not to sleep in the stockings.   In  addition, behavioral modification will be initiated.  This will include frequent elevation, use of over the counter pain medications and exercise such as walking.  I have reviewed systemic causes for chronic edema such as liver, kidney and cardiac etiologies.  The patient denies problems with these organ systems.    Consideration for a lymph pump will also be made  based upon the effectiveness of conservative therapy.  This would help to improve the edema control and prevent sequela such as ulcers and infections   Patient should undergo duplex ultrasound of the venous system to ensure that DVT or reflux is not present.  The patient will follow-up with me after the ultrasound.   3. Pain in both lower extremities See 1&2  - VAS [place] LOWER EXTREMITY VENOUS REFLUX; Future  4. Essential hypertension, benign Continue antihypertensive medications as already ordered, these medications have been reviewed and there are no changes at this time.   5. Gastroesophageal reflux disease, esophagitis presence not specified Continue PPI as already ordered, this medication has been reviewed and there are no changes at this time.  Avoidence of caffeine and alcohol  Moderate elevation of the head of the bed     Hortencia Pilar, MD  11/28/2017 1:00 PM

## 2018-01-09 ENCOUNTER — Emergency Department: Payer: Medicare Other

## 2018-01-09 ENCOUNTER — Encounter: Payer: Self-pay | Admitting: *Deleted

## 2018-01-09 ENCOUNTER — Other Ambulatory Visit: Payer: Self-pay

## 2018-01-09 ENCOUNTER — Inpatient Hospital Stay
Admission: EM | Admit: 2018-01-09 | Discharge: 2018-01-11 | DRG: 641 | Disposition: A | Discharge status: Home / Self Care | Payer: Medicare Other | Attending: Internal Medicine | Admitting: Internal Medicine

## 2018-01-09 DIAGNOSIS — Z91048 Other nonmedicinal substance allergy status: Secondary | ICD-10-CM

## 2018-01-09 DIAGNOSIS — G500 Trigeminal neuralgia: Secondary | ICD-10-CM | POA: Diagnosis present

## 2018-01-09 DIAGNOSIS — N179 Acute kidney failure, unspecified: Secondary | ICD-10-CM | POA: Diagnosis present

## 2018-01-09 DIAGNOSIS — Z9181 History of falling: Secondary | ICD-10-CM

## 2018-01-09 DIAGNOSIS — N189 Chronic kidney disease, unspecified: Secondary | ICD-10-CM | POA: Diagnosis present

## 2018-01-09 DIAGNOSIS — K7581 Nonalcoholic steatohepatitis (NASH): Secondary | ICD-10-CM | POA: Diagnosis present

## 2018-01-09 DIAGNOSIS — Z853 Personal history of malignant neoplasm of breast: Secondary | ICD-10-CM | POA: Diagnosis not present

## 2018-01-09 DIAGNOSIS — E785 Hyperlipidemia, unspecified: Secondary | ICD-10-CM | POA: Diagnosis present

## 2018-01-09 DIAGNOSIS — W19XXXA Unspecified fall, initial encounter: Secondary | ICD-10-CM

## 2018-01-09 DIAGNOSIS — R402142 Coma scale, eyes open, spontaneous, at arrival to emergency department: Secondary | ICD-10-CM | POA: Diagnosis present

## 2018-01-09 DIAGNOSIS — Z820 Family history of epilepsy and other diseases of the nervous system: Secondary | ICD-10-CM

## 2018-01-09 DIAGNOSIS — G8929 Other chronic pain: Secondary | ICD-10-CM | POA: Diagnosis present

## 2018-01-09 DIAGNOSIS — K589 Irritable bowel syndrome without diarrhea: Secondary | ICD-10-CM | POA: Diagnosis present

## 2018-01-09 DIAGNOSIS — F418 Other specified anxiety disorders: Secondary | ICD-10-CM | POA: Diagnosis present

## 2018-01-09 DIAGNOSIS — E039 Hypothyroidism, unspecified: Secondary | ICD-10-CM | POA: Diagnosis present

## 2018-01-09 DIAGNOSIS — Z9089 Acquired absence of other organs: Secondary | ICD-10-CM

## 2018-01-09 DIAGNOSIS — R296 Repeated falls: Secondary | ICD-10-CM | POA: Diagnosis present

## 2018-01-09 DIAGNOSIS — Z8261 Family history of arthritis: Secondary | ICD-10-CM

## 2018-01-09 DIAGNOSIS — K7460 Unspecified cirrhosis of liver: Secondary | ICD-10-CM | POA: Diagnosis present

## 2018-01-09 DIAGNOSIS — Z79890 Hormone replacement therapy (postmenopausal): Secondary | ICD-10-CM

## 2018-01-09 DIAGNOSIS — Z9012 Acquired absence of left breast and nipple: Secondary | ICD-10-CM | POA: Diagnosis not present

## 2018-01-09 DIAGNOSIS — Z825 Family history of asthma and other chronic lower respiratory diseases: Secondary | ICD-10-CM

## 2018-01-09 DIAGNOSIS — R402252 Coma scale, best verbal response, oriented, at arrival to emergency department: Secondary | ICD-10-CM | POA: Diagnosis present

## 2018-01-09 DIAGNOSIS — I129 Hypertensive chronic kidney disease with stage 1 through stage 4 chronic kidney disease, or unspecified chronic kidney disease: Secondary | ICD-10-CM | POA: Diagnosis present

## 2018-01-09 DIAGNOSIS — Z90722 Acquired absence of ovaries, bilateral: Secondary | ICD-10-CM

## 2018-01-09 DIAGNOSIS — M797 Fibromyalgia: Secondary | ICD-10-CM | POA: Diagnosis present

## 2018-01-09 DIAGNOSIS — E860 Dehydration: Principal | ICD-10-CM | POA: Diagnosis present

## 2018-01-09 DIAGNOSIS — Z9049 Acquired absence of other specified parts of digestive tract: Secondary | ICD-10-CM | POA: Diagnosis not present

## 2018-01-09 DIAGNOSIS — R402362 Coma scale, best motor response, obeys commands, at arrival to emergency department: Secondary | ICD-10-CM | POA: Diagnosis present

## 2018-01-09 DIAGNOSIS — M545 Low back pain: Secondary | ICD-10-CM | POA: Diagnosis present

## 2018-01-09 DIAGNOSIS — Z803 Family history of malignant neoplasm of breast: Secondary | ICD-10-CM

## 2018-01-09 DIAGNOSIS — R531 Weakness: Secondary | ICD-10-CM

## 2018-01-09 DIAGNOSIS — Z791 Long term (current) use of non-steroidal anti-inflammatories (NSAID): Secondary | ICD-10-CM

## 2018-01-09 DIAGNOSIS — Z79899 Other long term (current) drug therapy: Secondary | ICD-10-CM

## 2018-01-09 DIAGNOSIS — Z90710 Acquired absence of both cervix and uterus: Secondary | ICD-10-CM

## 2018-01-09 DIAGNOSIS — Z885 Allergy status to narcotic agent status: Secondary | ICD-10-CM

## 2018-01-09 DIAGNOSIS — G2581 Restless legs syndrome: Secondary | ICD-10-CM | POA: Diagnosis present

## 2018-01-09 DIAGNOSIS — K219 Gastro-esophageal reflux disease without esophagitis: Secondary | ICD-10-CM | POA: Diagnosis present

## 2018-01-09 DIAGNOSIS — Z9884 Bariatric surgery status: Secondary | ICD-10-CM

## 2018-01-09 DIAGNOSIS — Z7983 Long term (current) use of bisphosphonates: Secondary | ICD-10-CM

## 2018-01-09 LAB — CBC
HCT: 43 % (ref 35.0–47.0)
Hemoglobin: 14.5 g/dL (ref 12.0–16.0)
MCH: 32.9 pg (ref 26.0–34.0)
MCHC: 33.7 g/dL (ref 32.0–36.0)
MCV: 97.4 fL (ref 80.0–100.0)
Platelets: 164 10*3/uL (ref 150–440)
RBC: 4.41 MIL/uL (ref 3.80–5.20)
RDW: 13.4 % (ref 11.5–14.5)
WBC: 4.1 10*3/uL (ref 3.6–11.0)

## 2018-01-09 LAB — TROPONIN I

## 2018-01-09 LAB — BASIC METABOLIC PANEL
Anion gap: 12 (ref 5–15)
BUN: 31 mg/dL — AB (ref 8–23)
CALCIUM: 9.3 mg/dL (ref 8.9–10.3)
CHLORIDE: 107 mmol/L (ref 98–111)
CO2: 22 mmol/L (ref 22–32)
CREATININE: 2.13 mg/dL — AB (ref 0.44–1.00)
GFR calc non Af Amer: 23 mL/min — ABNORMAL LOW (ref >60)
GFR, EST AFRICAN AMERICAN: 26 mL/min — AB (ref >60)
Glucose, Bld: 90 mg/dL (ref 70–99)
Potassium: 4.6 mmol/L (ref 3.5–5.1)
SODIUM: 141 mmol/L (ref 135–145)

## 2018-01-09 MED ORDER — SODIUM CHLORIDE 0.9 % IV BOLUS
1000.0000 mL | Freq: Once | INTRAVENOUS | Status: AC
  Administered 2018-01-09: 1000 mL via INTRAVENOUS

## 2018-01-09 NOTE — H&P (Addendum)
[facility] [place] NAME: Gwendolyn [name]    MR#:  [id number]  DATE OF BIRTH:  1945/10/11  DATE OF ADMISSION:  01/09/2018  PRIMARY CARE PHYSICIAN: Barbaraann Boys, MD   REQUESTING/REFERRING PHYSICIAN:   CHIEF COMPLAINT:   Chief Complaint  Patient presents with  . Fall    HISTORY OF PRESENT ILLNESS: Gwendolyn [name]  is a 72 y.o. female with a known history of nonalcoholic steatohepatitis, hyper tension, fibromyalgia, anxiety and depression disorder, irritable bowel syndrome, remote breast cancer status post left mastectomy and other comorbidities. Patient presented to emergency room for generalized weakness, drowsiness and dizziness going on for the past 24 hours, gradually getting worse.  Patient reports she feels very thirsty.  She denies any chest pain, shortness of breath, palpitations, abdominal pain, vomiting or diarrhea, no bleeding.  She did fall twice on her bottom, earlier today, at home, due to generalized weakness; there was no head trauma or LOC.  Patient denies having similar episodes in the past. She denies any new medications, but she has been taking Lasix as needed for lower extremities swelling.  She currently does not have any lower extremities swelling. Blood test done emergency room are remarkable for elevated creatinine level at 2.13 and elevated BUN level at 31.  The reminder of CBC and BMP are unremarkable. Chest x-ray is negative for any acute cardiopulmonary disease. Patient is admitted for further evaluation and treatment.  PAST MEDICAL HISTORY:   Past Medical History:  Diagnosis Date  . Anxiety   . Arthritis   . Breast cancer ([place]) 01/2010   left mastectomy, LN neg, on femara  . Chronic low back pain   . Depression   . Fibromyalgia   . GERD (gastroesophageal reflux disease)   . History of chicken pox   . HTN (hypertension)   . Hyperlipidemia   . Hypothyroidism   . IBS (irritable bowel syndrome)   .  Migraines   . NASH (nonalcoholic steatohepatitis)    Follow[facility] by Dr. Gerald Dexter at [facility]  . RLS (restless legs syndrome)   . Trigeminal neuralgia     PAST SURGICAL HISTORY:  Past Surgical History:  Procedure Laterality Date  . ANKLE SURGERY  2006  . BREAST BIOPSY  2011   [place]  . CHOLECYSTECTOMY  2005  . CYSTOCELE REPAIR    . GASTRIC BYPASS  2008  . MASTECTOMY  2011   Breast Cancer  . RECTOCELE REPAIR    . ROTATOR CUFF REPAIR  2001  . TONSILLECTOMY  age 58  . TOTAL ABDOMINAL HYSTERECTOMY W/ BILATERAL SALPINGOOPHORECTOMY  1991  . VAGINAL DELIVERY     x3    SOCIAL HISTORY:  Social History   Tobacco Use  . Smoking status: Never Smoker  . Smokeless tobacco: Never Used  Substance Use Topics  . Alcohol use: Yes    Comment: Occasional    FAMILY HISTORY:  Family History  Problem Relation Age of Onset  . COPD Mother   . Alzheimer's disease Father   . Cancer Sister 44       Breast  . Arthritis Maternal Grandmother   . Arthritis Maternal Grandfather     DRUG ALLERGIES:  Allergies  Allergen Reactions  . Codeine Nausea And Vomiting  . Oxycodone Itching  . Silver Other (See Comments)    (Tegaderm) Blisters.  Okay on hands.    REVIEW OF SYSTEMS:   CONSTITUTIONAL: No fever, but positive for fatigue and generalized weakness.  EYES: No  changes in vision.  EARS, NOSE, AND THROAT: No tinnitus or ear pain.  RESPIRATORY: No cough, shortness of breath, wheezing or hemoptysis.  CARDIOVASCULAR: Positive for lightheadedness.  No chest pain, orthopnea, edema.  GASTROINTESTINAL: No nausea, vomiting, diarrhea or abdominal pain.  GENITOURINARY: No dysuria, hematuria.  ENDOCRINE: No polyuria, nocturia. HEMATOLOGY: No bleeding. SKIN: No rash or lesion. MUSCULOSKELETAL: Positive for generalized weakness.  No joint pain at this time.   NEUROLOGIC: No focal weakness.  PSYCHIATRY: No anxiety or depression.   MEDICATIONS AT HOME:  Prior to Admission medications   Medication Sig Start Date  End Date Taking? Authorizing Provider  albuterol (PROVENTIL HFA;VENTOLIN HFA) 108 (90 Base) MCG/ACT inhaler Inhale 2 puffs into the lungs every 6 (six) hours as needed for wheezing or shortness of breath.   Yes [provider]  atorvastatin (LIPITOR) 40 MG tablet Take 40 mg by mouth at bedtime.    Yes [provider]  Azelastine HCl 0.15 % SOLN Place 1-2 sprays into the nose 2 (two) times daily.  01/31/17  Yes [provider]  Baclofen 5 MG TABS Take 5-10 mg by mouth 3 (three) times daily. One in the morning and lunch and two at night 01/16/13  Yes Jackolyn Confer, MD  Buprenorphine (BUTRANS) 15 MCG/HR PTWK Place 15 mcg onto the skin once a week. Applies on Sunday   Yes [provider]  calcium carbonate (TUMS - DOSED IN MG ELEMENTAL CALCIUM) 500 MG chewable tablet Chew 1 tablet by mouth as needed for indigestion or heartburn.   Yes [provider]  cholecalciferol (VITAMIN D) 1000 UNITS tablet Take 1,000 Units by mouth daily.   Yes [provider]  enalapril (VASOTEC) 10 MG tablet Take 1 tablet (10 mg total) by mouth daily. 07/08/13  Yes Jackolyn Confer, MD  escitalopram (LEXAPRO) 20 MG tablet Take 20 mg by mouth daily.  07/19/17  Yes [provider]  fluticasone (FLONASE) 50 MCG/ACT nasal spray Place 1 spray into both nostrils 2 (two) times daily.    Yes [provider]  furosemide (LASIX) 20 MG tablet Take 20-40 mg by mouth daily as needed for edema. Normally takes 57m but will take an additional 211mif having swelling   Yes [provider]  gabapentin (NEURONTIN) 300 MG capsule Take 300 mg by mouth 2 (two) times daily.  11/16/17  Yes [provider]  ibandronate (BONIVA) 150 MG tablet Take 150 mg by mouth every 30 (thirty) days. Takes on the 1st of the month   Yes [provider]  lamoTRIgine (LAMICTAL) 25 MG tablet Take 25 mg by mouth 2 (two) times daily.  05/26/17  Yes [provider]   levothyroxine (SYNTHROID, LEVOTHROID) 50 MCG tablet Take 1 tablet (50 mcg total) by mouth daily. 07/08/13  Yes WaJackolyn ConferMD  meloxicam (MOBIC) 15 MG tablet Take 15 mg by mouth daily.   Yes [provider]  metoprolol succinate (TOPROL-XL) 25 MG 24 hr tablet Take 1 tablet (25 mg total) by mouth daily. 07/08/13  Yes WaJackolyn ConferMD  Multiple Vitamins-Minerals (MULTIVITAMIN WITH MINERALS) tablet Take 1 tablet by mouth daily.   Yes [provider]  oxybutynin (DITROPAN-XL) 5 MG 24 hr tablet Take 5 mg by mouth daily.   Yes [provider]  pantoprazole (PROTONIX) 40 MG tablet Take 1 tablet (40 mg total) by mouth daily. 07/08/13  Yes WaJackolyn ConferMD  potassium chloride (MICRO-K) 10 MEQ CR capsule Take 10 mEq by  mouth 2 (two) times daily.   Yes [provider]  rOPINIRole (REQUIP) 2 MG tablet Take 1 tablet (2 mg total) by mouth 2 (two) times daily. Patient taking differently: Take 2 mg by mouth 3 (three) times daily.  08/07/13  Yes Jackolyn Confer, MD  vitamin E (VITAMIN E) 1000 UNIT capsule Take 1,000 Units by mouth daily.   Yes [provider]  ALPRAZolam (XANAX) 0.25 MG tablet Take 1-2 tablets (0.25-0.5 mg total) by mouth at bedtime as needed for anxiety. Patient not taking: Reported on 11/26/2017 12/10/13   Jackolyn Confer, MD  DULoxetine (CYMBALTA) 60 MG capsule Take 1 capsule (60 mg total) by mouth daily. Patient not taking: Reported on 11/26/2017 11/06/13   Jackolyn Confer, MD  meclizine (ANTIVERT) 32 MG tablet Take 1 tablet (32 mg total) by mouth 3 (three) times daily as needed. Patient not taking: Reported on 11/26/2017 05/06/13   Sherryl Barters, NP  temazepam (RESTORIL) 15 MG capsule Take 1 capsule (15 mg total) by mouth at bedtime as needed for sleep. Patient not taking: Reported on 11/26/2017 12/10/13   Jackolyn Confer, MD      PHYSICAL EXAMINATION:   VITAL SIGNS: Blood pressure 107/72, pulse 66, temperature 98.9 F  (37.2 C), temperature source Oral, resp. rate 20, height 5' 1"  (1.549 m), weight 106.1 kg, SpO2 93 %.  GENERAL:  72 y.o.-year-old patient lying in the bed with no acute distress, currently feeling better after IV fluids.  EYES: Pupils equal, round, reactive to light and accommodation. No scleral icterus. Extraocular muscles intact.  HEENT: Head atraumatic, normocephalic. Oropharynx and nasopharynx clear.  NECK:  Supple, no jugular venous distention. No thyroid enlargement, no tenderness.  LUNGS: Normal breath sounds bilaterally, no wheezing, rales,rhonchi or crepitation. No use of accessory muscles of respiration.  CARDIOVASCULAR: S1, S2 normal. No S3/S4.  ABDOMEN: Soft, nontender, nondistended. Bowel sounds present. No organomegaly or mass.  EXTREMITIES: No pedal edema, cyanosis, or clubbing.  NEUROLOGIC: Cranial nerves II through XII are intact. Muscle strength 5/5 in all extremities. Sensation intact.   PSYCHIATRIC: The patient is alert and oriented x 3.  SKIN: No obvious rash, lesion, or ulcer.   LABORATORY PANEL:   CBC Recent Labs  Lab 01/09/18 1922  WBC 4.1  HGB 14.5  HCT 43.0  PLT 164  MCV 97.4  MCH 32.9  MCHC 33.7  RDW 13.4   ------------------------------------------------------------------------------------------------------------------  Chemistries  Recent Labs  Lab 01/09/18 1922  NA 141  K 4.6  CL 107  CO2 22  GLUCOSE 90  BUN 31*  CREATININE 2.13*  CALCIUM 9.3   ------------------------------------------------------------------------------------------------------------------ estimated creatinine clearance is 28 mL/min (A) (by C-G formula based on SCr of 2.13 mg/dL (H)). ------------------------------------------------------------------------------------------------------------------ No results for input(s): TSH, T4TOTAL, T3FREE, THYROIDAB in the last 72 hours.  Invalid input(s): FREET3   Coagulation profile No results for input(s): INR, PROTIME in the  last 168 hours. ------------------------------------------------------------------------------------------------------------------- No results for input(s): DDIMER in the last 72 hours. -------------------------------------------------------------------------------------------------------------------  Cardiac Enzymes Recent Labs  Lab 01/09/18 1922  TROPONINI <0.03   ------------------------------------------------------------------------------------------------------------------ Invalid input(s): POCBNP  ---------------------------------------------------------------------------------------------------------------  Urinalysis    Component Value Date/Time   BILIRUBINUR neg 11/12/2012 1451   PROTEINUR neg 11/12/2012 1451   UROBILINOGEN 0.2 11/12/2012 1451   NITRITE ne 11/12/2012 1451   LEUKOCYTESUR Negative 11/12/2012 1451     RADIOLOGY: Dg Chest 2 View  Result Date: 01/09/2018 CLINICAL DATA:  Weakness with multiple falls today. EXAM: CHEST - 2 VIEW COMPARISON:  01/16/2013  FINDINGS: The heart size and mediastinal contours are within normal limits. Both lungs are clear. Dextroconvex curvature of the thoracic spine. IMPRESSION: No active cardiopulmonary disease. Dextroconvex curvature of the thoracic spine. Electronically Signed   By: Ashley Royalty M.D.   On: 01/09/2018 19:40    EKG: Orders placed or performed during the hospital encounter of 01/09/18  . ED EKG within 10 minutes  . ED EKG within 10 minutes    IMPRESSION AND PLAN:  1.  Acute renal failure, likely prerenal, secondary to dehydration from diuretic use and poor p.o. fluid intake.  We will discontinue Lasix and start gentle IV hydration.  Patient was also advised to avoid all NSAIDs, will discontinue meloxicam from her home medication list.  Will check UA and kidneys ultrasound.  Continue to monitor kidney function closely and avoid nephrotoxic medications.  Nephrology is consulted for further evaluation and  treatment. 2. NASH, stable we will continue treatment per gastroenterology at [facility]. 3.  Anxiety/depression disorder, stable, continue home medications. 4.  Hypertension, stable, continue home medications. 5.  Fibromyalgia, stable, continue home medications.  All the records are reviewed and case discussed with ED provider. Management plans discussed with the patient, who is in agreement.  CODE STATUS: Full Advance Directive Documentation     Most Recent Value  Type of Advance Directive  Living will  Pre-existing out of facility DNR order (yellow form or pink MOST form)  -  "MOST" Form in Place?  -       TOTAL TIME TAKING CARE OF THIS PATIENT: 50 minutes.    Amelia Jo M.D on 01/09/2018 at 11:47 PM  Between 7am to 6pm - Pager - [phone]  After 6pm go to [email or web address] - password [email or web address] [facility] [place] [facility]  [phone]  CC: Primary care physician; Barbaraann Boys, MD

## 2018-01-09 NOTE — ED Notes (Signed)
Called to give report. Report could not be given because staff member Kennyth Lose stated that pt did not have any orders. Charge nurse called and she stated she would call the admitting doctor.

## 2018-01-09 NOTE — ED Provider Notes (Signed)
[facility] Emergency Department Provider Note  ____________________________________________  Time seen: Approximately 10:33 PM  I have reviewed the triage vital signs and the nursing notes.   HISTORY  Chief Complaint Fall   HPI Gwendolyn [name] is a 72 y.o. female with history of NASH cirrhosis and remote breast cancerwho presents for evaluation of weakness. Patient reports that her legs gave out twice today and she fell. She was walking in the house coming from the kitchen to the living room. she denies feeling dizzy. She did not injure herself and fell onto her buttocks on the ground. She reports that she has been feeling very weak. She also reports decreased urine output and feeling very thirsty. No chest pain, shortness of breath, abdominal pain, nausea vomiting, diarrhea, fever or chills, dysuria or hematuria, headache, slurred speech, facial droop, unilateral weakness or numbness. Patient denies head trauma or LOC. She is not on blood thinners. She denies back pain or neck pain, no hip pain or extremity pain.  Past Medical History:  Diagnosis Date  . Anxiety   . Arthritis   . Breast cancer ([place]) 01/2010   left mastectomy, LN neg, on femara  . Chronic low back pain   . Depression   . Fibromyalgia   . GERD (gastroesophageal reflux disease)   . History of chicken pox   . HTN (hypertension)   . Hyperlipidemia   . Hypothyroidism   . IBS (irritable bowel syndrome)   . Migraines   . NASH (nonalcoholic steatohepatitis)    Followed by Dr. Gerald Dexter at [facility]  . RLS (restless legs syndrome)   . Trigeminal neuralgia     Patient Active Problem List   Diagnosis Date Noted  . Chronic venous insufficiency 11/28/2017  . Lymphedema 11/28/2017  . Leg pain 11/28/2017  . GERD (gastroesophageal reflux disease) 11/28/2017  . Unspecified hypothyroidism 12/10/2013  . Medicare annual wellness visit, subsequent 07/08/2013  . Weakness of both legs 07/08/2013  . Night  sweats 01/16/2013  . Tremor 01/16/2013  . Palpitations 01/16/2013  . Fibromyalgia 07/22/2012  . Essential hypertension, benign 06/19/2012  . Bereavement 06/19/2012  . Depression 03/28/2012  . Insomnia 03/28/2012  . Lumbar back pain 12/22/2011  . HX: breast cancer 09/18/2011  . Abnormal CT lung screening 08/07/2011  . NASH (nonalcoholic steatohepatitis) 06/12/2011    Past Surgical History:  Procedure Laterality Date  . ANKLE SURGERY  2006  . BREAST BIOPSY  [place]   [place]  . CHOLECYSTECTOMY  2005  . CYSTOCELE REPAIR    . GASTRIC BYPASS  2008  . MASTECTOMY  2011   Breast Cancer  . RECTOCELE REPAIR    . ROTATOR CUFF REPAIR  2001  . TONSILLECTOMY  age 24  . TOTAL ABDOMINAL HYSTERECTOMY W/ BILATERAL SALPINGOOPHORECTOMY  1991  . VAGINAL DELIVERY     x3    Prior to Admission medications   Medication Sig Start Date End Date Taking? Authorizing Provider  albuterol (PROVENTIL HFA;VENTOLIN HFA) 108 (90 Base) MCG/ACT inhaler Inhale 2 puffs into the lungs every 6 (six) hours as needed for wheezing or shortness of breath.    [provider]  ALPRAZolam Duanne Moron) 0.25 MG tablet Take 1-2 tablets (0.25-0.5 mg total) by mouth at bedtime as needed for anxiety. Patient not taking: Reported on 11/26/2017 12/10/13   Jackolyn Confer, MD  atorvastatin (LIPITOR) 40 MG tablet Take 40 mg by mouth daily.    [provider]  Azelastine HCl 0.15 % SOLN USE 1-2 SPRAYS EACH NOSTRIL  TWICE DAILY 01/31/17   [provider]  baclofen (LIORESAL) 10 MG tablet Take 10 mg by mouth at bedtime. One in the morning and two at night 01/16/13   Jackolyn Confer, MD  buprenorphine (BUTRANS) 10 MCG/HR PTWK patch Place 10 mcg onto the skin once a week.    [provider]  CALCIUM PO Take 1 tablet by mouth daily.    [provider]  cholecalciferol (VITAMIN D) 1000 UNITS tablet Take 1,000 Units by mouth daily.    [provider]  DULoxetine (CYMBALTA) 60 MG capsule Take 1  capsule (60 mg total) by mouth daily. Patient not taking: Reported on 11/26/2017 11/06/13   Jackolyn Confer, MD  enalapril (VASOTEC) 10 MG tablet Take 1 tablet (10 mg total) by mouth daily. 07/08/13   Jackolyn Confer, MD  escitalopram (LEXAPRO) 20 MG tablet Take by mouth. 07/19/17   [provider]  fluticasone (FLONASE) 50 MCG/ACT nasal spray fluticasone propionate 50 mcg/actuation nasal spray,suspension  USE 2 SPRAYS IN EACH NOSTRIL ONCE A DAY    [provider]  furosemide (LASIX) 20 MG tablet Take by mouth.    [provider]  gabapentin (NEURONTIN) 300 MG capsule Take 2 capsules (690m) in the morning and afternoon and take 3 capsules (9041m in evening. 11/16/17   [provider]  ibandronate (BONIVA) 150 MG tablet Take by mouth. 07/19/17   [provider]  lamoTRIgine (LAMICTAL) 25 MG tablet lamotrigine 25 mg tablet  TAKE 1 TABLET TWICE A DAY 05/26/17   [provider]  levothyroxine (SYNTHROID, LEVOTHROID) 50 MCG tablet Take 1 tablet (50 mcg total) by mouth daily. 07/08/13   WaJackolyn ConferMD  meclizine (ANTIVERT) 32 MG tablet Take 1 tablet (32 mg total) by mouth 3 (three) times daily as needed. Patient not taking: Reported on 11/26/2017 05/06/13   ReSherryl BartersNP  metoprolol succinate (TOPROL-XL) 25 MG 24 hr tablet Take 1 tablet (25 mg total) by mouth daily. 07/08/13   WaJackolyn ConferMD  Multiple Vitamins-Minerals (MULTIVITAMIN WITH MINERALS) tablet Take 1 tablet by mouth daily.    [provider]  pantoprazole (PROTONIX) 40 MG tablet Take 1 tablet (40 mg total) by mouth daily. 07/08/13   WaJackolyn ConferMD  rOPINIRole (REQUIP) 2 MG tablet Take 1 tablet (2 mg total) by mouth 2 (two) times daily. 08/07/13   WaJackolyn ConferMD  temazepam (RESTORIL) 15 MG capsule Take 1 capsule (15 mg total) by mouth at bedtime as needed for sleep. Patient not taking: Reported on 11/26/2017 12/10/13   WaJackolyn ConferMD  vitamin E  (VITAMIN E) 1000 UNIT capsule Take 1,000 Units by mouth daily.    [provider]  vitamin E 1000 UNIT capsule vitamin E (dl, acetate) 1,000 unit capsule  Take by oral route.    [provider]    Allergies Codeine; Oxycodone; and Silver  Family History  Problem Relation Age of Onset  . COPD Mother   . Alzheimer's disease Father   . Cancer Sister [id number]     Breast  . Arthritis Maternal Grandmother   . Arthritis Maternal Grandfather     Social History Social History   Tobacco Use  . Smoking status: Never Smoker  . Smokeless tobacco: Never Used  Substance Use Topics  . Alcohol use: Yes    Comment: Occasional  . Drug use: No    Review of Systems Constitutional: Negative for fever. + generalized weakness  Eyes: Negative for visual changes. ENT: Negative for facial injury or neck injury Cardiovascular: Negative for chest injury. Respiratory: Negative for shortness of breath. Negative for chest wall injury. Gastrointestinal: Negative for abdominal pain or injury. Genitourinary: Negative for dysuria. Musculoskeletal: Negative for back injury, negative for arm or leg pain. Skin: Negative for laceration/abrasions. Neurological: Negative for head injury.   ____________________________________________   PHYSICAL EXAM:  VITAL SIGNS: ED Triage Vitals  Enc Vitals Group     BP 01/09/18 1910 98/72     Pulse Rate 01/09/18 1910 68     Resp 01/09/18 1910 20     Temp 01/09/18 1910 98.9 F (37.2 C)     Temp Source 01/09/18 1910 Oral     SpO2 01/09/18 1910 99 %     Weight 01/09/18 1911 234 lb (106.1 kg)     Height 01/09/18 1911 5' 1"  (1.549 m)     Head Circumference --      Peak Flow --      Pain Score 01/09/18 1911 0     Pain Loc --      Pain Edu? --      Excl. in [place]? --     Constitutional: Alert and oriented. No acute distress. Does not appear intoxicated. HEENT Head: Normocephalic and atraumatic. Face: No facial bony tenderness. Stable midface Ears:  No hemotympanum bilaterally. No Battle sign Eyes: No eye injury. PERRL. No raccoon eyes Nose: Nontender. No epistaxis. No rhinorrhea Mouth/Throat: Mucous membranes are dry. No oropharyngeal blood. No dental injury. Airway patent without stridor. Normal voice. Neck: C-collar in place. No midline c-spine tenderness.  Cardiovascular: Normal rate, regular rhythm. Normal and symmetric distal pulses are present in all extremities. Pulmonary/Chest: Chest wall is stable and nontender to palpation/compression. Normal respiratory effort. Breath sounds are normal. No crepitus.  Abdominal: Soft, nontender, non distended. Musculoskeletal: Nontender with normal full range of motion in all extremities. No deformities. No thoracic or lumbar midline spinal tenderness. Pelvis is stable. Skin: Skin is warm, dry and intact. No abrasions or contutions. Psychiatric: Speech and behavior are appropriate. Neurological: Normal speech and language. Moves all extremities to command. No gross focal neurologic deficits are appreciated.  Glascow Coma Score: 4 - Opens eyes on own 6 - Follows simple motor commands 5 - Alert and oriented GCS: 15  ____________________________________________   LABS (all labs ordered are listed, but only abnormal results are displayed)  Labs Reviewed  BASIC METABOLIC PANEL - Abnormal; Notable for the following components:      Result Value   BUN 31 (*)    Creatinine, Ser 2.13 (*)    GFR calc non Af Amer 23 (*)    GFR calc Af Amer 26 (*)    All other components within normal limits  CBC  TROPONIN I   ____________________________________________  EKG  ED ECG REPORT I, Rudene Re, the attending physician, personally viewed and interpreted this ECG.  Normal sinus rhythm, rate of 66, normal intervals, normal axis, no ST elevations or depressions, T-wave inversion in lead 3. no changes when compared to prior from 2014   ____________________________________________  RADIOLOGY  I have personally reviewed the images performed during this visit and I agree with the Radiologist's read.   Interpretation by Radiologist:  Dg Chest 2 View  Result Date: 01/09/2018 CLINICAL DATA:  Weakness with multiple falls today. EXAM: CHEST - 2 VIEW COMPARISON:  01/16/2013 FINDINGS: The heart size and mediastinal contours are within normal limits. Both lungs are clear. Dextroconvex curvature of the  thoracic spine. IMPRESSION: No active cardiopulmonary disease. Dextroconvex curvature of the thoracic spine. Electronically Signed   By: Ashley Royalty M.D.   On: 01/09/2018 19:40     ____________________________________________   PROCEDURES  Procedure(s) performed: None Procedures Critical Care performed:  None ____________________________________________   INITIAL IMPRESSION / ASSESSMENT AND PLAN / ED COURSE  72 y.o. female with history of NASH cirrhosis and remote breast cancerwho presents for evaluation of generalized weakness and two falls today. No trauma sustained. Patient looks dry on exam. vitals showing a low blood pressure of 98/72 but afebrile and normal heart rate. Physical exams otherwise were no acute findings. EKG with no evidence of dysrhythmias or ischemia. Labs showing acute on chronic kidney injury with a GFR of 23 (baseline GFR is 57-60). patient does endorse decreased urine output and feeling more thirsty than normal. Unclear etiology of her symptoms but differential includes dehydration versus hepatorenal syndrome in the setting of cirrhosis. Will give IV fluids and admitted to the hospitalist service for further management.      As part of my medical decision making, I reviewed the following data within the [place] notes reviewed and incorporated, Labs reviewed , EKG interpreted , Old EKG reviewed, Old chart reviewed, Discussed with admitting physician , Notes from prior ED visits  and [place] Controlled Substance Database    Pertinent labs & imaging results that were available during my care of the patient were reviewed by me and considered in my medical decision making (see chart for details).    ____________________________________________   FINAL CLINICAL IMPRESSION(S) / ED DIAGNOSES  Final diagnoses:  Acute kidney injury superimposed on chronic kidney disease ([place])  Fall, initial encounter  Generalized weakness      NEW MEDICATIONS STARTED DURING THIS VISIT:  ED Discharge Orders    None       Note:  This document was prepared using Dragon voice recognition software and may include unintentional dictation errors.    Alfred Levins, [place], MD 01/09/18 2239

## 2018-01-09 NOTE — ED Notes (Signed)
Pt is resting in bed respirations even/unlabored. Nadn.

## 2018-01-09 NOTE — ED Triage Notes (Signed)
Pt ambulatory to triage.  Pt reports falling twice today. Denies injury.  No loc  No chest pain or sob.  No dizzy. No headache.    Pt states she feels weak.  Pt alert.  Speech clear.

## 2018-01-10 ENCOUNTER — Inpatient Hospital Stay: Payer: Medicare Other

## 2018-01-10 LAB — URINALYSIS, COMPLETE (UACMP) WITH MICROSCOPIC
Bacteria, UA: NONE SEEN
Bilirubin Urine: NEGATIVE
GLUCOSE, UA: NEGATIVE mg/dL
HGB URINE DIPSTICK: NEGATIVE
KETONES UR: NEGATIVE mg/dL
Nitrite: NEGATIVE
PROTEIN: NEGATIVE mg/dL
Specific Gravity, Urine: 1.005 (ref 1.005–1.030)
pH: 5 (ref 5.0–8.0)

## 2018-01-10 LAB — BASIC METABOLIC PANEL
Anion gap: 9 (ref 5–15)
BUN: 29 mg/dL — ABNORMAL HIGH (ref 8–23)
CHLORIDE: 110 mmol/L (ref 98–111)
CO2: 26 mmol/L (ref 22–32)
CREATININE: 1.65 mg/dL — AB (ref 0.44–1.00)
Calcium: 9 mg/dL (ref 8.9–10.3)
GFR calc Af Amer: 36 mL/min — ABNORMAL LOW (ref >60)
GFR calc non Af Amer: 31 mL/min — ABNORMAL LOW (ref >60)
Glucose, Bld: 99 mg/dL (ref 70–99)
POTASSIUM: 4.7 mmol/L (ref 3.5–5.1)
SODIUM: 145 mmol/L (ref 135–145)

## 2018-01-10 LAB — GLUCOSE, CAPILLARY
Glucose-Capillary: 68 mg/dL — ABNORMAL LOW (ref 70–99)
Glucose-Capillary: 99 mg/dL (ref 70–99)

## 2018-01-10 LAB — CBC
HCT: 39.2 % (ref 35.0–47.0)
Hemoglobin: 13.2 g/dL (ref 12.0–16.0)
MCH: 32.6 pg (ref 26.0–34.0)
MCHC: 33.8 g/dL (ref 32.0–36.0)
MCV: 96.6 fL (ref 80.0–100.0)
PLATELETS: 137 10*3/uL — AB (ref 150–440)
RBC: 4.06 MIL/uL (ref 3.80–5.20)
RDW: 13.3 % (ref 11.5–14.5)
WBC: 3.3 10*3/uL — ABNORMAL LOW (ref 3.6–11.0)

## 2018-01-10 LAB — PROTEIN / CREATININE RATIO, URINE
CREATININE, URINE: 46 mg/dL
Total Protein, Urine: <6 mg/dL

## 2018-01-10 MED ORDER — ROPINIROLE HCL 1 MG PO TABS
2.0000 mg | ORAL_TABLET | Freq: Three times a day (TID) | ORAL | Status: DC
  Administered 2018-01-10 – 2018-01-11 (×4): 2 mg via ORAL
  Filled 2018-01-10 (×4): qty 2

## 2018-01-10 MED ORDER — AZELASTINE HCL 0.1 % NA SOLN
1.0000 | Freq: Two times a day (BID) | NASAL | Status: DC
  Filled 2018-01-10: qty 30

## 2018-01-10 MED ORDER — LEVOTHYROXINE SODIUM 50 MCG PO TABS
50.0000 ug | ORAL_TABLET | Freq: Every day | ORAL | Status: DC
  Administered 2018-01-10 – 2018-01-11 (×2): 50 ug via ORAL
  Filled 2018-01-10 (×2): qty 1

## 2018-01-10 MED ORDER — VITAMIN D 1000 UNITS PO TABS
1000.0000 [IU] | ORAL_TABLET | Freq: Every day | ORAL | Status: DC
  Administered 2018-01-10 – 2018-01-11 (×2): 1000 [IU] via ORAL
  Filled 2018-01-10 (×2): qty 1

## 2018-01-10 MED ORDER — BUPRENORPHINE 15 MCG/HR TD PTWK: 15.0000 ug | MEDICATED_PATCH | TRANSDERMAL | Status: DC

## 2018-01-10 MED ORDER — ACETAMINOPHEN 325 MG PO TABS: 650.0000 mg | ORAL_TABLET | Freq: Four times a day (QID) | ORAL | Status: DC | PRN

## 2018-01-10 MED ORDER — VITAMIN E 45 MG (100 UNIT) PO CAPS
1000.0000 [IU] | ORAL_CAPSULE | Freq: Every day | ORAL | Status: DC
  Administered 2018-01-10 – 2018-01-11 (×2): 1000 [IU] via ORAL
  Filled 2018-01-10 (×2): qty 2

## 2018-01-10 MED ORDER — TRAZODONE HCL 50 MG PO TABS: 25.0000 mg | ORAL_TABLET | Freq: Every evening | ORAL | Status: DC | PRN

## 2018-01-10 MED ORDER — SODIUM CHLORIDE 0.9 % IV SOLN
INTRAVENOUS | Status: DC
  Administered 2018-01-10: 04:00:00 via INTRAVENOUS

## 2018-01-10 MED ORDER — IBANDRONATE SODIUM 150 MG PO TABS: 150.0000 mg | ORAL_TABLET | ORAL | Status: DC

## 2018-01-10 MED ORDER — ONDANSETRON HCL 4 MG PO TABS: 4.0000 mg | ORAL_TABLET | Freq: Four times a day (QID) | ORAL | Status: DC | PRN

## 2018-01-10 MED ORDER — ADULT MULTIVITAMIN W/MINERALS CH
1.0000 | ORAL_TABLET | Freq: Every day | ORAL | Status: DC
  Administered 2018-01-10 – 2018-01-11 (×2): 1 via ORAL
  Filled 2018-01-10 (×2): qty 1

## 2018-01-10 MED ORDER — ESCITALOPRAM OXALATE 10 MG PO TABS
20.0000 mg | ORAL_TABLET | Freq: Every day | ORAL | Status: DC
  Administered 2018-01-10 – 2018-01-11 (×2): 20 mg via ORAL
  Filled 2018-01-10 (×2): qty 2

## 2018-01-10 MED ORDER — CALCIUM CARBONATE ANTACID 500 MG PO CHEW: 1.0000 | CHEWABLE_TABLET | ORAL | Status: DC | PRN

## 2018-01-10 MED ORDER — ALBUTEROL SULFATE (2.5 MG/3ML) 0.083% IN NEBU: 2.5000 mg | INHALATION_SOLUTION | Freq: Four times a day (QID) | RESPIRATORY_TRACT | Status: DC | PRN

## 2018-01-10 MED ORDER — POTASSIUM CHLORIDE CRYS ER 10 MEQ PO TBCR
10.0000 meq | EXTENDED_RELEASE_TABLET | Freq: Every day | ORAL | Status: DC
  Administered 2018-01-10 – 2018-01-11 (×2): 10 meq via ORAL
  Filled 2018-01-10 (×2): qty 1

## 2018-01-10 MED ORDER — ACETAMINOPHEN 650 MG RE SUPP: 650.0000 mg | Freq: Four times a day (QID) | RECTAL | Status: DC | PRN

## 2018-01-10 MED ORDER — GABAPENTIN 300 MG PO CAPS
300.0000 mg | ORAL_CAPSULE | Freq: Two times a day (BID) | ORAL | Status: DC
  Administered 2018-01-10 – 2018-01-11 (×3): 300 mg via ORAL
  Filled 2018-01-10 (×3): qty 1

## 2018-01-10 MED ORDER — PANTOPRAZOLE SODIUM 40 MG PO TBEC
40.0000 mg | DELAYED_RELEASE_TABLET | Freq: Every day | ORAL | Status: DC
  Administered 2018-01-10 – 2018-01-11 (×2): 40 mg via ORAL
  Filled 2018-01-10 (×2): qty 1

## 2018-01-10 MED ORDER — BISACODYL 5 MG PO TBEC: 5.0000 mg | DELAYED_RELEASE_TABLET | Freq: Every day | ORAL | Status: DC | PRN

## 2018-01-10 MED ORDER — HEPARIN SODIUM (PORCINE) 5000 UNIT/ML IJ SOLN
5000.0000 [IU] | Freq: Three times a day (TID) | INTRAMUSCULAR | Status: DC
  Administered 2018-01-10: 5000 [IU] via SUBCUTANEOUS
  Filled 2018-01-10 (×2): qty 1

## 2018-01-10 MED ORDER — ONDANSETRON HCL 4 MG/2ML IJ SOLN: 4.0000 mg | Freq: Four times a day (QID) | INTRAMUSCULAR | Status: DC | PRN

## 2018-01-10 MED ORDER — METOPROLOL SUCCINATE ER 25 MG PO TB24
25.0000 mg | ORAL_TABLET | Freq: Every day | ORAL | Status: DC
  Administered 2018-01-10 – 2018-01-11 (×2): 25 mg via ORAL
  Filled 2018-01-10 (×2): qty 1

## 2018-01-10 MED ORDER — DOCUSATE SODIUM 100 MG PO CAPS
100.0000 mg | ORAL_CAPSULE | Freq: Two times a day (BID) | ORAL | Status: DC
  Administered 2018-01-10 – 2018-01-11 (×3): 100 mg via ORAL
  Filled 2018-01-10 (×3): qty 1

## 2018-01-10 MED ORDER — FLUTICASONE PROPIONATE 50 MCG/ACT NA SUSP
1.0000 | Freq: Two times a day (BID) | NASAL | Status: DC
  Administered 2018-01-10 – 2018-01-11 (×3): 1 via NASAL
  Filled 2018-01-10: qty 16

## 2018-01-10 MED ORDER — LAMOTRIGINE 25 MG PO TABS
25.0000 mg | ORAL_TABLET | Freq: Two times a day (BID) | ORAL | Status: DC
  Administered 2018-01-10 – 2018-01-11 (×3): 25 mg via ORAL
  Filled 2018-01-10 (×3): qty 1

## 2018-01-10 MED ORDER — ATORVASTATIN CALCIUM 20 MG PO TABS
40.0000 mg | ORAL_TABLET | Freq: Every day | ORAL | Status: DC
  Administered 2018-01-10: 40 mg via ORAL
  Filled 2018-01-10: qty 2

## 2018-01-10 NOTE — Consult Note (Signed)
Date: 01/10/2018                  Patient Name:  Gwendolyn [name]  MRN: [id number]  DOB: Apr 20, 1945  Age / Sex: 72 y.o., female         PCP: Barbaraann Boys, MD/ [facility]                 Service Requesting Consult: IM/ Nicholes Mango, MD                 Reason for Consult: ARF            History of Present Illness: Patient is a 72 y.o. female with medical problems of nonalcoholic cirrhosis, hypertension, fibromyalgia, anxiety and depression, irritable bowel syndrome, remote history of breast cancer left mastectomy, who was admitted to [facility] on 01/09/2018 for evaluation of falling twice at home.  Evaluation in the emergency room was negative for trauma.  She was hypotensive and found to have elevated creatinine.  Therefore she was admitted for further evaluation and management Baseline creatinine 1.0/GFR 57 from November 29, 2017 Admission creatinine 2.13 which has improved to 1.65 this morning with IV hydration This morning, patient feels well.  She is eating a breakfast Some leg edema No shortness of breath   Medications: Outpatient medications: Medications Prior to Admission  Medication Sig Dispense Refill Last Dose  . albuterol (PROVENTIL HFA;VENTOLIN HFA) 108 (90 Base) MCG/ACT inhaler Inhale 2 puffs into the lungs every 6 (six) hours as needed for wheezing or shortness of breath.   prn at prn  . atorvastatin (LIPITOR) 40 MG tablet Take 40 mg by mouth at bedtime.    01/08/2018 at Unknown time  . Azelastine HCl 0.15 % SOLN Place 1-2 sprays into the nose 2 (two) times daily.    01/09/2018 at Unknown time  . Baclofen 5 MG TABS Take 5-10 mg by mouth 3 (three) times daily. One in the morning and lunch and two at night   01/09/2018 at Unknown time  . Buprenorphine (BUTRANS) 15 MCG/HR PTWK Place 15 mcg onto the skin once a week. Applies on Sunday   01/06/2018 at Unknown time  . calcium carbonate (TUMS - DOSED IN MG ELEMENTAL CALCIUM) 500 MG chewable tablet Chew 1 tablet by mouth  as needed for indigestion or heartburn.   prn at prn  . cholecalciferol (VITAMIN D) 1000 UNITS tablet Take 1,000 Units by mouth daily.   01/09/2018 at Unknown time  . enalapril (VASOTEC) 10 MG tablet Take 1 tablet (10 mg total) by mouth daily. 90 tablet 4 01/09/2018 at Unknown time  . escitalopram (LEXAPRO) 20 MG tablet Take 20 mg by mouth daily.    01/09/2018 at Unknown time  . fluticasone (FLONASE) 50 MCG/ACT nasal spray Place 1 spray into both nostrils 2 (two) times daily.    01/09/2018 at Unknown time  . furosemide (LASIX) 20 MG tablet Take 20-40 mg by mouth daily as needed for edema. Normally takes 50m but will take an additional 258mif having swelling   prn at prn  . gabapentin (NEURONTIN) 300 MG capsule Take 300 mg by mouth 2 (two) times daily.    01/09/2018 at Unknown time  . ibandronate (BONIVA) 150 MG tablet Take 150 mg by mouth every 30 (thirty) days. Takes on the 1st of the month   Past Month at Unknown time  . lamoTRIgine (LAMICTAL) 25 MG tablet Take 25 mg by mouth 2 (two) times daily.  01/09/2018 at Unknown time  . levothyroxine (SYNTHROID, LEVOTHROID) 50 MCG tablet Take 1 tablet (50 mcg total) by mouth daily. 90 tablet 4 01/09/2018 at Unknown time  . meloxicam (MOBIC) 15 MG tablet Take 15 mg by mouth daily.   01/09/2018 at Unknown time  . metoprolol succinate (TOPROL-XL) 25 MG 24 hr tablet Take 1 tablet (25 mg total) by mouth daily. 90 tablet 3 01/09/2018 at 0930  . Multiple Vitamins-Minerals (MULTIVITAMIN WITH MINERALS) tablet Take 1 tablet by mouth daily.   01/09/2018 at Unknown time  . oxybutynin (DITROPAN-XL) 5 MG 24 hr tablet Take 5 mg by mouth daily.   01/09/2018 at Unknown time  . pantoprazole (PROTONIX) 40 MG tablet Take 1 tablet (40 mg total) by mouth daily. 90 tablet 3 01/09/2018 at Unknown time  . potassium chloride (MICRO-K) 10 MEQ CR capsule Take 10 mEq by mouth 2 (two) times daily.   01/09/2018 at Unknown time  . rOPINIRole (REQUIP) 2 MG tablet Take 1 tablet (2 mg total) by mouth  2 (two) times daily. (Patient taking differently: Take 2 mg by mouth 3 (three) times daily. ) 180 tablet 2 01/09/2018 at Unknown time  . vitamin E (VITAMIN E) 1000 UNIT capsule Take 1,000 Units by mouth daily.   01/09/2018 at Unknown time  . ALPRAZolam (XANAX) 0.25 MG tablet Take 1-2 tablets (0.25-0.5 mg total) by mouth at bedtime as needed for anxiety. (Patient not taking: Reported on 11/26/2017) 60 tablet 4 Not Taking at Unknown time  . DULoxetine (CYMBALTA) 60 MG capsule Take 1 capsule (60 mg total) by mouth daily. (Patient not taking: Reported on 11/26/2017) 90 capsule 0 Not Taking at Unknown time  . meclizine (ANTIVERT) 32 MG tablet Take 1 tablet (32 mg total) by mouth 3 (three) times daily as needed. (Patient not taking: Reported on 11/26/2017) 30 tablet 1 Not Taking at Unknown time  . temazepam (RESTORIL) 15 MG capsule Take 1 capsule (15 mg total) by mouth at bedtime as needed for sleep. (Patient not taking: Reported on 11/26/2017) 30 capsule 3 Not Taking at Unknown time    Current medications: Current Facility-Administered Medications  Medication Dose Route Frequency Provider Last Rate Last Dose  . 0.9 %  sodium chloride infusion   Intravenous Continuous Amelia Jo, MD 75 mL/hr at 01/10/18 0416    . acetaminophen (TYLENOL) tablet 650 mg  650 mg Oral Q6H PRN Amelia Jo, MD       Or  . acetaminophen (TYLENOL) suppository 650 mg  650 mg Rectal Q6H PRN Amelia Jo, MD      . albuterol (PROVENTIL) (2.5 MG/3ML) 0.083% nebulizer solution 2.5 mg  2.5 mg Inhalation Q6H PRN Amelia Jo, MD      . atorvastatin (LIPITOR) tablet 40 mg  40 mg Oral QHS Amelia Jo, MD      . azelastine (ASTELIN) 0.1 % nasal spray 1-2 spray  1-2 spray Each Nare BID Amelia Jo, MD      . bisacodyl (DULCOLAX) EC tablet 5 mg  5 mg Oral Daily PRN Amelia Jo, MD      . Derrill Memo ON 01/13/2018] Buprenorphine PTWK 15 mcg  15 mcg Transdermal Weekly Amelia Jo, MD      . calcium carbonate (TUMS - dosed in mg elemental  calcium) chewable tablet 200 mg of elemental calcium  1 tablet Oral PRN Amelia Jo, MD      . cholecalciferol (VITAMIN D) tablet 1,000 Units  1,000 Units Oral Daily Amelia Jo, MD      .  docusate sodium (COLACE) capsule 100 mg  100 mg Oral BID Amelia Jo, MD      . escitalopram (LEXAPRO) tablet 20 mg  20 mg Oral Daily Amelia Jo, MD      . fluticasone Asencion Islam) 50 MCG/ACT nasal spray 1 spray  1 spray Each Nare BID Amelia Jo, MD      . gabapentin (NEURONTIN) capsule 300 mg  300 mg Oral BID Amelia Jo, MD      . heparin injection 5,000 Units  5,000 Units Subcutaneous Q8H Amelia Jo, MD      . lamoTRIgine (LAMICTAL) tablet 25 mg  25 mg Oral BID Amelia Jo, MD      . levothyroxine (SYNTHROID, LEVOTHROID) tablet 50 mcg  50 mcg Oral QAC breakfast Amelia Jo, MD      . metoprolol succinate (TOPROL-XL) 24 hr tablet 25 mg  25 mg Oral Daily Amelia Jo, MD      . multivitamin with minerals tablet 1 tablet  1 tablet Oral Daily Amelia Jo, MD      . ondansetron [facility]) tablet 4 mg  4 mg Oral Q6H PRN Amelia Jo, MD       Or  . ondansetron [facility]) injection 4 mg  4 mg Intravenous Q6H PRN Amelia Jo, MD      . pantoprazole (PROTONIX) EC tablet 40 mg  40 mg Oral Daily Amelia Jo, MD      . potassium chloride (K-DUR,KLOR-CON) CR tablet 10 mEq  10 mEq Oral Daily Amelia Jo, MD      . rOPINIRole (REQUIP) tablet 2 mg  2 mg Oral TID Amelia Jo, MD      . traZODone (DESYREL) tablet 25 mg  25 mg Oral QHS PRN Amelia Jo, MD      . vitamin E capsule 1,000 Units  1,000 Units Oral Daily Amelia Jo, MD          Allergies: Allergies  Allergen Reactions  . Codeine Nausea And Vomiting  . Oxycodone Itching  . Silver Other (See Comments)    (Tegaderm) Blisters.  Okay on hands.      Past Medical History: Past Medical History:  Diagnosis Date  . Anxiety   . Arthritis   . Breast cancer ([place]) 01/2010   left mastectomy, LN neg, on femara  . Chronic low back  pain   . Depression   . Fibromyalgia   . GERD (gastroesophageal reflux disease)   . History of chicken pox   . HTN (hypertension)   . Hyperlipidemia   . Hypothyroidism   . IBS (irritable bowel syndrome)   . Migraines   . NASH (nonalcoholic steatohepatitis)    Followed by Dr. Gerald Dexter at [facility]  . RLS (restless legs syndrome)   . Trigeminal neuralgia      Past Surgical History: Past Surgical History:  Procedure Laterality Date  . ANKLE SURGERY  2006  . BREAST BIOPSY  2011   [place]  . CHOLECYSTECTOMY  2005  . CYSTOCELE REPAIR    . GASTRIC BYPASS  2008  . MASTECTOMY  2011   Breast Cancer  . RECTOCELE REPAIR    . ROTATOR CUFF REPAIR  2001  . TONSILLECTOMY  age 67  . TOTAL ABDOMINAL HYSTERECTOMY W/ BILATERAL SALPINGOOPHORECTOMY  1991  . VAGINAL DELIVERY     x3     Family History: Family History  Problem Relation Age of Onset  . COPD Mother   . Alzheimer's disease Father   . Cancer Sister 61  Breast  . Arthritis Maternal Grandmother   . Arthritis Maternal Grandfather      Social History: Social History   Socioeconomic History  . Marital status: Married    Spouse name: Not on file  . Number of children: 2  . Years of education: Not on file  . Highest education level: Not on file  Occupational History  . 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 Financial resource strain: Not on file  . Food insecurity:    Worry: Not on file    Inability: Not on file  . Transportation needs:    Medical: Not on file    Non-medical: Not on file  Tobacco Use  . Smoking status: Never Smoker  . Smokeless tobacco: Never Used  Substance and Sexual Activity  . Alcohol use: Yes    Comment: Occasional  . Drug use: No  . Sexual activity: Not on file  Lifestyle  . Physical activity:    Days per week: Not on file    Minutes per session: Not on file  . Stress: Not on file  Relationships  . Social connections:    Talks on phone: Not on file    Gets  together: Not on file    Attends religious service: Not on file    Active member of club or organization: Not on file    Attends meetings of clubs or organizations: Not on file    Relationship status: Not on file  . Intimate partner violence:    Fear of current or ex partner: Not on file    Emotionally abused: Not on file    Physically abused: Not on file    Forced sexual activity: Not on file  Other Topics Concern  . Not on file  Social History Narrative   Lives with husband who has MS.      Regular Exercise -  Yes, water aerobics 3 times a week   Daily Caffeine Use:  NO              Review of Systems: Gen: No fevers or chills HEENT: No vision or hearing problems CV: No shortness of breath or chest pain, chronic edema, wears compression socks Resp: No cough or sputum production GI: Appetite has been good, has history of cirrhosis GU : No blood in the urine, no voiding problems MS: No joint swellings or joint pains Derm:  No complaints Psych: No complaints Heme: No complaints Neuro: No complaints Endocrine.  No complaints  Vital Signs: Blood pressure (!) 89/50, pulse (!) 58, temperature 97.7 F (36.5 C), temperature source Oral, resp. rate 16, height 5' 1"  (1.549 m), weight 107.1 kg, SpO2 96 %.  No intake or output data in the 24 hours ending 01/10/18 0834  Weight trends: Danley Danker Weights   01/09/18 1911 01/10/18 0048  Weight: 106.1 kg 107.1 kg    Physical Exam: General:  No acute distress, laying in the bed  HEENT  anicteric, moist oral mucous membranes  Neck:  Supple, no masses  Lungs:  Mild basilar crackles, normal breathing effort, room air  Heart::  Regular rhythm  Abdomen:  Soft, nontender  Extremities:  1+ pitting edema bilaterally  Neurologic:  Alert, oriented  Skin:  No acute rashes             Lab results: Basic Metabolic Panel: Recent Labs  Lab 01/09/18 1922 01/10/18 0311  NA 141 145  K 4.6 4.7  CL 107 110  CO2 22 26  GLUCOSE 90 99   BUN 31* 29*  CREATININE 2.13* 1.65*  CALCIUM 9.3 9.0    Liver Function Tests: No results for input(s): AST, ALT, ALKPHOS, BILITOT, PROT, ALBUMIN in the last 168 hours. No results for input(s): LIPASE, AMYLASE in the last 168 hours. No results for input(s): AMMONIA in the last 168 hours.  CBC: Recent Labs  Lab 01/09/18 1922 01/10/18 0311  WBC 4.1 3.3*  HGB 14.5 13.2  HCT 43.0 39.2  MCV 97.4 96.6  PLT 164 137*    Cardiac Enzymes: Recent Labs  Lab 01/09/18 1922  TROPONINI <0.03    BNP: Invalid input(s): POCBNP  CBG: Recent Labs  Lab 01/10/18 6546  TKPTWS 56*    Microbiology: No results found for this or any previous visit (from the past 720 hour(s)).   Coagulation Studies: No results for input(s): LABPROT, INR in the last 72 hours.  Urinalysis: No results for input(s): COLORURINE, LABSPEC, PHURINE, GLUCOSEU, HGBUR, BILIRUBINUR, KETONESUR, PROTEINUR, UROBILINOGEN, NITRITE, LEUKOCYTESUR in the last 72 hours.  Invalid input(s): APPERANCEUR      Imaging: Dg Chest 2 View  Result Date: 01/09/2018 CLINICAL DATA:  Weakness with multiple falls today. EXAM: CHEST - 2 VIEW COMPARISON:  01/16/2013 FINDINGS: The heart size and mediastinal contours are within normal limits. Both lungs are clear. Dextroconvex curvature of the thoracic spine. IMPRESSION: No active cardiopulmonary disease. Dextroconvex curvature of the thoracic spine. Electronically Signed   By: Ashley Royalty M.D.   On: 01/09/2018 19:40      Assessment & Plan: Pt is a 72 y.o. Caucasian  female with medical problems of nonalcoholic cirrhosis, hypertension, fibromyalgia, anxiety and depression, irritable bowel syndrome, remote history of breast cancer left mastectomy, who was admitted to [facility] on 01/09/2018 for evaluation of falling twice at home.   1.  Acute renal failure Multifactorial.  Likely from hypotension and possibly volume depletion.  Serum creatinine is improving with hemodynamic stability and IV  hydration.  Urinalysis not available.  Renal ultrasound is pending Patient noted to be on ACE inhibitor at home.  This might have contributed to hypotension May discontinue ACE inhibitor (Vasotec) for now.  In future, if her blood pressure increases, may be reconsidered Avoid NSAIDs if possible  2.  Lower extremity edema May be related to cirrhosis of liver Follow low-salt diet Lasix as needed  3.  Cirrhosis of liver Management as per hospitalist team  Patient is going to be out of town for 1 to 2 weeks.  Asked her to monitor her blood pressure at home.  We will be happy to see her in the office once she returns from her trip.         LOS: [place] 9/26/20198:34 AM  [place], [place]  Note: This note was prepared with Dragon dictation. Any transcription errors are unintentional

## 2018-01-10 NOTE — Progress Notes (Signed)
Patient refuses bed and chair alarms. Patient educated on purpose of alarms.

## 2018-01-10 NOTE — Progress Notes (Signed)
[facility] [place] NAME: Gwendolyn [name]    MR#:  [id number]  DATE OF BIRTH:  21-May-1945  SUBJECTIVE:  CHIEF COMPLAINT: Patient is patient is feeling much better denies any symptoms  REVIEW OF SYSTEMS:  CONSTITUTIONAL: No fever, fatigue or weakness.  EYES: No blurred or double vision.  EARS, NOSE, AND THROAT: No tinnitus or ear pain.  RESPIRATORY: No cough, shortness of breath, wheezing or hemoptysis.  CARDIOVASCULAR: No chest pain, orthopnea, edema.  GASTROINTESTINAL: No nausea, vomiting, diarrhea or abdominal pain.  GENITOURINARY: No dysuria, hematuria.  ENDOCRINE: No polyuria, nocturia,  HEMATOLOGY: No anemia, easy bruising or bleeding SKIN: No rash or lesion. MUSCULOSKELETAL: No joint pain or arthritis.   NEUROLOGIC: No tingling, numbness, weakness.  PSYCHIATRY: No anxiety or depression.   DRUG ALLERGIES:   Allergies  Allergen Reactions  . Codeine Nausea And Vomiting  . Oxycodone Itching  . Silver Other (See Comments)    (Tegaderm) Blisters.  Okay on hands.    VITALS:  Blood pressure 112/65, pulse (!) 56, temperature 98.2 F (36.8 C), temperature source Oral, resp. rate 20, height 5' 1"  (1.549 m), weight 107.1 kg, SpO2 94 %.  PHYSICAL EXAMINATION:  GENERAL:  72 y.o.-year-old patient lying in the bed with no acute distress.  EYES: Pupils equal, round, reactive to light and accommodation. No scleral icterus. Extraocular muscles intact.  HEENT: Head atraumatic, normocephalic. Oropharynx and nasopharynx clear.  NECK:  Supple, no jugular venous distention. No thyroid enlargement, no tenderness.  LUNGS: Normal breath sounds bilaterally, no wheezing, rales,rhonchi or crepitation. No use of accessory muscles of respiration.  CARDIOVASCULAR: S1, S2 normal. No murmurs, rubs, or gallops.  ABDOMEN: Soft, nontender, nondistended. Bowel sounds present. No organomegaly or mass.  EXTREMITIES: No pedal edema, cyanosis, or clubbing.   NEUROLOGIC: Cranial nerves II through XII are intact. Muscle strength 5/5 in all extremities. Sensation intact. Gait not checked.  PSYCHIATRIC: The patient is alert and oriented x 3.  SKIN: No obvious rash, lesion, or ulcer.    LABORATORY PANEL:   CBC Recent Labs  Lab 01/10/18 0311  WBC 3.3*  HGB 13.2  HCT 39.2  PLT 137*   ------------------------------------------------------------------------------------------------------------------  Chemistries  Recent Labs  Lab 01/10/18 0311  NA 145  K 4.7  CL 110  CO2 26  GLUCOSE 99  BUN 29*  CREATININE 1.65*  CALCIUM 9.0   ------------------------------------------------------------------------------------------------------------------  Cardiac Enzymes Recent Labs  Lab 01/09/18 1922  TROPONINI <0.03   ------------------------------------------------------------------------------------------------------------------  RADIOLOGY:  Dg Chest 2 View  Result Date: 01/09/2018 CLINICAL DATA:  Weakness with multiple falls today. EXAM: CHEST - 2 VIEW COMPARISON:  01/16/2013 FINDINGS: The heart size and mediastinal contours are within normal limits. Both lungs are clear. Dextroconvex curvature of the thoracic spine. IMPRESSION: No active cardiopulmonary disease. Dextroconvex curvature of the thoracic spine. Electronically Signed   By: Ashley Royalty M.D.   On: 01/09/2018 19:40   US Renal  Result Date: 01/10/2018 CLINICAL DATA:  Acute renal failure, history fibromyalgia, hypertension, GERD, NASH EXAM: RENAL / URINARY TRACT ULTRASOUND COMPLETE COMPARISON:  None FINDINGS: Right Kidney: Length: 10.1 cm. Cortical thinning. Upper normal cortical echogenicity. No mass, hydronephrosis or shadowing calcification. Left Kidney: Length: 10.7 cm. Cortical thinning. Normal cortical echogenicity. No mass, hydronephrosis or shadowing calcification. Bladder: Unremarkable IMPRESSION: BILATERAL renal cortical atrophy. Otherwise negative exam. Electronically  Signed   By: Lavonia Dana M.D.   On: 01/10/2018 12:27    EKG:   Orders placed or performed during  the hospital encounter of 01/09/18  . ED EKG within 10 minutes  . ED EKG within 10 minutes  . EKG    ASSESSMENT AND PLAN:    1.  Acute renal failure, likely prerenal, secondary to dehydration from diuretic use and poor p.o. fluid intake.   discontinue Lasix  Renal function is improving with gentle IV hydration. Creatinine in August 2015 was normal During this admission creatinine 2.13-1.65   Patient was also advised to avoid all NSAIDs, will discontinue meloxicam from her home medication list.   Urinalysis with nitrates negative and leuk trase but patient is  asymptomatic  kidneys ultrasound with bilateral renal cortical atrophy  Continue to monitor kidney function closely and avoid nephrotoxic medications.   Seen by nephrology Dr. Candiss Norse is following, recommending to discontinue Vasotec ACE inhibitor for now  2. NASH, stable we will continue treatment per gastroenterology at [facility].  3.  Anxiety/depression disorder, stable, continue home medications.  4.  Hypertension, stable, continue home medications.  5.  Fibromyalgia, stable, continue home medications.    All the records are reviewed and case discussed with Care Management/Social Workerr. Management plans discussed with the patient, family and they are in agreement.  CODE STATUS: fc   TOTAL TIME TAKING CARE OF THIS PATIENT: 32 minutes.   POSSIBLE D/C IN 1  DAYS, DEPENDING ON CLINICAL CONDITION.  Note: This dictation was prepared with Dragon dictation along with smaller phrase technology. Any transcriptional errors that result from this process are unintentional.   Nicholes Mango M.D on 01/10/2018 at 1:54 PM  Between 7am to 6pm - Pager - [phone] After 6pm go to [email or web address] - password [email or web address] [place] [facility]  Office  [phone]  CC: Primary care physician; Barbaraann Boys, MD

## 2018-01-10 NOTE — Plan of Care (Signed)

## 2018-01-11 LAB — BASIC METABOLIC PANEL
ANION GAP: 7 (ref 5–15)
BUN: 20 mg/dL (ref 8–23)
CHLORIDE: 110 mmol/L (ref 98–111)
CO2: 26 mmol/L (ref 22–32)
Calcium: 9.6 mg/dL (ref 8.9–10.3)
Creatinine, Ser: 0.9 mg/dL (ref 0.44–1.00)
GFR calc Af Amer: >60 mL/min (ref >60)
GLUCOSE: 95 mg/dL (ref 70–99)
POTASSIUM: 4.4 mmol/L (ref 3.5–5.1)
Sodium: 143 mmol/L (ref 135–145)

## 2018-01-11 LAB — HIV ANTIBODY (ROUTINE TESTING W REFLEX): HIV SCREEN 4TH GENERATION: NONREACTIVE

## 2018-01-11 LAB — GLUCOSE, CAPILLARY: GLUCOSE-CAPILLARY: 98 mg/dL (ref 70–99)

## 2018-01-11 MED ORDER — ACETAMINOPHEN 325 MG PO TABS: 650.0000 mg | ORAL_TABLET | Freq: Four times a day (QID) | ORAL | Status: DC | PRN

## 2018-01-11 MED ORDER — DOCUSATE SODIUM 100 MG PO CAPS: 100.0000 mg | ORAL_CAPSULE | Freq: Two times a day (BID) | ORAL | 0 refills | Status: DC | PRN

## 2018-01-11 NOTE — Discharge Summary (Addendum)
[facility] NAME: Gwendolyn [name]    MR#:  [id number]  DATE OF BIRTH:  Aug 20, 1945  DATE OF ADMISSION:  01/09/2018 ADMITTING PHYSICIAN: Gwendolyn Jo, MD  DATE OF DISCHARGE:  01/11/18   PRIMARY CARE PHYSICIAN: Gwendolyn Boys, MD    ADMISSION DIAGNOSIS:  Generalized weakness [R53.1] Fall, initial encounter [W19.XXXA] Acute kidney injury superimposed on chronic kidney disease ([place]) [N17.9, N18.9]  DISCHARGE DIAGNOSIS:  Active Problems:   ARF (acute renal failure) ([place])   SECONDARY DIAGNOSIS:   Past Medical History:  Diagnosis Date  . Anxiety   . Arthritis   . Breast cancer ([place]) 01/2010   left mastectomy, LN neg, on femara  . Chronic low back pain   . Depression   . Fibromyalgia   . GERD (gastroesophageal reflux disease)   . History of chicken pox   . HTN (hypertension)   . Hyperlipidemia   . Hypothyroidism   . IBS (irritable bowel syndrome)   . Migraines   . NASH (nonalcoholic steatohepatitis)    Followed by Dr. Gerald Dexter at [facility]  . RLS (restless legs syndrome)   . Trigeminal neuralgia     HOSPITAL COURSE:   HISTORY OF PRESENT ILLNESS: Gwendolyn [name]  is a 72 y.o. female with a known history of nonalcoholic steatohepatitis, hyper tension, fibromyalgia, anxiety and depression disorder, irritable bowel syndrome, remote breast cancer status post left mastectomy and other comorbidities. Patient presented to emergency room for generalized weakness, drowsiness and dizziness going on for the past 24 hours, gradually getting worse.  Patient reports she feels very thirsty.  She denies any chest pain, shortness of breath, palpitations, abdominal pain, vomiting or diarrhea, no bleeding.  She did fall twice on her bottom, earlier today, at home, due to generalized weakness; there was no head trauma or LOC.  Patient denies having similar episodes in the past. She denies any new medications, but she has been taking Lasix as needed  for lower extremities swelling.  She currently does not have any lower extremities swelling. Blood test done emergency room are remarkable for elevated creatinine level at 2.13 and elevated BUN level at 31.  The reminder of CBC and BMP are unremarkable. Chest x-ray is negative for any acute cardiopulmonary disease. Patient is admitted for further evaluation and treatment.  .Acute renal failure, likely prerenal, secondary to dehydration from diuretic use and poor p.o. fluid intake.   discontinue Lasix for now  Renal function is improved  with gentle IV hydration and avoiding nephrotoxins Creatinine in August 2015 was normal During this admission creatinine 2.13-1.80- nml now Patient was also advised to avoid all NSAIDs,will discontinue meloxicam from her home medication list.  Urinalysis with nitrates negative and leuk trase but patient is  asymptomatic  kidneys ultrasound with bilateral renal cortical atrophy Continue to monitor kidney function closely and avoid nephrotoxic medications. Seen by nephrology Dr. Candiss Norse is following, recommending to discontinue Vasotec ACE inhibitor for now Op f/u with der.singh in 2 weeks   2. NASH,stable we will continue treatment per gastroenterology at [facility]  3.Anxiety/depression disorder, stable, continue home medications.  4.Hypertension, stable, continue home medications.  5.Fibromyalgia, stable, continue home medications. DISCHARGE CONDITIONS:   stable  CONSULTS OBTAINED:  Treatment Team:  Murlean Iba, MD   PROCEDURES none   DRUG ALLERGIES:   Allergies  Allergen Reactions  . Codeine Nausea And Vomiting  . Oxycodone Itching  . Silver Other (See Comments)    (Tegaderm) Blisters.  Okay on  hands.    DISCHARGE MEDICATIONS:   Allergies as of 01/11/2018      Reactions   Codeine Nausea And Vomiting   Oxycodone Itching   Silver Other (See Comments)   (Tegaderm) Blisters.  Okay on hands.      Medication  List    STOP taking these medications   enalapril 10 MG tablet Commonly known as:  VASOTEC   furosemide 20 MG tablet Commonly known as:  LASIX   meclizine 32 MG tablet Commonly known as:  ANTIVERT   meloxicam 15 MG tablet Commonly known as:  MOBIC   potassium chloride 10 MEQ CR capsule Commonly known as:  MICRO-K     TAKE these medications   acetaminophen 325 MG tablet Commonly known as:  TYLENOL Take 2 tablets (650 mg total) by mouth every 6 (six) hours as needed for mild pain (or Fever >/= 101).   albuterol 108 (90 Base) MCG/ACT inhaler Commonly known as:  PROVENTIL HFA;VENTOLIN HFA Inhale 2 puffs into the lungs every 6 (six) hours as needed for wheezing or shortness of breath.   ALPRAZolam 0.25 MG tablet Commonly known as:  XANAX Take 1-2 tablets (0.25-0.5 mg total) by mouth at bedtime as needed for anxiety.   atorvastatin 40 MG tablet Commonly known as:  LIPITOR Take 40 mg by mouth at bedtime.   Azelastine HCl 0.15 % Soln Place 1-2 sprays into the nose 2 (two) times daily.   Baclofen 5 MG Tabs Take 5-10 mg by mouth 3 (three) times daily. One in the morning and lunch and two at night   BUTRANS 15 MCG/HR Ptwk Generic drug:  Buprenorphine Place 15 mcg onto the skin once a week. Applies on Sunday   calcium carbonate 500 MG chewable tablet Commonly known as:  TUMS - dosed in mg elemental calcium Chew 1 tablet by mouth as needed for indigestion or heartburn.   cholecalciferol 1000 units tablet Commonly known as:  VITAMIN D Take 1,000 Units by mouth daily.   docusate sodium 100 MG capsule Commonly known as:  COLACE Take 1 capsule (100 mg total) by mouth 2 (two) times daily as needed for mild constipation.   DULoxetine 60 MG capsule Commonly known as:  CYMBALTA Take 1 capsule (60 mg total) by mouth daily.   escitalopram 20 MG tablet Commonly known as:  LEXAPRO Take 20 mg by mouth daily.   fluticasone 50 MCG/ACT nasal spray Commonly known as:   FLONASE Place 1 spray into both nostrils 2 (two) times daily.   gabapentin 300 MG capsule Commonly known as:  NEURONTIN Take 300 mg by mouth 2 (two) times daily.   ibandronate 150 MG tablet Commonly known as:  BONIVA Take 150 mg by mouth every 30 (thirty) days. Takes on the 1st of the month   lamoTRIgine 25 MG tablet Commonly known as:  LAMICTAL Take 25 mg by mouth 2 (two) times daily.   levothyroxine 50 MCG tablet Commonly known as:  SYNTHROID, LEVOTHROID Take 1 tablet (50 mcg total) by mouth daily.   metoprolol succinate 25 MG 24 hr tablet Commonly known as:  TOPROL-XL Take 1 tablet (25 mg total) by mouth daily.   multivitamin with minerals tablet Take 1 tablet by mouth daily.   oxybutynin 5 MG 24 hr tablet Commonly known as:  DITROPAN-XL Take 5 mg by mouth daily.   pantoprazole 40 MG tablet Commonly known as:  PROTONIX Take 1 tablet (40 mg total) by mouth daily.   rOPINIRole 2 MG tablet Commonly known  as:  REQUIP Take 1 tablet (2 mg total) by mouth 2 (two) times daily. What changed:  when to take this   temazepam 15 MG capsule Commonly known as:  RESTORIL Take 1 capsule (15 mg total) by mouth at bedtime as needed for sleep.   vitamin E 1000 UNIT capsule Take 1,000 Units by mouth daily.        DISCHARGE INSTRUCTIONS:  Follow-up with primary care physician in 3 to 5 days or sooner as needed Follow-up with nephrology Dr. Candiss Norse in a week   DIET:  Cardiac diet  DISCHARGE CONDITION:  Stable  ACTIVITY:  Activity as tolerated  OXYGEN:  Home Oxygen: No.   Oxygen Delivery: room air  DISCHARGE LOCATION:  home   If you experience worsening of your admission symptoms, develop shortness of breath, life threatening emergency, suicidal or homicidal thoughts you must seek medical attention immediately by calling 911 or calling your MD immediately  if symptoms less severe.  You Must read complete instructions/literature along with all the possible adverse  reactions/side effects for all the Medicines you take and that have been prescribed to you. Take any new Medicines after you have completely understood and accpet all the possible adverse reactions/side effects.   Please note  You were cared for by a hospitalist during your hospital stay. If you have any questions about your discharge medications or the care you received while you were in the hospital after you are discharged, you can call the unit and asked to speak with the hospitalist on call if the hospitalist that took care of you is not available. Once you are discharged, your primary care physician will handle any further medical issues. Please note that NO REFILLS for any discharge medications will be authorized once you are discharged, as it is imperative that you return to your primary care physician (or establish a relationship with a primary care physician if you do not have one) for your aftercare needs so that they can reassess your need for medications and monitor your lab values.     Today  Chief Complaint  Patient presents with  . Fall   Patient is feeling good back to her baseline.  Denies any complaints dressed up to go home  ROS:  CONSTITUTIONAL: Denies fevers, chills. Denies any fatigue, weakness.  EYES: Denies blurry vision, double vision, eye pain. EARS, NOSE, THROAT: Denies tinnitus, ear pain, hearing loss. RESPIRATORY: Denies cough, wheeze, shortness of breath.  CARDIOVASCULAR: Denies chest pain, palpitations, edema.  GASTROINTESTINAL: Denies nausea, vomiting, diarrhea, abdominal pain. Denies bright red blood per rectum. GENITOURINARY: Denies dysuria, hematuria. ENDOCRINE: Denies nocturia or thyroid problems. HEMATOLOGIC AND LYMPHATIC: Denies easy bruising or bleeding. SKIN: Denies rash or lesion. MUSCULOSKELETAL: Denies pain in neck, back, shoulder, knees, hips or arthritic symptoms.  NEUROLOGIC: Denies paralysis, paresthesias.  PSYCHIATRIC: Denies anxiety or  depressive symptoms.   VITAL SIGNS:  Blood pressure 130/70, pulse (!) 56, temperature 97.9 F (36.6 C), temperature source Oral, resp. rate 17, height 5' 1"  (1.549 m), weight 105.2 kg, SpO2 95 %.  I/O:    Intake/Output Summary (Last 24 hours) at 01/11/2018 0911 Last data filed at 01/10/2018 1839 Gross per 24 hour  Intake 1279.81 ml  Output -  Net 1279.81 ml    PHYSICAL EXAMINATION:  GENERAL:  72 y.o.-year-old patient lying in the bed with no acute distress.  EYES: Pupils equal, round, reactive to light and accommodation. No scleral icterus. Extraocular muscles intact.  HEENT: Head atraumatic, normocephalic. Oropharynx and nasopharynx  clear.  NECK:  Supple, no jugular venous distention. No thyroid enlargement, no tenderness.  LUNGS: Normal breath sounds bilaterally, no wheezing, rales,rhonchi or crepitation. No use of accessory muscles of respiration.  CARDIOVASCULAR: S1, S2 normal. No murmurs, rubs, or gallops.  ABDOMEN: Soft, non-tender, non-distended. Bowel sounds present. No organomegaly or mass.  EXTREMITIES: No pedal edema, cyanosis, or clubbing.  NEUROLOGIC: Cranial nerves II through XII are intact. Muscle strength 5/5 in all extremities. Sensation intact. Gait not checked.  PSYCHIATRIC: The patient is alert and oriented x 3.  SKIN: No obvious rash, lesion, or ulcer.   DATA REVIEW:   CBC Recent Labs  Lab 01/10/18 0311  WBC 3.3*  HGB 13.2  HCT 39.2  PLT 137*    Chemistries  Recent Labs  Lab 01/11/18 0306  NA 143  K 4.4  CL 110  CO2 26  GLUCOSE 95  BUN 20  CREATININE 0.90  CALCIUM 9.6    Cardiac Enzymes Recent Labs  Lab 01/09/18 1922  TROPONINI <0.03    Microbiology Results  Results for orders placed or performed in visit on 11/12/12  Urine culture     Status: None   Collection Time: 11/12/12  2:52 PM  Result Value Ref Range Status   Colony Count NO GROWTH  Final   Organism ID, Bacteria NO GROWTH  Final    RADIOLOGY:  Dg Chest 2  View  Result Date: 01/09/2018 CLINICAL DATA:  Weakness with multiple falls today. EXAM: CHEST - 2 VIEW COMPARISON:  01/16/2013 FINDINGS: The heart size and mediastinal contours are within normal limits. Both lungs are clear. Dextroconvex curvature of the thoracic spine. IMPRESSION: No active cardiopulmonary disease. Dextroconvex curvature of the thoracic spine. Electronically Signed   By: Ashley Royalty M.D.   On: 01/09/2018 19:40   US Renal  Result Date: 01/10/2018 CLINICAL DATA:  Acute renal failure, history fibromyalgia, hypertension, GERD, NASH EXAM: RENAL / URINARY TRACT ULTRASOUND COMPLETE COMPARISON:  None FINDINGS: Right Kidney: Length: 10.1 cm. Cortical thinning. Upper normal cortical echogenicity. No mass, hydronephrosis or shadowing calcification. Left Kidney: Length: 10.7 cm. Cortical thinning. Normal cortical echogenicity. No mass, hydronephrosis or shadowing calcification. Bladder: Unremarkable IMPRESSION: BILATERAL renal cortical atrophy. Otherwise negative exam. Electronically Signed   By: Lavonia Dana M.D.   On: 01/10/2018 12:27    EKG:   Orders placed or performed during the hospital encounter of 01/09/18  . ED EKG within 10 minutes  . ED EKG within 10 minutes  . EKG      Management plans discussed with the patient, family and they are in agreement.  CODE STATUS:     Code Status Orders  (From admission, onward)         Start     Ordered   01/10/18 0144  Full code  Continuous     01/10/18 0143        Code Status History    This patient has a current code status but no historical code status.    Advance Directive Documentation     Most Recent Value  Type of Advance Directive  Healthcare Power of Attorney, Living will  Pre-existing out of facility DNR order (yellow form or pink MOST form)  -  "MOST" Form in Place?  -      TOTAL TIME TAKING CARE OF THIS PATIENT: 43 minutes.   Note: This dictation was prepared with Dragon dictation along with smaller phrase  technology. Any transcriptional errors that result from this process are unintentional.   @  M[facility]@  on 01/11/2018 at 9:11 AM  Between 7am to 6pm - Pager - [phone]  After 6pm go to [email or web address] - password [email or web address] [facility]  Office  [phone]  CC: Primary care physician; Gwendolyn Boys, MD

## 2018-01-11 NOTE — Discharge Instructions (Signed)
Follow-up with primary care physician in 3 to 5 days or sooner as needed Follow-up with nephrology Dr. Candiss Norse in a week

## 2018-01-11 NOTE — Progress Notes (Signed)
Patient discharged home per MD order. All discharge instructions given and all questions answered. 

## 2018-01-11 NOTE — Progress Notes (Signed)
Patient refuses bed and chair alarms. Patient educated on purpose of alarms.

## 2018-01-11 NOTE — Progress Notes (Signed)
Date: 01/11/2018                  Patient Name:  Gwendolyn [name]  MRN: [id number]  DOB: 12-21-1945  Age / Sex: 72 y.o., female         PCP: Barbaraann Boys, MD/ [facility]                 Service Requesting Consult: IM/ Nicholes Mango, MD                 Reason for Consult: ARF            Subjective: Patient feels well today.  Sitting up in the chair ready for discharge to home.  No shortness of breath.  Able to eat without nausea or vomiting.  Medications: Outpatient medications: Medications Prior to Admission  Medication Sig Dispense Refill Last Dose  . albuterol (PROVENTIL HFA;VENTOLIN HFA) 108 (90 Base) MCG/ACT inhaler Inhale 2 puffs into the lungs every 6 (six) hours as needed for wheezing or shortness of breath.   prn at prn  . atorvastatin (LIPITOR) 40 MG tablet Take 40 mg by mouth at bedtime.    01/08/2018 at Unknown time  . Azelastine HCl 0.15 % SOLN Place 1-2 sprays into the nose 2 (two) times daily.    01/09/2018 at Unknown time  . Baclofen 5 MG TABS Take 5-10 mg by mouth 3 (three) times daily. One in the morning and lunch and two at night   01/09/2018 at Unknown time  . Buprenorphine (BUTRANS) 15 MCG/HR PTWK Place 15 mcg onto the skin once a week. Applies on Sunday   01/06/2018 at Unknown time  . calcium carbonate (TUMS - DOSED IN MG ELEMENTAL CALCIUM) 500 MG chewable tablet Chew 1 tablet by mouth as needed for indigestion or heartburn.   prn at prn  . cholecalciferol (VITAMIN D) 1000 UNITS tablet Take 1,000 Units by mouth daily.   01/09/2018 at Unknown time  . enalapril (VASOTEC) 10 MG tablet Take 1 tablet (10 mg total) by mouth daily. 90 tablet 4 01/09/2018 at Unknown time  . escitalopram (LEXAPRO) 20 MG tablet Take 20 mg by mouth daily.    01/09/2018 at Unknown time  . fluticasone (FLONASE) 50 MCG/ACT nasal spray Place 1 spray into both nostrils 2 (two) times daily.    01/09/2018 at Unknown time  . furosemide (LASIX) 20 MG tablet Take 20-40 mg by mouth daily as  needed for edema. Normally takes 63m but will take an additional 284mif having swelling   prn at prn  . gabapentin (NEURONTIN) 300 MG capsule Take 300 mg by mouth 2 (two) times daily.    01/09/2018 at Unknown time  . ibandronate (BONIVA) 150 MG tablet Take 150 mg by mouth every 30 (thirty) days. Takes on the 1st of the month   Past Month at Unknown time  . lamoTRIgine (LAMICTAL) 25 MG tablet Take 25 mg by mouth 2 (two) times daily.    01/09/2018 at Unknown time  . levothyroxine (SYNTHROID, LEVOTHROID) 50 MCG tablet Take 1 tablet (50 mcg total) by mouth daily. 90 tablet 4 01/09/2018 at Unknown time  . meloxicam (MOBIC) 15 MG tablet Take 15 mg by mouth daily.   01/09/2018 at Unknown time  . metoprolol succinate (TOPROL-XL) 25 MG 24 hr tablet Take 1 tablet (25 mg total) by mouth daily. 90 tablet 3 01/09/2018 at 0930  . Multiple Vitamins-Minerals (MULTIVITAMIN WITH MINERALS) tablet Take 1 tablet by mouth daily.  01/09/2018 at Unknown time  . oxybutynin (DITROPAN-XL) 5 MG 24 hr tablet Take 5 mg by mouth daily.   01/09/2018 at Unknown time  . pantoprazole (PROTONIX) 40 MG tablet Take 1 tablet (40 mg total) by mouth daily. 90 tablet 3 01/09/2018 at Unknown time  . potassium chloride (MICRO-K) 10 MEQ CR capsule Take 10 mEq by mouth 2 (two) times daily.   01/09/2018 at Unknown time  . rOPINIRole (REQUIP) 2 MG tablet Take 1 tablet (2 mg total) by mouth 2 (two) times daily. (Patient taking differently: Take 2 mg by mouth 3 (three) times daily. ) 180 tablet 2 01/09/2018 at Unknown time  . vitamin E (VITAMIN E) 1000 UNIT capsule Take 1,000 Units by mouth daily.   01/09/2018 at Unknown time  . ALPRAZolam (XANAX) 0.25 MG tablet Take 1-2 tablets (0.25-0.5 mg total) by mouth at bedtime as needed for anxiety. (Patient not taking: Reported on 11/26/2017) 60 tablet 4 Not Taking at Unknown time  . DULoxetine (CYMBALTA) 60 MG capsule Take 1 capsule (60 mg total) by mouth daily. (Patient not taking: Reported on 11/26/2017) 90 capsule  0 Not Taking at Unknown time  . meclizine (ANTIVERT) 32 MG tablet Take 1 tablet (32 mg total) by mouth 3 (three) times daily as needed. (Patient not taking: Reported on 11/26/2017) 30 tablet 1 Not Taking at Unknown time  . temazepam (RESTORIL) 15 MG capsule Take 1 capsule (15 mg total) by mouth at bedtime as needed for sleep. (Patient not taking: Reported on 11/26/2017) 30 capsule 3 Not Taking at Unknown time    Current medications: Current Facility-Administered Medications  Medication Dose Route Frequency Provider Last Rate Last Dose  . acetaminophen (TYLENOL) tablet 650 mg  650 mg Oral Q6H PRN Amelia Jo, MD       Or  . acetaminophen (TYLENOL) suppository 650 mg  650 mg Rectal Q6H PRN Amelia Jo, MD      . albuterol (PROVENTIL) (2.5 MG/3ML) 0.083% nebulizer solution 2.5 mg  2.5 mg Inhalation Q6H PRN Amelia Jo, MD      . atorvastatin (LIPITOR) tablet 40 mg  40 mg Oral QHS Amelia Jo, MD   40 mg at 01/10/18 2342  . azelastine (ASTELIN) 0.1 % nasal spray 1-2 spray  1-2 spray Each Nare BID Amelia Jo, MD      . bisacodyl (DULCOLAX) EC tablet 5 mg  5 mg Oral Daily PRN Amelia Jo, MD      . Derrill Memo ON 01/13/2018] Buprenorphine PTWK 15 mcg  15 mcg Transdermal Weekly Amelia Jo, MD      . calcium carbonate (TUMS - dosed in mg elemental calcium) chewable tablet 200 mg of elemental calcium  1 tablet Oral PRN Amelia Jo, MD      . cholecalciferol (VITAMIN D) tablet 1,000 Units  1,000 Units Oral Daily Amelia Jo, MD   1,000 Units at 01/11/18 [phone]  . docusate sodium (COLACE) capsule 100 mg  100 mg Oral BID Amelia Jo, MD   100 mg at 01/11/18 4098  . escitalopram (LEXAPRO) tablet 20 mg  20 mg Oral Daily Amelia Jo, MD   20 mg at 01/11/18 0823  . fluticasone (FLONASE) 50 MCG/ACT nasal spray 1 spray  1 spray Each Nare BID Amelia Jo, MD   1 spray at 01/11/18 [phone]  . gabapentin (NEURONTIN) capsule 300 mg  300 mg Oral BID Amelia Jo, MD   300 mg at 01/11/18 0823  . heparin  injection 5,000 Units  5,000 Units Subcutaneous Q8H Amelia Jo,  MD   5,000 Units at 01/10/18 1417  . lamoTRIgine (LAMICTAL) tablet 25 mg  25 mg Oral BID Amelia Jo, MD   25 mg at 01/11/18 9563  . levothyroxine (SYNTHROID, LEVOTHROID) tablet 50 mcg  50 mcg Oral QAC breakfast Amelia Jo, MD   50 mcg at 01/11/18 [phone]  . metoprolol succinate (TOPROL-XL) 24 hr tablet 25 mg  25 mg Oral Daily Amelia Jo, MD   25 mg at 01/11/18 0823  . multivitamin with minerals tablet 1 tablet  1 tablet Oral Daily Amelia Jo, MD   1 tablet at 01/11/18 [phone]  . ondansetron (ZOFRAN) tablet 4 mg  4 mg Oral Q6H PRN Amelia Jo, MD       Or  . ondansetron [facility]) injection 4 mg  4 mg Intravenous Q6H PRN Amelia Jo, MD      . pantoprazole (PROTONIX) EC tablet 40 mg  40 mg Oral Daily Amelia Jo, MD   40 mg at 01/11/18 0823  . potassium chloride (K-DUR,KLOR-CON) CR tablet 10 mEq  10 mEq Oral Daily Amelia Jo, MD   10 mEq at 01/11/18 0823  . rOPINIRole (REQUIP) tablet 2 mg  2 mg Oral TID Amelia Jo, MD   2 mg at 01/11/18 9518  . traZODone (DESYREL) tablet 25 mg  25 mg Oral QHS PRN Amelia Jo, MD      . vitamin E capsule 1,000 Units  1,000 Units Oral Daily Amelia Jo, MD   1,000 Units at 01/11/18 8416      Allergies: Allergies  Allergen Reactions  . Codeine Nausea And Vomiting  . Oxycodone Itching  . Silver Other (See Comments)    (Tegaderm) Blisters.  Okay on hands.      Past Medical History: Past Medical History:  Diagnosis Date  . Anxiety   . Arthritis   . Breast cancer ([place]) 01/2010   left mastectomy, LN neg, on femara  . Chronic low back pain   . Depression   . Fibromyalgia   . GERD (gastroesophageal reflux disease)   . History of chicken pox   . HTN (hypertension)   . Hyperlipidemia   . Hypothyroidism   . IBS (irritable bowel syndrome)   . Migraines   . NASH (nonalcoholic steatohepatitis)    Follow[facility] by Dr. Gerald Dexter at [facility]  . RLS (restless legs syndrome)   .  Trigeminal neuralgia      Past Surgical History: Past Surgical History:  Procedure Laterality Date  . ANKLE SURGERY  2006  . BREAST BIOPSY  [place]   [place]  . CHOLECYSTECTOMY  2005  . CYSTOCELE REPAIR    . GASTRIC BYPASS  2008  . MASTECTOMY  2011   Breast Cancer  . RECTOCELE REPAIR    . ROTATOR CUFF REPAIR  2001  . TONSILLECTOMY  age 44  . TOTAL ABDOMINAL HYSTERECTOMY W/ BILATERAL SALPINGOOPHORECTOMY  1991  . VAGINAL DELIVERY     x3     Family History: Family History  Problem Relation Age of Onset  . COPD Mother   . Alzheimer's disease Father   . Cancer Sister 66       Breast  . Arthritis Maternal Grandmother   . Arthritis Maternal Grandfather      Social History: Social History   Socioeconomic History  . Marital status: Married    Spouse name: Not on file  . Number of children: 2  . Years of education: Not on file  . Highest education level: Not on file  Occupational History  .  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 Financial resource strain: Not on file  . Food insecurity:    Worry: Not on file    Inability: Not on file  . Transportation needs:    Medical: Not on file    Non-medical: Not on file  Tobacco Use  . Smoking status: Never Smoker  . Smokeless tobacco: Never Used  Substance and Sexual Activity  . Alcohol use: Yes    Comment: Occasional  . Drug use: No  . Sexual activity: Not on file  Lifestyle  . Physical activity:    Days per week: Not on file    Minutes per session: Not on file  . Stress: Not on file  Relationships  . Social connections:    Talks on phone: Not on file    Gets together: Not on file    Attends religious service: Not on file    Active member of club or organization: Not on file    Attends meetings of clubs or organizations: Not on file    Relationship status: Not on file  . Intimate partner violence:    Fear of current or ex partner: Not on file    Emotionally abused: Not on file     Physically abused: Not on file    Forced sexual activity: Not on file  Other Topics Concern  . Not on file  Social History Narrative   Lives with husband who has MS.      Regular Exercise -  Yes, water aerobics 3 times a week   Daily Caffeine Use:  NO              Vital Signs: Blood pressure 130/70, pulse (!) 56, temperature 97.9 F (36.6 C), temperature source Oral, resp. rate 17, height 5' 1"  (1.549 m), weight 105.2 kg, SpO2 95 %.   Intake/Output Summary (Last 24 hours) at 01/11/2018 0925 Last data filed at 01/10/2018 1839 Gross per 24 hour  Intake 1279.81 ml  Output -  Net 1279.81 ml    Weight trends: Filed Weights   01/09/18 1911 01/10/18 0048 01/11/18 0412  Weight: 106.1 kg 107.1 kg 105.2 kg    Physical Exam: General:  No acute distress, laying in the bed  HEENT  anicteric, moist oral mucous membranes  Neck:  Supple, no masses  Lungs:  Mild basilar crackles, normal breathing effort, room air  Heart::  Regular rhythm  Abdomen:  Soft, nontender  Extremities:  1+ pitting edema bilaterally  Neurologic:  Alert, oriented  Skin:  No acute rashes             Lab results: Basic Metabolic Panel: Recent Labs  Lab 01/09/18 1922 01/10/18 0311 01/11/18 0306  NA 141 145 143  K 4.6 4.7 4.4  CL 107 110 110  CO2 22 26 26   GLUCOSE 90 99 95  BUN 31* 29* 20  CREATININE 2.13* 1.65* 0.90  CALCIUM 9.3 9.0 9.6    Liver Function Tests: No results for input(s): AST, ALT, ALKPHOS, BILITOT, PROT, ALBUMIN in the last 168 hours. No results for input(s): LIPASE, AMYLASE in the last 168 hours. No results for input(s): AMMONIA in the last 168 hours.  CBC: Recent Labs  Lab 01/09/18 1922 01/10/18 0311  WBC 4.1 3.3*  HGB 14.5 13.2  HCT 43.0 39.2  MCV 97.4 96.6  PLT 164 137*    Cardiac Enzymes: Recent Labs  Lab 01/09/18 1922  TROPONINI <0.03  BNP: Invalid input(s): POCBNP  CBG: Recent Labs  Lab 01/10/18 0743 01/10/18 0901 01/11/18 0820  GLUCAP 68* 99  98    Microbiology: No results found for this or any previous visit (from the past 720 hour(s)).   Coagulation Studies: No results for input(s): LABPROT, INR in the last 72 hours.  Urinalysis: Recent Labs    01/10/18 1134  COLORURINE STRAW*  LABSPEC 1.005  PHURINE 5.0  GLUCOSEU NEGATIVE  HGBUR NEGATIVE  BILIRUBINUR NEGATIVE  KETONESUR NEGATIVE  PROTEINUR NEGATIVE  NITRITE NEGATIVE  LEUKOCYTESUR TRACE*        Imaging: Dg Chest 2 View  Result Date: 01/09/2018 CLINICAL DATA:  Weakness with multiple falls today. EXAM: CHEST - 2 VIEW COMPARISON:  01/16/2013 FINDINGS: The heart size and mediastinal contours are within normal limits. Both lungs are clear. Dextroconvex curvature of the thoracic spine. IMPRESSION: No active cardiopulmonary disease. Dextroconvex curvature of the thoracic spine. Electronically Signed   By: Ashley Royalty M.D.   On: 01/09/2018 19:40   US Renal  Result Date: 01/10/2018 CLINICAL DATA:  Acute renal failure, history fibromyalgia, hypertension, GERD, NASH EXAM: RENAL / URINARY TRACT ULTRASOUND COMPLETE COMPARISON:  None FINDINGS: Right Kidney: Length: 10.1 cm. Cortical thinning. Upper normal cortical echogenicity. No mass, hydronephrosis or shadowing calcification. Left Kidney: Length: 10.7 cm. Cortical thinning. Normal cortical echogenicity. No mass, hydronephrosis or shadowing calcification. Bladder: Unremarkable IMPRESSION: BILATERAL renal cortical atrophy. Otherwise negative exam. Electronically Signed   By: Lavonia Dana M.D.   On: 01/10/2018 12:27     Assessment & Plan: Pt is a 72 y.o. Caucasian  female with medical problems of nonalcoholic cirrhosis, hypertension, fibromyalgia, anxiety and depression, irritable bowel syndrome, remote history of breast cancer left mastectomy, who was [facility] to [facility] on 01/09/2018 for evaluation of falling twice at home.   1.  Acute renal failure Multifactorial.  Likely from hypotension and possibly volume depletion.  Serum  creatinine has improved back to normal with hemodynamic stability and IV hydration. May discontinue ACE inhibitor (Vasotec) for now.  In future, if her blood pressure increases, may be reconsidered Avoid NSAIDs if possible  2.  Lower extremity edema May be related to cirrhosis of liver Follow low-salt diet Lasix as needed  3.  Cirrhosis of liver Management as per hospitalist team  F/u with nephrology as needed.      LOS: 2 Latriece Anstine Candiss Norse 9/27/20199:25 AM  [place], [place]  Note: This note was prepared with Dragon dictation. Any transcription errors are unintentional

## 2018-02-28 ENCOUNTER — Ambulatory Visit (INDEPENDENT_AMBULATORY_CARE_PROVIDER_SITE_OTHER): Payer: Medicare Other | Admitting: Vascular Surgery

## 2018-02-28 ENCOUNTER — Encounter (INDEPENDENT_AMBULATORY_CARE_PROVIDER_SITE_OTHER): Payer: Medicare Other

## 2018-05-13 ENCOUNTER — Other Ambulatory Visit (HOSPITAL_COMMUNITY): Payer: Self-pay | Admitting: Orthopedic Surgery

## 2018-05-13 ENCOUNTER — Other Ambulatory Visit: Payer: Self-pay | Admitting: Orthopedic Surgery

## 2018-05-13 DIAGNOSIS — M1712 Unilateral primary osteoarthritis, left knee: Secondary | ICD-10-CM

## 2018-05-13 DIAGNOSIS — M25562 Pain in left knee: Secondary | ICD-10-CM

## 2018-05-13 DIAGNOSIS — M25362 Other instability, left knee: Secondary | ICD-10-CM

## 2018-05-13 DIAGNOSIS — G8929 Other chronic pain: Secondary | ICD-10-CM

## 2018-05-21 ENCOUNTER — Ambulatory Visit
Admission: RE | Admit: 2018-05-21 | Discharge: 2018-05-21 | Disposition: A | Discharge status: Home / Self Care | Payer: Medicare Other | Source: Ambulatory Visit | Attending: Orthopedic Surgery | Admitting: Orthopedic Surgery

## 2018-05-21 DIAGNOSIS — M25562 Pain in left knee: Secondary | ICD-10-CM | POA: Insufficient documentation

## 2018-05-21 DIAGNOSIS — M25362 Other instability, left knee: Secondary | ICD-10-CM

## 2018-05-21 DIAGNOSIS — M1712 Unilateral primary osteoarthritis, left knee: Secondary | ICD-10-CM | POA: Diagnosis present

## 2018-05-21 DIAGNOSIS — G8929 Other chronic pain: Secondary | ICD-10-CM

## 2018-07-09 ENCOUNTER — Inpatient Hospital Stay: Admission: RE | Admit: 2018-07-09 | Payer: Self-pay | Source: Ambulatory Visit

## 2018-07-16 ENCOUNTER — Ambulatory Visit: Admit: 2018-07-16 | Payer: Medicare Other | Admitting: Surgery

## 2018-07-16 SURGERY — ARTHROSCOPY, KNEE, WITH MEDIAL MENISCECTOMY
Anesthesia: Choice | Laterality: Left

## 2018-09-12 ENCOUNTER — Other Ambulatory Visit
Admission: RE | Admit: 2018-09-12 | Discharge: 2018-09-12 | Disposition: A | Discharge status: Home / Self Care | Payer: Medicare Other | Source: Ambulatory Visit | Attending: Surgery | Admitting: Surgery

## 2018-09-12 ENCOUNTER — Other Ambulatory Visit: Payer: Self-pay

## 2018-09-12 ENCOUNTER — Encounter
Admission: RE | Admit: 2018-09-12 | Discharge: 2018-09-12 | Disposition: A | Discharge status: Home / Self Care | Payer: Medicare Other | Source: Ambulatory Visit | Attending: Surgery | Admitting: Surgery

## 2018-09-12 DIAGNOSIS — Z01818 Encounter for other preprocedural examination: Secondary | ICD-10-CM | POA: Insufficient documentation

## 2018-09-12 DIAGNOSIS — R001 Bradycardia, unspecified: Secondary | ICD-10-CM | POA: Insufficient documentation

## 2018-09-12 DIAGNOSIS — R9431 Abnormal electrocardiogram [ECG] [EKG]: Secondary | ICD-10-CM | POA: Insufficient documentation

## 2018-09-12 DIAGNOSIS — Z1159 Encounter for screening for other viral diseases: Secondary | ICD-10-CM | POA: Diagnosis not present

## 2018-09-12 DIAGNOSIS — I10 Essential (primary) hypertension: Secondary | ICD-10-CM | POA: Insufficient documentation

## 2018-09-12 HISTORY — DX: Other seasonal allergic rhinitis: J30.2

## 2018-09-12 HISTORY — DX: Unspecified cirrhosis of liver: K74.60

## 2018-09-12 HISTORY — DX: Nausea with vomiting, unspecified: R11.2

## 2018-09-12 HISTORY — DX: Unspecified asthma, uncomplicated: J45.909

## 2018-09-12 HISTORY — DX: Other specified postprocedural states: Z98.890

## 2018-09-12 NOTE — Patient Instructions (Signed)
Your procedure is scheduled on: 09/17/2018 Tues Report to Same Day Surgery 2nd floor medical mall [facility] Entrance-take elevator on left to 2nd floor.  Check in with surgery information desk.) To find out your arrival time please call [phone] between 1PM - 3PM on 09/16/2018 Mon  Remember: Instructions that are not followed completely may result in serious medical risk, up to and including death, or upon the discretion of your surgeon and anesthesiologist your surgery may need to be rescheduled.    _x___ 1. Do not eat food after midnight the night before your procedure. You may drink clear liquids up to 2 hours before you are scheduled to arrive at the hospital for your procedure.  Do not drink clear liquids within 2 hours of your scheduled arrival to the hospital.  Clear liquids include  --Water or Apple juice without pulp  --Clear carbohydrate beverage such as ClearFast or Gatorade  --Black Coffee or Clear Tea (No milk, no creamers, do not add anything to                  the coffee or Tea Type 1 and type 2 diabetics should only drink water.   ____Ensure clear carbohydrate drink on the way to the hospital for bariatric patients  ____Ensure clear carbohydrate drink 3 hours before surgery for Dr Dwyane Luo patients if physician instructed.   No gum chewing or hard candies.     __x__ 2. No Alcohol for 24 hours before or after surgery.   __x__3. No Smoking or e-cigarettes for 24 prior to surgery.  Do not use any chewable tobacco products for at least 6 hour prior to surgery   ____  4. Bring all medications with you on the day of surgery if instructed.    __x__ 5. Notify your doctor if there is any change in your medical condition     (cold, fever, infections).    x___6. On the morning of surgery brush your teeth with toothpaste and water.  You may rinse your mouth with mouth wash if you wish.  Do not swallow any toothpaste or mouthwash.   Do not wear jewelry, make-up, hairpins,  clips or nail polish.  Do not wear lotions, powders, or perfumes. You may wear deodorant.  Do not shave 48 hours prior to surgery. Men may shave face and neck.  Do not bring valuables to the hospital.    [facility] is not responsible for any belongings or valuables.               Contacts, dentures or bridgework may not be worn into surgery.  Leave your suitcase in the car. After surgery it may be brought to your room.  For patients admitted to the hospital, discharge time is determined by your                       treatment team.  _  Patients discharged the day of surgery will not be allowed to drive home.  You will need someone to drive you home and stay with you the night of your procedure.    Please read over the following fact sheets that you were given:   [facility] Preparing for Surgery and or MRSA Information   _x___ Take anti-hypertensive listed below, cardiac, seizure, asthma,     anti-reflux and psychiatric medicines. These include:  1. albuterol (PROVENTIL   2.escitalopram (LEXAPRO) 20 MG tablet  3.gabapentin (NEURONTIN)  4.levothyroxine (SYNTHROID  5.metoprolol succinate (TOPROL-XL  6.pantoprazole (PROTONIX) 40 MG tablet  ____Fleets enema or Magnesium Citrate as directed.   _x___ Use CHG Soap or sage wipes as directed on instruction sheet   __x__ Use inhalers on the day of surgery and bring to hospital day of surgery  ____ Stop Metformin and Janumet 2 days prior to surgery.    ____ Take 1/2 of usual insulin dose the night before surgery and none on the morning     surgery.   _x___ Follow recommendations from Cardiologist, Pulmonologist or PCP regarding          stopping Aspirin, Coumadin, Plavix ,Eliquis, Effient, or Pradaxa, and Pletal.  X____Stop Anti-inflammatories such as Advil, Aleve, Ibuprofen, Motrin, Naproxen, Naprosyn, Goodies powders or aspirin products. OK to take Tylenol and                          Celebrex.   _x___ Stop supplements until after  surgery.  But may continue Vitamin D, Vitamin B,       and multivitamin.   ____ Bring C-Pap to the hospital.

## 2018-09-12 NOTE — Pre-Procedure Instructions (Signed)
Abnormal EKG, secure chat with Dr Rosey Bath.

## 2018-09-13 ENCOUNTER — Other Ambulatory Visit: Payer: Self-pay

## 2018-09-13 ENCOUNTER — Other Ambulatory Visit
Admission: RE | Admit: 2018-09-13 | Discharge: 2018-09-13 | Disposition: A | Discharge status: Home / Self Care | Payer: Medicare Other | Source: Ambulatory Visit | Attending: Surgery | Admitting: Surgery

## 2018-09-13 DIAGNOSIS — Z01818 Encounter for other preprocedural examination: Secondary | ICD-10-CM | POA: Diagnosis not present

## 2018-09-14 LAB — NOVEL CORONAVIRUS, NAA (HOSP ORDER, SEND-OUT TO REF LAB; TAT 18-24 HRS): SARS-CoV-2, NAA: NOT DETECTED

## 2018-09-16 NOTE — Pre-Procedure Instructions (Signed)
EKG READ SB BY DR Ubaldo Glassing

## 2018-09-17 ENCOUNTER — Ambulatory Visit
Admission: RE | Admit: 2018-09-17 | Discharge: 2018-09-17 | Disposition: A | Discharge status: Home / Self Care | Payer: Medicare Other | Attending: Surgery | Admitting: Surgery

## 2018-09-17 ENCOUNTER — Ambulatory Visit: Payer: Medicare Other | Admitting: Anesthesiology

## 2018-09-17 ENCOUNTER — Other Ambulatory Visit: Payer: Self-pay

## 2018-09-17 ENCOUNTER — Encounter
Admission: RE | Disposition: A | Discharge status: Home / Self Care | Payer: Self-pay | Source: Home / Self Care | Attending: Surgery

## 2018-09-17 ENCOUNTER — Ambulatory Visit: Payer: Medicare Other

## 2018-09-17 ENCOUNTER — Encounter: Payer: Self-pay | Admitting: *Deleted

## 2018-09-17 DIAGNOSIS — K7581 Nonalcoholic steatohepatitis (NASH): Secondary | ICD-10-CM | POA: Insufficient documentation

## 2018-09-17 DIAGNOSIS — K589 Irritable bowel syndrome without diarrhea: Secondary | ICD-10-CM | POA: Insufficient documentation

## 2018-09-17 DIAGNOSIS — Z853 Personal history of malignant neoplasm of breast: Secondary | ICD-10-CM | POA: Insufficient documentation

## 2018-09-17 DIAGNOSIS — G8929 Other chronic pain: Secondary | ICD-10-CM | POA: Insufficient documentation

## 2018-09-17 DIAGNOSIS — Z9012 Acquired absence of left breast and nipple: Secondary | ICD-10-CM | POA: Diagnosis not present

## 2018-09-17 DIAGNOSIS — F329 Major depressive disorder, single episode, unspecified: Secondary | ICD-10-CM | POA: Insufficient documentation

## 2018-09-17 DIAGNOSIS — E039 Hypothyroidism, unspecified: Secondary | ICD-10-CM | POA: Insufficient documentation

## 2018-09-17 DIAGNOSIS — I10 Essential (primary) hypertension: Secondary | ICD-10-CM | POA: Insufficient documentation

## 2018-09-17 DIAGNOSIS — Z9884 Bariatric surgery status: Secondary | ICD-10-CM | POA: Diagnosis not present

## 2018-09-17 DIAGNOSIS — K219 Gastro-esophageal reflux disease without esophagitis: Secondary | ICD-10-CM | POA: Diagnosis not present

## 2018-09-17 DIAGNOSIS — M797 Fibromyalgia: Secondary | ICD-10-CM | POA: Diagnosis not present

## 2018-09-17 DIAGNOSIS — F419 Anxiety disorder, unspecified: Secondary | ICD-10-CM | POA: Diagnosis not present

## 2018-09-17 DIAGNOSIS — Z7951 Long term (current) use of inhaled steroids: Secondary | ICD-10-CM | POA: Diagnosis not present

## 2018-09-17 DIAGNOSIS — G2581 Restless legs syndrome: Secondary | ICD-10-CM | POA: Diagnosis not present

## 2018-09-17 DIAGNOSIS — X58XXXA Exposure to other specified factors, initial encounter: Secondary | ICD-10-CM | POA: Diagnosis not present

## 2018-09-17 DIAGNOSIS — J45909 Unspecified asthma, uncomplicated: Secondary | ICD-10-CM | POA: Insufficient documentation

## 2018-09-17 DIAGNOSIS — E785 Hyperlipidemia, unspecified: Secondary | ICD-10-CM | POA: Diagnosis not present

## 2018-09-17 DIAGNOSIS — Z419 Encounter for procedure for purposes other than remedying health state, unspecified: Secondary | ICD-10-CM

## 2018-09-17 DIAGNOSIS — Z79899 Other long term (current) drug therapy: Secondary | ICD-10-CM | POA: Insufficient documentation

## 2018-09-17 DIAGNOSIS — M545 Low back pain: Secondary | ICD-10-CM | POA: Diagnosis not present

## 2018-09-17 DIAGNOSIS — G4700 Insomnia, unspecified: Secondary | ICD-10-CM | POA: Diagnosis not present

## 2018-09-17 DIAGNOSIS — K7460 Unspecified cirrhosis of liver: Secondary | ICD-10-CM | POA: Diagnosis not present

## 2018-09-17 DIAGNOSIS — M1712 Unilateral primary osteoarthritis, left knee: Secondary | ICD-10-CM | POA: Insufficient documentation

## 2018-09-17 DIAGNOSIS — S83242A Other tear of medial meniscus, current injury, left knee, initial encounter: Secondary | ICD-10-CM | POA: Diagnosis present

## 2018-09-17 HISTORY — PX: KNEE ARTHROSCOPY: SHX127

## 2018-09-17 SURGERY — ARTHROSCOPY, KNEE
Anesthesia: General | Laterality: Left

## 2018-09-17 MED ORDER — BUPIVACAINE-EPINEPHRINE (PF) 0.5% -1:200000 IJ SOLN
INTRAMUSCULAR | Status: DC | PRN
  Administered 2018-09-17: 46 mL

## 2018-09-17 MED ORDER — LIDOCAINE HCL (PF) 1 % IJ SOLN
INTRAMUSCULAR | Status: DC | PRN
  Administered 2018-09-17: 30 mL

## 2018-09-17 MED ORDER — BUPIVACAINE-EPINEPHRINE (PF) 0.5% -1:200000 IJ SOLN
INTRAMUSCULAR | Status: AC
  Filled 2018-09-17: qty 30

## 2018-09-17 MED ORDER — LIDOCAINE HCL (PF) 1 % IJ SOLN
INTRAMUSCULAR | Status: AC
  Filled 2018-09-17: qty 30

## 2018-09-17 MED ORDER — LACTATED RINGERS IV SOLN
INTRAVENOUS | Status: DC
  Administered 2018-09-17: 12:00:00 via INTRAVENOUS

## 2018-09-17 MED ORDER — FENTANYL CITRATE (PF) 100 MCG/2ML IJ SOLN
INTRAMUSCULAR | Status: AC
  Filled 2018-09-17: qty 2

## 2018-09-17 MED ORDER — TRAMADOL HCL 50 MG PO TABS: 50.0000 mg | ORAL_TABLET | Freq: Four times a day (QID) | ORAL | 0 refills | Status: DC | PRN

## 2018-09-17 MED ORDER — SCOPOLAMINE 1 MG/3DAYS TD PT72
MEDICATED_PATCH | TRANSDERMAL | Status: AC
  Administered 2018-09-17: 1.5 mg
  Filled 2018-09-17: qty 1

## 2018-09-17 MED ORDER — FENTANYL CITRATE (PF) 100 MCG/2ML IJ SOLN: 25.0000 ug | INTRAMUSCULAR | Status: DC | PRN

## 2018-09-17 MED ORDER — LIDOCAINE HCL (CARDIAC) PF 100 MG/5ML IV SOSY
PREFILLED_SYRINGE | INTRAVENOUS | Status: DC | PRN
  Administered 2018-09-17: 100 mg via INTRAVENOUS

## 2018-09-17 MED ORDER — PHENYLEPHRINE HCL (PRESSORS) 10 MG/ML IV SOLN
INTRAVENOUS | Status: DC | PRN
  Administered 2018-09-17: 100 ug via INTRAVENOUS
  Administered 2018-09-17: 50 ug via INTRAVENOUS

## 2018-09-17 MED ORDER — DEXAMETHASONE SODIUM PHOSPHATE 10 MG/ML IJ SOLN
INTRAMUSCULAR | Status: AC
  Filled 2018-09-17: qty 1

## 2018-09-17 MED ORDER — ONDANSETRON HCL 4 MG/2ML IJ SOLN
INTRAMUSCULAR | Status: DC | PRN
  Administered 2018-09-17: 4 mg via INTRAVENOUS

## 2018-09-17 MED ORDER — CEFAZOLIN SODIUM-DEXTROSE 2-4 GM/100ML-% IV SOLN
2.0000 g | Freq: Once | INTRAVENOUS | Status: AC
  Administered 2018-09-17: 2 g via INTRAVENOUS

## 2018-09-17 MED ORDER — DEXAMETHASONE SODIUM PHOSPHATE 10 MG/ML IJ SOLN
INTRAMUSCULAR | Status: DC | PRN
  Administered 2018-09-17: 8 mg via INTRAVENOUS

## 2018-09-17 MED ORDER — EPHEDRINE SULFATE 50 MG/ML IJ SOLN
INTRAMUSCULAR | Status: DC | PRN
  Administered 2018-09-17 (×2): 10 mg via INTRAVENOUS

## 2018-09-17 MED ORDER — ONDANSETRON HCL 4 MG/2ML IJ SOLN
INTRAMUSCULAR | Status: AC
  Filled 2018-09-17: qty 2

## 2018-09-17 MED ORDER — ACETAMINOPHEN 10 MG/ML IV SOLN
INTRAVENOUS | Status: DC | PRN
  Administered 2018-09-17: 1000 mg via INTRAVENOUS

## 2018-09-17 MED ORDER — PROPOFOL 10 MG/ML IV BOLUS
INTRAVENOUS | Status: AC
  Filled 2018-09-17: qty 20

## 2018-09-17 MED ORDER — PROPOFOL 10 MG/ML IV BOLUS
INTRAVENOUS | Status: DC | PRN
  Administered 2018-09-17: 150 mg via INTRAVENOUS

## 2018-09-17 MED ORDER — FENTANYL CITRATE (PF) 100 MCG/2ML IJ SOLN
INTRAMUSCULAR | Status: DC | PRN
  Administered 2018-09-17 (×2): 50 ug via INTRAVENOUS

## 2018-09-17 MED ORDER — LIDOCAINE HCL (PF) 2 % IJ SOLN
INTRAMUSCULAR | Status: AC
  Filled 2018-09-17: qty 10

## 2018-09-17 MED ORDER — CEFAZOLIN SODIUM-DEXTROSE 2-4 GM/100ML-% IV SOLN
INTRAVENOUS | Status: AC
  Filled 2018-09-17: qty 100

## 2018-09-17 MED ORDER — ACETAMINOPHEN 10 MG/ML IV SOLN
INTRAVENOUS | Status: AC
  Filled 2018-09-17: qty 100

## 2018-09-17 MED ORDER — PROMETHAZINE HCL 25 MG/ML IJ SOLN: 6.2500 mg | INTRAMUSCULAR | Status: DC | PRN

## 2018-09-17 SURGICAL SUPPLY — 34 items
"PENCIL ELECTRO HAND CTR " (MISCELLANEOUS) IMPLANT
BANDAGE ACE 6X5 VEL STRL LF (GAUZE/BANDAGES/DRESSINGS) ×2 IMPLANT
BLADE FULL RADIUS 3.5 (BLADE) ×2 IMPLANT
BLADE SHAVER 4.5X7 STR FR (MISCELLANEOUS) ×2 IMPLANT
CHLORAPREP W/TINT 26 (MISCELLANEOUS) ×2 IMPLANT
COVER WAND RF STERILE (DRAPES) ×2 IMPLANT
CUFF TOURN 24 STER (MISCELLANEOUS) IMPLANT
CUFF TOURN 30 STER DUAL PORT (MISCELLANEOUS) IMPLANT
DRAPE C-ARM XRAY 36X54 (DRAPES) ×2 IMPLANT
ELECT REM PT RETURN 9FT ADLT (ELECTROSURGICAL) ×2
ELECTRODE REM PT RTRN 9FT ADLT (ELECTROSURGICAL) ×1 IMPLANT
GAUZE SPONGE 4X4 12PLY STRL (GAUZE/BANDAGES/DRESSINGS) ×2 IMPLANT
GLOVE BIO SURGEON STRL SZ8 (GLOVE) ×4 IMPLANT
GLOVE INDICATOR 8.0 STRL GRN (GLOVE) ×2 IMPLANT
GOWN STRL REUS W/ TWL LRG LVL3 (GOWN DISPOSABLE) ×1 IMPLANT
GOWN STRL REUS W/ TWL XL LVL3 (GOWN DISPOSABLE) ×1 IMPLANT
GOWN STRL REUS W/TWL LRG LVL3 (GOWN DISPOSABLE) ×1
GOWN STRL REUS W/TWL XL LVL3 (GOWN DISPOSABLE) ×1
GRAFT FILLER BONE 5ML (Knees) IMPLANT
IV LACTATED RINGER IRRG 3000ML (IV SOLUTION) ×2
IV LR IRRIG 3000ML ARTHROMATIC (IV SOLUTION) ×2 IMPLANT
KIT ACCUFILL 5CC (Knees) ×1 IMPLANT
KIT KNEE SCP 414.502 (Knees) ×2 IMPLANT
KIT TURNOVER KIT A (KITS) ×2 IMPLANT
MANIFOLD NEPTUNE II (INSTRUMENTS) ×2 IMPLANT
NDL HYPO 21X1.5 SAFETY (NEEDLE) ×1 IMPLANT
NEEDLE HYPO 21X1.5 SAFETY (NEEDLE) ×2 IMPLANT
PACK ARTHROSCOPY KNEE (MISCELLANEOUS) ×2 IMPLANT
PENCIL ELECTRO HAND CTR (MISCELLANEOUS) IMPLANT
SUT PROLENE 4 0 PS 2 18 (SUTURE) IMPLANT
SUT TI-CRON 2-0 W/10 SWGD (SUTURE) IMPLANT
SYR 50ML LL SCALE MARK (SYRINGE) ×2 IMPLANT
TUBING ARTHRO INFLOW-ONLY STRL (TUBING) ×2 IMPLANT
WAND WEREWOLF FLOW 90D (MISCELLANEOUS) ×2 IMPLANT

## 2018-09-17 NOTE — Anesthesia Procedure Notes (Signed)
Procedure Name: LMA Insertion Date/Time: 09/17/2018 1:05 PM Performed by: Rona Ravens, CRNA Pre-anesthesia Checklist: Patient identified, Emergency Drugs available, Suction available and Patient being monitored Patient Re-evaluated:Patient Re-evaluated prior to induction Oxygen Delivery Method: Circle system utilized Preoxygenation: Pre-oxygenation with 100% oxygen Induction Type: IV induction LMA: LMA inserted LMA Size: 4.5 Number of attempts: 1 Dental Injury: Teeth and Oropharynx as per pre-operative assessment

## 2018-09-17 NOTE — Anesthesia Preprocedure Evaluation (Addendum)
Anesthesia Evaluation  Patient identified by MRN, date of birth, ID band Patient awake    Reviewed: Allergy & Precautions, H&P , NPO status , Patient's Chart, lab work & pertinent test results  History of Anesthesia Complications (+) PONV and history of anesthetic complications  Airway Mallampati: III  TM Distance: <3 FB Neck ROM: limited    Dental  (+) Chipped   Pulmonary neg shortness of breath, asthma ,           Cardiovascular Exercise Tolerance: Good hypertension,      Neuro/Psych  Headaches, PSYCHIATRIC DISORDERS  Neuromuscular disease negative psych ROS   GI/Hepatic GERD  Medicated and Controlled,(+) Hepatitis -  Endo/Other  Hypothyroidism   Renal/GU Renal disease     Musculoskeletal  (+) Arthritis , Fibromyalgia -  Abdominal   Peds  Hematology negative hematology ROS (+)   Anesthesia Other Findings Past Medical History: No date: Anxiety No date: Arthritis No date: Asthma 01/2010: Breast cancer ([place])     Comment:  left mastectomy, LN neg, on femara No date: Chronic low back pain No date: Cirrhosis of liver ([facility]) No date: Depression No date: Fibromyalgia No date: GERD (gastroesophageal reflux disease) No date: History of chicken pox No date: HTN (hypertension) No date: Hyperlipidemia No date: Hypothyroidism No date: IBS (irritable bowel syndrome) No date: Migraines No date: NASH (nonalcoholic steatohepatitis)     Comment:  [facility] Dr[facility] Gerald [facility]ter at [facility] No date: PONV (postoperative nausea and vomiting) No date: RLS (restless legs syndrome) No date: Seasonal allergies No date: Trigeminal neuralgia  Past Surgical History: No date: ABDOMINAL HYSTERECTOMY 2006: ANKLE SURGERY 2011: BREAST BIOPSY     Comment:  [place] 2005: CHOLECYSTECTOMY No date: CYSTOCELE REPAIR No date: DILATION AND CURETTAGE OF UTERUS 2008: GASTRIC BYPASS 2011: MASTECTOMY     Comment:  Breast Cancer No date: RECTOCELE  REPAIR 2001: ROTATOR CUFF REPAIR No date: SEPTOPLASTY age 14: TONSILLECTOMY 1991: TOTAL ABDOMINAL HYSTERECTOMY W/ BILATERAL SALPINGOOPHORECTOMY No date: VAGINAL DELIVERY     Comment:  x3  BMI    Body Mass Index:  41.57 kg/m      Reproductive/Obstetrics negative OB ROS                             Anesthesia Physical Anesthesia Plan  ASA: III  Anesthesia Plan: General LMA   Post-op Pain Management: GA combined w/ Regional for post-op pain   Induction: Intravenous  PONV Risk Score and Plan: Dexamethasone, Ondansetron, Midazolam and Treatment may vary due to age or medical condition  Airway Management Planned: LMA  Additional Equipment:   Intra-op Plan:   Post-operative Plan: Extubation in OR  Informed Consent: I have reviewed the patients History and Physical, chart, labs and discussed the procedure including the risks, benefits and alternatives for the proposed anesthesia with the patient or authorized representative who has indicated his/her understanding and acceptance.     Dental Advisory Given  Plan Discussed with: Anesthesiologist, CRNA and Surgeon  Anesthesia Plan Comments: (Consented for post op adductor block PRN  Patient consented for risks of anesthesia including but not limited to:  - adverse reactions to medications - damage to teeth, lips or other oral mucosa - sore throat or hoarseness - Damage to heart, brain, lungs or loss of life  Patient voiced understanding.)       Anesthesia Quick Evaluation

## 2018-09-17 NOTE — Anesthesia Post-op Follow-up Note (Signed)
Anesthesia QCDR form completed.        

## 2018-09-17 NOTE — H&P (Signed)
Paper H&P to be scanned into permanent record. H&P reviewed and patient re-examined. No changes. 

## 2018-09-17 NOTE — Transfer of Care (Signed)
Immediate Anesthesia Transfer of Care Note  Patient: Gwendolyn [name]  Procedure(s) Performed: KNEE ARTHROSCOPY WITH DEBRIDEMENT, PARTIAL MEDIAL MENISCECTOMY AND SUB-CHONDROPLASTY OF A BONE MARROW LESION INVOLVING THE MEIDAL TIBIAL PLATEAU (Left )  Patient Location: PACU  Anesthesia Type:General  Level of Consciousness: awake, alert  and oriented  Airway & Oxygen Therapy: Patient Spontanous Breathing and Patient connected to face mask oxygen  Post-op Assessment: Report given to RN and Post -op Vital signs reviewed and stable  Post vital signs: Reviewed and stable  Last Vitals:  Vitals Value Taken Time  BP    Temp    Pulse 79 09/17/2018  2:36 PM  Resp 9 09/17/2018  2:36 PM  SpO2 98 % 09/17/2018  2:36 PM  Vitals shown include unvalidated device data.  Last Pain:  Vitals:   09/17/18 1147  TempSrc: Oral  PainSc: 4          Complications: No apparent anesthesia complications

## 2018-09-17 NOTE — Discharge Instructions (Addendum)
Orthopedic discharge instructions: Keep dressing dry and intact.  May shower after dressing changed on post-op day #4 (Saturday).  Cover stitches with Band-Aids after drying off. Apply ice frequently to knee. Take ibuprofen 600-800 mg TID with meals for 7-10 days, then as necessary. Take pain medication as prescribed when needed.  May supplement with ES Tylenol if necessary. Partial weight-bear on left leg as tolerated - use walker for balance and support. Follow-up in 10-14 days or as scheduled.    AMBULATORY SURGERY  DISCHARGE INSTRUCTIONS   1) The drugs that you were given will stay in your system until tomorrow so for the next 24 hours you should not:  A) Drive an automobile B) Make any legal decisions C) Drink any alcoholic beverage   2) You may resume regular meals tomorrow.  Today it is better to start with liquids and gradually work up to solid foods.  You may eat anything you prefer, but it is better to start with liquids, then soup and crackers, and gradually work up to solid foods.   3) Please notify your doctor immediately if you have any unusual bleeding, trouble breathing, redness and pain at the surgery site, drainage, fever, or pain not relieved by medication.    4) Additional Instructions:        Please contact your physician with any problems or Same Day Surgery at [phone], Monday through Friday 6 am to 4 pm, or [place] at [facility] number at [phone].

## 2018-09-17 NOTE — Op Note (Signed)
09/17/2018  2:33 PM  Patient:   Gwendolyn [name]  Pre-Op Diagnosis:   Complex tear of medial meniscus, degenerative joint disease, and insufficiency fracture of medial tibial plateau, left knee.  Postoperative diagnosis:   Same  Procedure:   Arthroscopic debridement with partial medial meniscectomy, abrasion chondroplasty of multiple areas of grade II-III chondromalacia, and sub-chondroplasty of medial tibial plateau, left knee.  Surgeon:   Pascal Lux, M.D.  Anesthesia:   General LMA.  Findings:   As above.  There was extensive complex tearing of the middle and posterior portions of the medial meniscus, as well as mild fraying of the central most portion of the lateral meniscus.  There were diffuse grade 2-3 chondromalacial changes involving the patella, the femoral trochlea, the medial femoral condyle, the medial tibial plateau, and the lateral tibial plateau.  The anterior and posterior cruciate ligaments both were in satisfactory condition.  Complications:   None.  EBL:   5 cc.  Total fluids:   500 cc of crystalloid.  Tourniquet time:   None  Drains:   None  Closure:   4-0 Prolene interrupted sutures.  Brief clinical note:   The patient is a 73 year old female with a 1.5-year history of medial sided left knee pain. The symptoms have persisted despite medications, activity modification, injections, etc. Her history and examination are consistent with a complex medial meniscus tear with underlying degenerative joint disease. The MRI scan also demonstrated evidence of increased bone marrow edema involving the weightbearing portion of the medial tibial plateau, consistent with an insufficiency fracture. The patient presents at this time for arthroscopy, debridement, partial medial meniscectomy, and a sub-chondroplasty of the medial tibial plateau.  Procedure:   The patient was brought into the operating room and lain in the supine position. After adequate general laryngeal mask  anesthesia was obtained, a timeout was performed to verify the appropriate side. The patient's left knee was injected sterilely using a solution of 30 cc of 1% lidocaine and 30 cc of 0.5% Sensorcaine with epinephrine. The left lower extremity was prepped with ChloraPrep solution before being draped sterilely. Preoperative antibiotics were administered. The expected portal sites and medial stab incision were injected with 0.5% Sensorcaine with epinephrine.    Under fluoroscopic guidance in AP and lateral projections, the entry point for the sub-chondroplasty trocar was identified along the medial portion of the medial tibial plateau and the trocar inserted to the appropriate depth, corresponding to the center of the area of increased bone marrow edema identified on MRI scan. After mixing the bone cement on the back table, a total of 3 cc of the bone cement was injected into the medial tibial plateau. Small portions were injected in multiple directions as the cannula was slowly rotated within the medial tibial plateau. The trocar was reintroduced and left in place while the cement cured for 8 minutes, as per the standard [employer] protocol. The trocar was then removed and fluoroscopic images obtained in AP and lateral projections. These demonstrated excellent fill of the medial tibial plateau defect. A small amount of the bone cement had extravasated into the medial subcutaneous tissues.  Attention was then directed to the arthroscopic portion of the procedure. The camera was placed in the anterolateral portal and instrumentation performed through the anteromedial portal. The knee was sequentially examined beginning in the suprapatellar pouch, then progressing to the patellofemoral space, the medial gutter compartment, the notch, and finally the lateral compartment and gutter. The findings were as described above. Abundant reactive synovial  tissues anteriorly were debrided using the full-radius resector in  order to improve visualization. The areas of extensive tearing of the medial meniscus were debrided back to stable margins using a combination of the straight and left side biting baskets as well as the full-radius resector. The adequacy of resection and stability of the remaining rim were verified by probing. Laterally, there was a small area of meniscal fraying involving the central portion of the central third of the meniscus. This area was debrided back to stable margins using the full-radius resector. Multiple areas of loose articular cartilage (grade II-III chondromalacia) also were debrided back to stable margins using the full-radius resector.    The instruments were removed from the joint after suctioning the excess fluid. The portal sites were closed using 4-0 Prolene interrupted sutures before a sterile bulky dressing was applied to the knee. The patient was then awakened, extubated, and returned to the recovery room in satisfactory condition after tolerating the procedure well.

## 2018-09-17 NOTE — Anesthesia Post-op Follow-up Note (Deleted)
Anesthesia QCDR form completed.        

## 2018-09-18 ENCOUNTER — Encounter: Payer: Self-pay | Admitting: Surgery

## 2018-09-18 NOTE — Anesthesia Postprocedure Evaluation (Signed)
Anesthesia Post Note  Patient: [name]  Procedure(s) Performed: KNEE ARTHROSCOPY WITH DEBRIDEMENT, PARTIAL MEDIAL MENISCECTOMY AND SUB-CHONDROPLASTY OF A BONE MARROW LESION INVOLVING THE MEIDAL TIBIAL PLATEAU (Left )  Patient location during evaluation: PACU Anesthesia Type: General Level of consciousness: awake and alert Pain management: pain level controlled Vital Signs Assessment: post-procedure vital signs reviewed and stable Respiratory status: spontaneous breathing, nonlabored ventilation, respiratory function stable and patient connected to nasal cannula oxygen Cardiovascular status: blood pressure returned to baseline and stable Postop Assessment: no apparent nausea or vomiting Anesthetic complications: no     Last Vitals:  Vitals:   09/17/18 1509 09/17/18 1540  BP: 133/67 129/67  Pulse: 80 78  Resp: 16 16  Temp: 36.7 C   SpO2: 94% 96%    Last Pain:  Vitals:   09/17/18 1540  TempSrc:   PainSc: 3                  Martha Clan

## 2018-09-20 NOTE — H&P (Signed)
Subjective:  Chief complaint: Left knee pain.  The patient is a 73 y.o. female with a 1+ year history of medial sided left knee pain without any specific injury.  She saw Gwendolyn Dixon, PA-C, who tried steroid injections and medications which provided little if any improvement in her left knee symptoms.  Subsequently, she underwent an MRI scan which demonstrated a medial meniscus tear, some degenerative joint disease, and an insufficiency fracture involving the weightbearing portion of the medial tibial plateau.  She presents at this time for definitive management of her left knee symptoms.  Patient Active Problem List   Diagnosis Date Noted  . ARF (acute renal failure) ([place]) 01/09/2018  . Chronic venous insufficiency 11/28/2017  . Lymphedema 11/28/2017  . Leg pain 11/28/2017  . GERD (gastroesophageal reflux disease) 11/28/2017  . Unspecified hypothyroidism 12/10/2013  . Medicare annual wellness visit, subsequent 07/08/2013  . Weakness of both legs 07/08/2013  . Night sweats 01/16/2013  . Tremor 01/16/2013  . Palpitations 01/16/2013  . Fibromyalgia 07/22/2012  . Essential hypertension, benign 06/19/2012  . Bereavement 06/19/2012  . Depression 03/28/2012  . Insomnia 03/28/2012  . Lumbar back pain 12/22/2011  . HX: breast cancer 09/18/2011  . Abnormal CT lung screening 08/07/2011  . NASH (nonalcoholic steatohepatitis) 06/12/2011   Past Medical History:  Diagnosis Date  . Anxiety   . Arthritis   . Asthma   . Breast cancer ([place]) 01/2010   left mastectomy, LN neg, on femara  . Chronic low back pain   . Cirrhosis of liver ([place])   . Depression   . Fibromyalgia   . GERD (gastroesophageal reflux disease)   . History of chicken pox   . HTN (hypertension)   . Hyperlipidemia   . Hypothyroidism   . IBS (irritable bowel syndrome)   . Migraines   . NASH (nonalcoholic steatohepatitis)    Followed by Dr. Gerald Dexter at [facility]  . PONV (postoperative nausea and vomiting)   . RLS (restless legs  syndrome)   . Seasonal allergies   . Trigeminal neuralgia     Past Surgical History:  Procedure Laterality Date  . ABDOMINAL HYSTERECTOMY    . ANKLE SURGERY  2006  . BRE[place] BIOPSY  2011   [place]  . CHOLECYSTECTOMY  2005  . CYSTOCELE REPAIR    . DILATION AND CURETTAGE OF UTERUS    . GASTRIC BYPASS  2008  . KNEE ARTHROSCOPY Left 09/17/2018   Procedure: KNEE ARTHROSCOPY WITH DEBRIDEMENT, PARTIAL MEDIAL MENISCECTOMY AND SUB-CHONDROPLASTY OF A BONE MARROW LESION INVOLVING THE MEIDAL TIBIAL PLATEAU;  Surgeon: Corky Mull, MD;  Location: [facility] ORS;  Service: Orthopedics;  Laterality: Left;  Marland Kitchen MASTECTOMY  2011   Breast Cancer  . RECTOCELE REPAIR    . ROTATOR CUFF REPAIR  2001  . SEPTOPLASTY    . TONSILLECTOMY  age 56  . TOTAL ABDOMINAL HYSTERECTOMY W/ BILATERAL SALPINGOOPHORECTOMY  1991  . VAGINAL DELIVERY     x3    No medications prior to admission.   Allergies  Allergen Reactions  . Codeine Nausea And Vomiting  . Oxycodone Itching  . Silver Other (See Comments)    (Tegaderm) Blisters.  Okay on hands.    Social History   Tobacco Use  . Smoking status: Never Smoker  . Smokeless tobacco: Never Used  Substance Use Topics  . Alcohol use: Not Currently    Comment: Occasional    Family History  Problem Relation Age of Onset  . COPD Mother   . Alzheimer's disease Father   .  Cancer Sister 53       Breast  . Arthritis Maternal Grandmother   . Arthritis Maternal Grandfather      Review of Systems: As noted above. The patient denies any chest pain, shortness of breath, nausea, vomiting, diarrhea, constipation, belly pain, blood in his/her stool, or burning with urination.  Objective:   Physical Exam: General:  Alert, no acute distress Psychiatric:  Patient is competent for consent with normal mood and affect Cardiovascular:  RRR  Respiratory:  Clear to auscultation. No wheezing. Non-labored breathing GI:  Abdomen is soft and non-tender Skin:  No lesions in the area of chief  complaint Neurologic:  Sensation intact distally Lymphatic:  No axillary or cervical lymphadenopathy  Orthopedic Exam:  Orthopedic examination is limited to the left knee and lower extremity.  GAIT: Mild limp, but uses no assistive devices. ALIGNMENT: Normal SKIN: Unremarkable SWELLING: Minimal EFFUSION: Trace WARMTH: None TENDERNESS: Moderate along the medial joint line and over medial tibial plateau ROM: 0-110 degrees with mild pain in maximal flexion McMURRAY'S: Positive PATELLOFEMORAL: Normal tracking with no peri-patellar tenderness and negative apprehension sign CREPITUS: No LACHMAN'S: Negative PIVOT SHIFT: Negative ANTERIOR DRAWER: Negative POSTERIOR DRAWER: Negative VARUS/VALGUS: Stable  She is neurovascularly intact to the left lower extremity and foot.  Imaging Review: A recent MRI scan of the left knee is available for review. By report, the scan demonstrates evidence of a complex tear of the central and posterior portions of the medial meniscus with a horizontal and vertical components. There also is evidence of at least moderate degenerative changes involving the medial portion of the medial tibial plateau, and an area of subchondral edema involving the medial portion of the medial tibial plateau. Laterally, the meniscus and articular surfaces appear to be in good condition. No ligamentous pathology is noted. Both the films and report were reviewed by myself and discussed with the patient.  Assessment: Medial meniscus tear with early degenerative joint disease and insufficiency fracture of medial tibial plateau, left knee.  Plan: The treatment options, including both surgical and nonsurgical choices, have been discussed in detail with the patient and the patient has elected to proceed with surgical intervention to include a left knee arthroscopy with debridement, partial medial meniscectomy, and a subchondroplasty of the medial tibial plateau lesion. The risks (including  bleeding, infection, nerve and/or blood vessel injury, persistent or recurrent pain, loosening or failure of the components, leg length inequality, dislocation, need for further surgery, blood clots, strokes, heart attacks or arrhythmias, pneumonia, etc.) and benefits of the surgical procedure were discussed. The patient states her understanding and agrees to proceed. She agrees to a blood transfusion if necessary. A formal written consent will be obtained by the nursing staff.

## 2018-10-08 ENCOUNTER — Emergency Department
Admission: EM | Admit: 2018-10-08 | Discharge: 2018-10-08 | Disposition: A | Discharge status: Home / Self Care | Payer: Medicare Other | Attending: Emergency Medicine | Admitting: Emergency Medicine

## 2018-10-08 ENCOUNTER — Other Ambulatory Visit: Payer: Self-pay

## 2018-10-08 ENCOUNTER — Emergency Department: Payer: Medicare Other

## 2018-10-08 ENCOUNTER — Encounter: Payer: Self-pay | Admitting: Emergency Medicine

## 2018-10-08 DIAGNOSIS — I10 Essential (primary) hypertension: Secondary | ICD-10-CM | POA: Insufficient documentation

## 2018-10-08 DIAGNOSIS — Z79899 Other long term (current) drug therapy: Secondary | ICD-10-CM | POA: Diagnosis not present

## 2018-10-08 DIAGNOSIS — R531 Weakness: Secondary | ICD-10-CM | POA: Diagnosis present

## 2018-10-08 DIAGNOSIS — J45909 Unspecified asthma, uncomplicated: Secondary | ICD-10-CM | POA: Diagnosis not present

## 2018-10-08 DIAGNOSIS — Z853 Personal history of malignant neoplasm of breast: Secondary | ICD-10-CM | POA: Diagnosis not present

## 2018-10-08 DIAGNOSIS — M25562 Pain in left knee: Secondary | ICD-10-CM | POA: Diagnosis not present

## 2018-10-08 DIAGNOSIS — E860 Dehydration: Secondary | ICD-10-CM | POA: Diagnosis not present

## 2018-10-08 LAB — CBC WITH DIFFERENTIAL/PLATELET
Abs Immature Granulocytes: 0.01 10*3/uL (ref 0.00–0.07)
Basophils Absolute: 0 10*3/uL (ref 0.0–0.1)
Basophils Relative: 1 %
Eosinophils Absolute: 0.3 10*3/uL (ref 0.0–0.5)
Eosinophils Relative: 9 %
HCT: 41.7 % (ref 36.0–46.0)
Hemoglobin: 13.3 g/dL (ref 12.0–15.0)
Immature Granulocytes: 0 %
Lymphocytes Relative: 32 %
Lymphs Abs: 1.1 10*3/uL (ref 0.7–4.0)
MCH: 31.9 pg (ref 26.0–34.0)
MCHC: 31.9 g/dL (ref 30.0–36.0)
MCV: 100 fL (ref 80.0–100.0)
Monocytes Absolute: 0.3 10*3/uL (ref 0.1–1.0)
Monocytes Relative: 9 %
Neutro Abs: 1.7 10*3/uL (ref 1.7–7.7)
Neutrophils Relative %: 49 %
Platelets: 192 10*3/uL (ref 150–400)
RBC: 4.17 MIL/uL (ref 3.87–5.11)
RDW: 13.4 % (ref 11.5–15.5)
WBC: 3.5 10*3/uL — ABNORMAL LOW (ref 4.0–10.5)
nRBC: 0 % (ref 0.0–0.2)

## 2018-10-08 LAB — COMPREHENSIVE METABOLIC PANEL
ALT: 24 U/L (ref 0–44)
AST: 45 U/L — ABNORMAL HIGH (ref 15–41)
Albumin: 3.5 g/dL (ref 3.5–5.0)
Alkaline Phosphatase: 57 U/L (ref 38–126)
Anion gap: 9 (ref 5–15)
BUN: 24 mg/dL — ABNORMAL HIGH (ref 8–23)
CO2: 23 mmol/L (ref 22–32)
Calcium: 9.3 mg/dL (ref 8.9–10.3)
Chloride: 105 mmol/L (ref 98–111)
Creatinine, Ser: 1.14 mg/dL — ABNORMAL HIGH (ref 0.44–1.00)
GFR calc Af Amer: 56 mL/min — ABNORMAL LOW (ref >60)
GFR calc non Af Amer: 49 mL/min — ABNORMAL LOW (ref >60)
Glucose, Bld: 111 mg/dL — ABNORMAL HIGH (ref 70–99)
Potassium: 4.1 mmol/L (ref 3.5–5.1)
Sodium: 137 mmol/L (ref 135–145)
Total Bilirubin: 0.5 mg/dL (ref 0.3–1.2)
Total Protein: 6.5 g/dL (ref 6.5–8.1)

## 2018-10-08 LAB — URINALYSIS, COMPLETE (UACMP) WITH MICROSCOPIC
Bacteria, UA: NONE SEEN
Bilirubin Urine: NEGATIVE
Glucose, UA: NEGATIVE mg/dL
Hgb urine dipstick: NEGATIVE
Ketones, ur: NEGATIVE mg/dL
Nitrite: NEGATIVE
Protein, ur: NEGATIVE mg/dL
Specific Gravity, Urine: 1.018 (ref 1.005–1.030)
pH: 5 (ref 5.0–8.0)

## 2018-10-08 MED ORDER — SODIUM CHLORIDE 0.9 % IV SOLN
1000.0000 mL | Freq: Once | INTRAVENOUS | Status: AC
  Administered 2018-10-08: 10:00:00 1000 mL via INTRAVENOUS

## 2018-10-08 NOTE — ED Notes (Signed)
Pt calling son for ride home

## 2018-10-08 NOTE — ED Notes (Signed)
ED Provider at bedside. 

## 2018-10-08 NOTE — Discharge Instructions (Addendum)
Home health services have been arranged for you. Please follow up with Dr. Roland Rack as previously scheduled

## 2018-10-08 NOTE — ED Provider Notes (Signed)
[facility] Emergency Department Provider Note   ____________________________________________    I have reviewed the triage vital signs and the nursing notes.   HISTORY  Chief Complaint Weakness     HPI Gwendolyn [name] is a 73 y.o. female who presents with complaints of some weakness, left knee pain and difficulty caring for herself at home.  Patient had laparoscopic knee surgery earlier this month, initially was doing well but since then has had increased pain and problems with mobility which is led to her having difficulty caring for herself at home as she lives alone.  She denies fevers chills cough.  No myalgias.  No nausea or vomiting.  Does feel dehydrated as it is difficult to get to her kitchen to get something to drink.  Past Medical History:  Diagnosis Date  . Anxiety   . Arthritis   . Asthma   . Breast cancer ([place]) 01/2010   left mastectomy, LN neg, on femara  . Chronic low back pain   . Cirrhosis of liver ([place])   . Depression   . Fibromyalgia   . GERD (gastroesophageal reflux disease)   . History of chicken pox   . HTN (hypertension)   . Hyperlipidemia   . Hypothyroidism   . IBS (irritable bowel syndrome)   . Migraines   . NASH (nonalcoholic steatohepatitis)    Followed by Dr. Gerald Dexter at [facility]  . PONV (postoperative nausea and vomiting)   . RLS (restless legs syndrome)   . Seasonal allergies   . Trigeminal neuralgia     Patient Active Problem List   Diagnosis Date Noted  . ARF (acute renal failure) ([place]) 01/09/2018  . Chronic venous insufficiency 11/28/2017  . Lymphedema 11/28/2017  . Leg pain 11/28/2017  . GERD (gastroesophageal reflux disease) 11/28/2017  . Unspecified hypothyroidism 12/10/2013  . Medicare annual wellness visit, subsequent 07/08/2013  . Weakness of both legs 07/08/2013  . Night sweats 01/16/2013  . Tremor 01/16/2013  . Palpitations 01/16/2013  . Fibromyalgia 07/22/2012  . Essential hypertension, benign  06/19/2012  . Bereavement 06/19/2012  . Depression 03/28/2012  . Insomnia 03/28/2012  . Lumbar back pain 12/22/2011  . HX: breast cancer 09/18/2011  . Abnormal CT lung screening 08/07/2011  . NASH (nonalcoholic steatohepatitis) 06/12/2011    Past Surgical History:  Procedure Laterality Date  . ABDOMINAL HYSTERECTOMY    . ANKLE SURGERY  2006  . BRE[place] BIOPSY  2011   [place]  . CHOLECYSTECTOMY  2005  . CYSTOCELE REPAIR    . DILATION AND CURETTAGE OF UTERUS    . GASTRIC BYPASS  2008  . KNEE ARTHROSCOPY Left 09/17/2018   Procedure: KNEE ARTHROSCOPY WITH DEBRIDEMENT, PARTIAL MEDIAL MENISCECTOMY AND SUB-CHONDROPLASTY OF A BONE MARROW LESION INVOLVING THE MEIDAL TIBIAL PLATEAU;  Surgeon: Corky Mull, MD;  Location: [facility] ORS;  Service: Orthopedics;  Laterality: Left;  Marland Kitchen MASTECTOMY  2011   Breast Cancer  . RECTOCELE REPAIR    . ROTATOR CUFF REPAIR  2001  . SEPTOPLASTY    . TONSILLECTOMY  age 25  . TOTAL ABDOMINAL HYSTERECTOMY W/ BILATERAL SALPINGOOPHORECTOMY  1991  . VAGINAL DELIVERY     x3    Prior to Admission medications   Medication Sig Start Date End Date Taking? Authorizing Provider  acetaminophen (TYLENOL) 500 MG tablet Take 1,000 mg by mouth every 6 (six) hours as needed for moderate pain or headache.   Yes [provider]  albuterol (PROVENTIL HFA;VENTOLIN HFA) 108 (90 Base) MCG/ACT inhaler  Inhale 2 puffs into the lungs every 6 (six) hours as needed for wheezing or shortness of breath.   Yes [provider]  atorvastatin (LIPITOR) 40 MG tablet Take 40 mg by mouth at bedtime.    Yes [provider]  baclofen (LIORESAL) 10 MG tablet Take 10-20 mg by mouth See admin instructions. Take 10 mg in the morning and 20 mg at night   Yes [provider]  Buprenorphine (BUTRANS) 15 MCG/HR PTWK Place 15 mcg onto the skin every Wednesday.    Yes [provider]  calcium carbonate (OS-CAL - DOSED IN MG OF ELEMENTAL CALCIUM) 1250 (500 Ca) MG tablet Take  1 tablet by mouth daily.   Yes [provider]  Cholecalciferol (VITAMIN D) 50 MCG (2000 UT) tablet Take 2,000 Units by mouth daily.   Yes [provider]  enalapril (VASOTEC) 10 MG tablet Take 10 mg by mouth daily.   Yes [provider]  escitalopram (LEXAPRO) 20 MG tablet Take 20 mg by mouth daily.  07/19/17  Yes [provider]  fluticasone (FLONASE) 50 MCG/ACT nasal spray Place 1 spray into both nostrils 2 (two) times daily as needed for allergies.    Yes [provider]  fluticasone (FLOVENT HFA) 110 MCG/ACT inhaler Inhale 1 puff into the lungs 2 (two) times daily as needed (shortness of breath).    Yes [provider]  gabapentin (NEURONTIN) 300 MG capsule Take 300-600 mg by mouth See admin instructions. Take 300 mg in the morning and 600 mg at night   Yes [provider]  ibandronate (BONIVA) 150 MG tablet Take 150 mg by mouth every 30 (thirty) days.    Yes [provider]  levothyroxine (SYNTHROID, LEVOTHROID) 50 MCG tablet Take 1 tablet (50 mcg total) by mouth daily. 07/08/13  Yes Jackolyn Confer, MD  metoprolol succinate (TOPROL-XL) 25 MG 24 hr tablet Take 1 tablet (25 mg total) by mouth daily. 07/08/13  Yes Jackolyn Confer, MD  Multiple Vitamins-Minerals (MULTIVITAMIN WITH MINERALS) tablet Take 1 tablet by mouth daily.   Yes [provider]  omeprazole (PRILOSEC) 20 MG capsule Take 20 mg by mouth daily.    Yes [provider]  oxybutynin (DITROPAN-XL) 5 MG 24 hr tablet Take 5 mg by mouth at bedtime.    Yes [provider]  pantoprazole (PROTONIX) 40 MG tablet Take 1 tablet (40 mg total) by mouth daily. 07/08/13  Yes Jackolyn Confer, MD  rOPINIRole (REQUIP) 2 MG tablet Take 1 tablet (2 mg total) by mouth 2 (two) times daily. Patient taking differently: Take 2-4 mg by mouth See admin instructions. Take 2 mg in the morning and 4 mg at night 08/07/13  Yes Jackolyn Confer, MD  VITAMIN E PO  Take 1 tablet by mouth daily.   Yes [provider]     Allergies Codeine, Oxycodone, and Silver  Family History  Problem Relation Age of Onset  . COPD Mother   . Alzheimer's disease Father   . Cancer Sister 27       Breast  . Arthritis Maternal Grandmother   . Arthritis Maternal Grandfather     Social History Social History   Tobacco Use  . Smoking status: Never Smoker  . Smokeless tobacco: Never Used  Substance Use Topics  . Alcohol use: Not Currently    Comment: Occasional  . Drug use: No    Review of Systems  Constitutional: No fever/chills Eyes: No visual changes.  ENT: No  sore throat. Cardiovascular: Denies chest pain. Respiratory: Denies shortness of breath. Gastrointestinal: No abdominal pain.  No nausea, no vomiting.   Genitourinary: Negative for dysuria. Musculoskeletal: As above Skin: Negative for rash. Neurological: Negative for headaches   ____________________________________________   PHYSICAL EXAM:  VITAL SIGNS: ED Triage Vitals  Enc Vitals Group     BP 10/08/18 0937 111/75     Pulse Rate 10/08/18 0937 70     Resp 10/08/18 0937 16     Temp 10/08/18 0937 98.5 F (36.9 C)     Temp Source 10/08/18 0937 Oral     SpO2 10/08/18 0937 95 %     Weight 10/08/18 0935 90.7 kg (200 lb)     Height 10/08/18 0935 1.549 m (5' 1" )     Head Circumference --      Peak Flow --      Pain Score 10/08/18 0935 4     Pain Loc --      Pain Edu? --      Excl. in [place]? --     Constitutional: Alert and oriented. No acute distress. Pleasant and interactive  Nose: No congestion/rhinnorhea. Mouth/Throat: Mucous membranes are moist.   Neck:  Painless ROM Cardiovascular: Normal rate, regular rhythm. Grossly normal heart sounds.  Good peripheral circulation. Respiratory: Normal respiratory effort.  No retractions. Lungs CTAB. Gastrointestinal: Soft and nontender. No distention.  No CVA tenderness. Genitourinary: deferred Musculoskeletal: Left knee,  Steri-Strips in place, no evidence of infection, erythema or tenderness palpation, Neurologic:  Normal speech and language. No gross focal neurologic deficits are appreciated.  Skin:  Skin is warm, dry and intact. No rash noted. Psychiatric: Mood and affect are normal. Speech and behavior are normal.  ____________________________________________   LABS (all labs ordered are listed, but only abnormal results are displayed)  Labs Reviewed  CBC WITH DIFFERENTIAL/PLATELET - Abnormal; Notable for the following components:      Result Value   WBC 3.5 (*)    All other components within normal limits  COMPREHENSIVE METABOLIC PANEL - Abnormal; Notable for the following components:   Glucose, Bld 111 (*)    BUN 24 (*)    Creatinine, Ser 1.14 (*)    AST 45 (*)    GFR calc non Af Amer 49 (*)    GFR calc Af Amer 56 (*)    All other components within normal limits  URINALYSIS, COMPLETE (UACMP) WITH MICROSCOPIC - Abnormal; Notable for the following components:   Color, Urine YELLOW (*)    APPearance HAZY (*)    Leukocytes,Ua SMALL (*)    All other components within normal limits   ____________________________________________  EKG  None ____________________________________________  RADIOLOGY  X-ray ____________________________________________   PROCEDURES  Procedure(s) performed: No  Procedures   Critical Care performed: No ____________________________________________   INITIAL IMPRESSION / ASSESSMENT AND PLAN / ED COURSE  Pertinent labs & imaging results that were available during my care of the patient were reviewed by me and considered in my medical decision making (see chart for details).  Patient overall well-appearing and in no acute distress, vital signs are reassuring, she does appear mildly dehydrated on exam, will give IV fluids, check CBC, CMP, chest x-ray, urinalysis to make sure no evidence of electrolyte abnormalities that are contributing to her weakness.  I  suspect she would benefit significantly from home health and she is quite amenable to this idea, have consulted case management who will arrange this    ____________________________________________   FINAL CLINICAL IMPRESSION(S) / ED  DIAGNOSES  Final diagnoses:  Dehydration        Note:  This document was prepared using Dragon voice recognition software and may include unintentional dictation errors.   Lavonia Drafts, MD 10/08/18 [phone]

## 2018-10-08 NOTE — ED Notes (Signed)
Pt up to restroom with this RN to assist.

## 2018-10-08 NOTE — ED Triage Notes (Signed)
Pt from home via EMS with c/o generalized weakness xfew days. PT had mechanical fall yesterday, denies any LOC or head injury. PT A&OX4. Speech clear. Recent LFT knee surgery on 6/2

## 2018-10-08 NOTE — TOC Transition Note (Signed)
Transition of Care [facility]) - CM/SW Discharge Note   Patient Details  Name: Gwendolyn [name] MRN: [id number] Date of Birth: 12/04/1945  [facility] of [facility]) CM/SW Contact:  Marshell Garfinkel, RN Phone Number: 10/08/2018, 10:27 AM   Clinical Narrative:    [facility] received call from EDP requesting home PT for this patient returning to home today.  Friday 3PM with Dr. Roland Rack confirmed with [place]. Lovena Le with Dr. Roland Rack updated and she will update him. RNCM met with patient and she states she lives at home alone with her dog (a friend is caring for now).  She expects her son Lennette Bihari to provide transportation home today. She has a front-wheeled walker available at home and denies need for bedside commode or shower chair.  She did not have preference of home health agency- updated patient that Dr. Roland Rack prefers [facility] and she would like to use them.  Referral to [facility] with [facility] with contact for patient as [phone]. EDP updated.    Final next level of care: [place] Barriers to Discharge: No Barriers Identified   Patient Goals and CMS Choice Patient states their goals for this hospitalization and ongoing recovery are:: "Return to home with my dog" CMS Medicare.gov Compare Post Acute Care list provided to:: Patient Choice offered to / list presented to : Patient  Discharge Placement                       Discharge Plan and Services   Discharge Planning Services: CM Consult Post Acute Care Choice: [place]: PT [place]: [facility] (now [facility]) Date [place]: 10/08/18 Time [place]: [id number] Representative spoke with at [place]: [place] ([place]) Interventions     Readmission Risk Interventions No flowsheet data found.

## 2018-10-28 ENCOUNTER — Other Ambulatory Visit: Payer: Self-pay | Admitting: Surgery

## 2018-10-28 DIAGNOSIS — D2372 Other benign neoplasm of skin of left lower limb, including hip: Secondary | ICD-10-CM

## 2018-10-28 DIAGNOSIS — M1712 Unilateral primary osteoarthritis, left knee: Secondary | ICD-10-CM

## 2018-11-02 ENCOUNTER — Other Ambulatory Visit: Payer: Self-pay

## 2018-11-02 ENCOUNTER — Ambulatory Visit
Admission: RE | Admit: 2018-11-02 | Discharge: 2018-11-02 | Disposition: A | Discharge status: Home / Self Care | Payer: Medicare Other | Source: Ambulatory Visit | Attending: Surgery | Admitting: Surgery

## 2018-11-02 DIAGNOSIS — M1712 Unilateral primary osteoarthritis, left knee: Secondary | ICD-10-CM | POA: Insufficient documentation

## 2018-11-02 DIAGNOSIS — D2372 Other benign neoplasm of skin of left lower limb, including hip: Secondary | ICD-10-CM | POA: Insufficient documentation

## 2018-12-30 ENCOUNTER — Other Ambulatory Visit: Payer: Self-pay | Admitting: Surgery

## 2018-12-30 DIAGNOSIS — S83232D Complex tear of medial meniscus, current injury, left knee, subsequent encounter: Secondary | ICD-10-CM

## 2018-12-30 DIAGNOSIS — M84469S Pathological fracture, unspecified tibia and fibula, sequela: Secondary | ICD-10-CM

## 2018-12-30 DIAGNOSIS — M1712 Unilateral primary osteoarthritis, left knee: Secondary | ICD-10-CM

## 2019-01-09 ENCOUNTER — Ambulatory Visit
Admission: RE | Admit: 2019-01-09 | Discharge: 2019-01-09 | Disposition: A | Discharge status: Home / Self Care | Payer: Medicare Other | Source: Ambulatory Visit | Attending: Surgery | Admitting: Surgery

## 2019-01-09 ENCOUNTER — Other Ambulatory Visit: Payer: Self-pay

## 2019-01-09 DIAGNOSIS — M84469S Pathological fracture, unspecified tibia and fibula, sequela: Secondary | ICD-10-CM | POA: Diagnosis present

## 2019-01-09 DIAGNOSIS — M1712 Unilateral primary osteoarthritis, left knee: Secondary | ICD-10-CM

## 2019-01-09 DIAGNOSIS — S83232D Complex tear of medial meniscus, current injury, left knee, subsequent encounter: Secondary | ICD-10-CM | POA: Diagnosis present

## 2019-03-13 IMAGING — US US RENAL
1 series · 14 of 25 positions shown · non-contrast
Comparison: None

CLINICAL DATA: Acute renal failure, history fibromyalgia,
hypertension, GERD, NASH

EXAM:
RENAL / URINARY TRACT ULTRASOUND COMPLETE

[Series 1: us renal · 0.26mm/px · 14 of 34 slices shown]
[im 1/34]
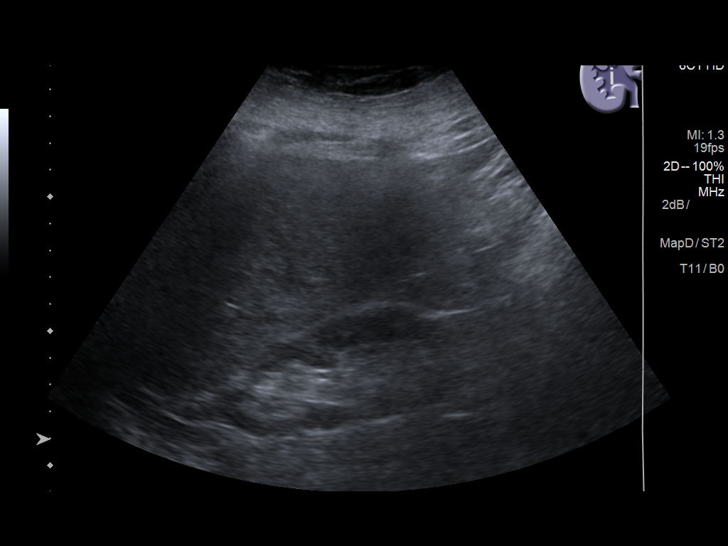
[im 3/34]
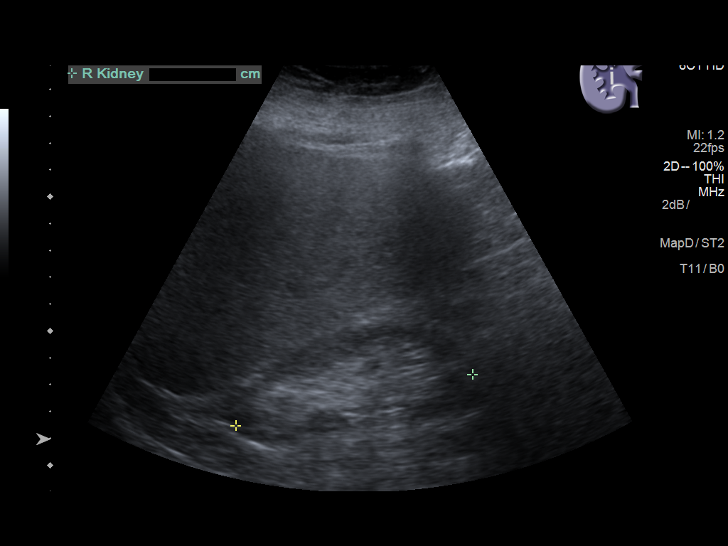
[im 6/34]
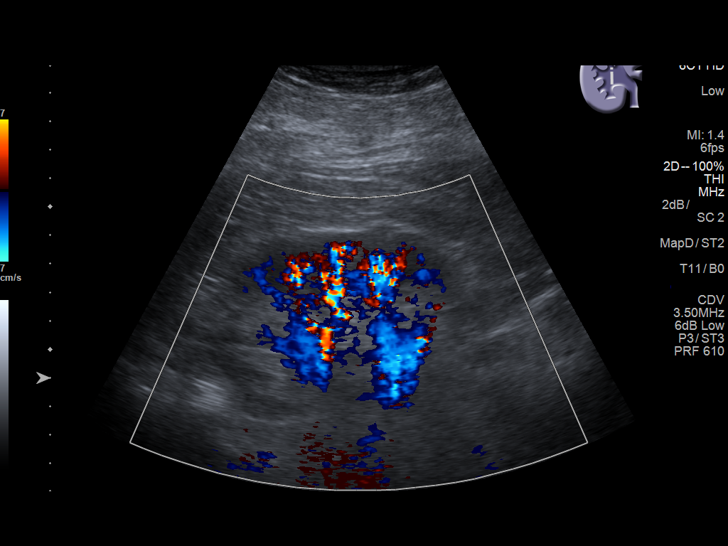
[im 9/34]
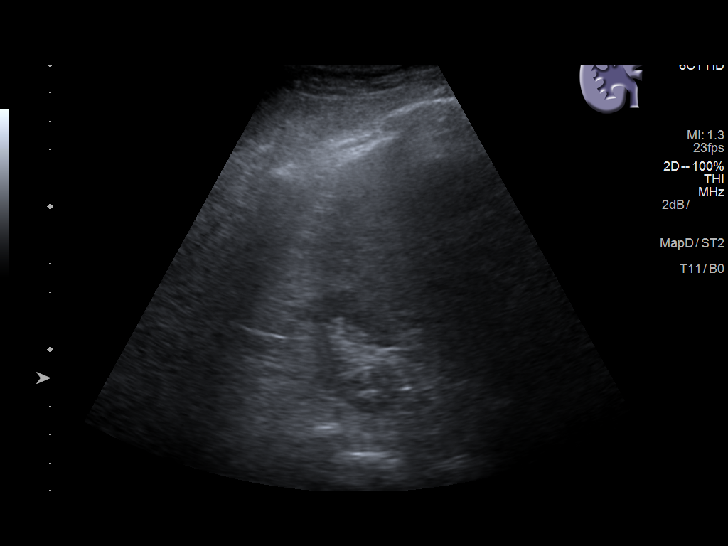
[im 12/34]
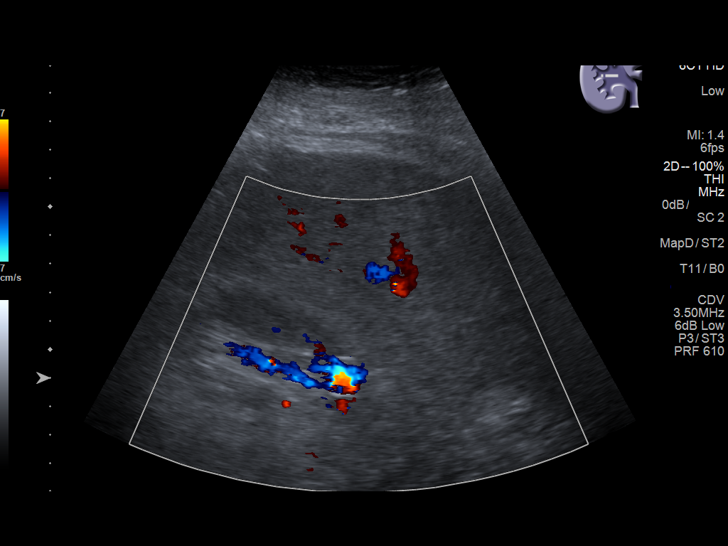
[im 13/34]
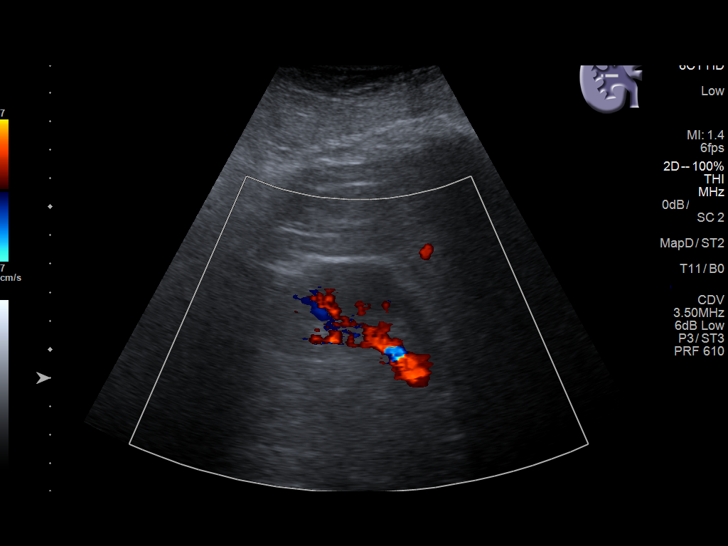
[im 16/34]
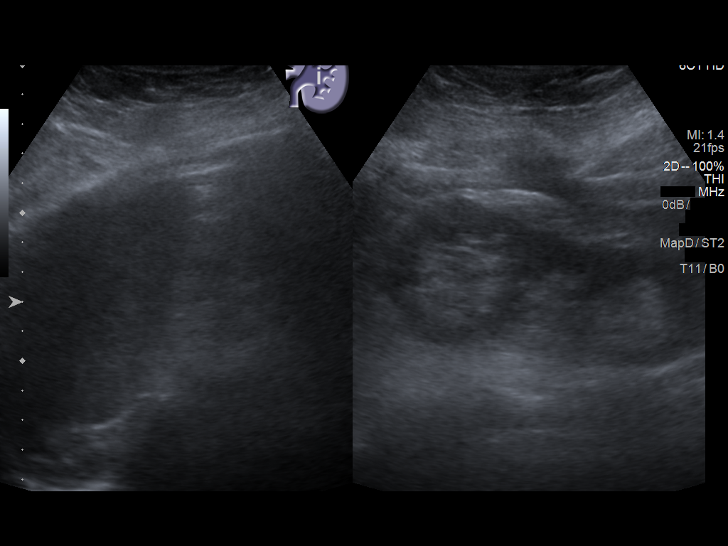
[im 18/34]
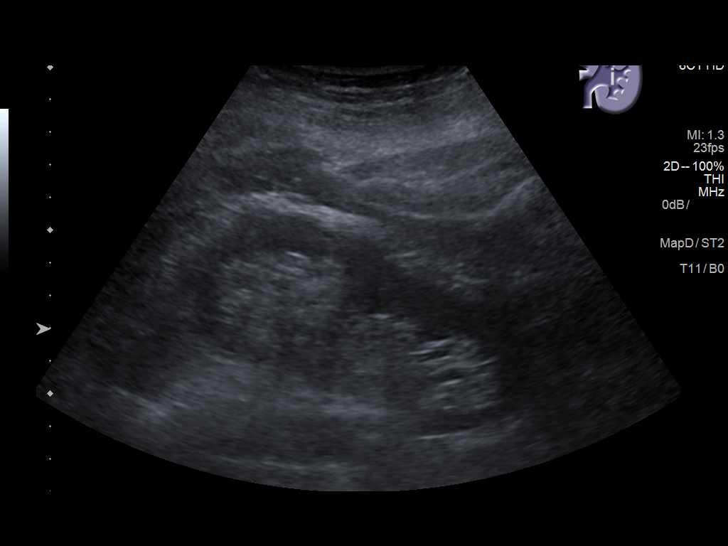
[im 21/34]
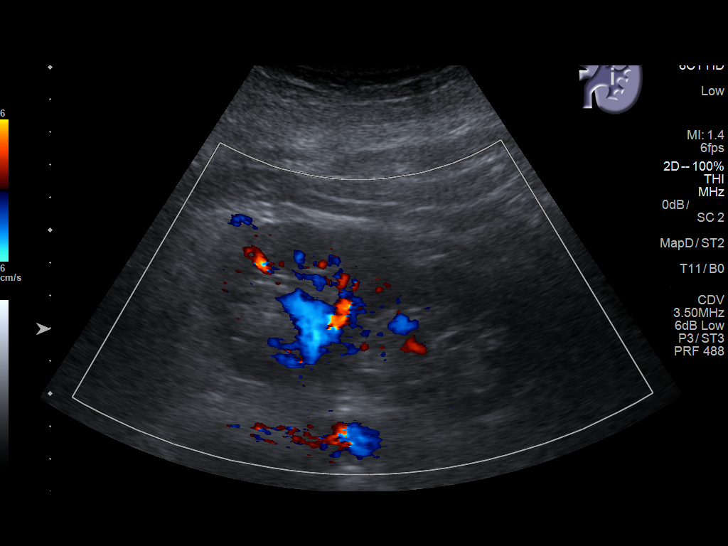
[im 23/34]
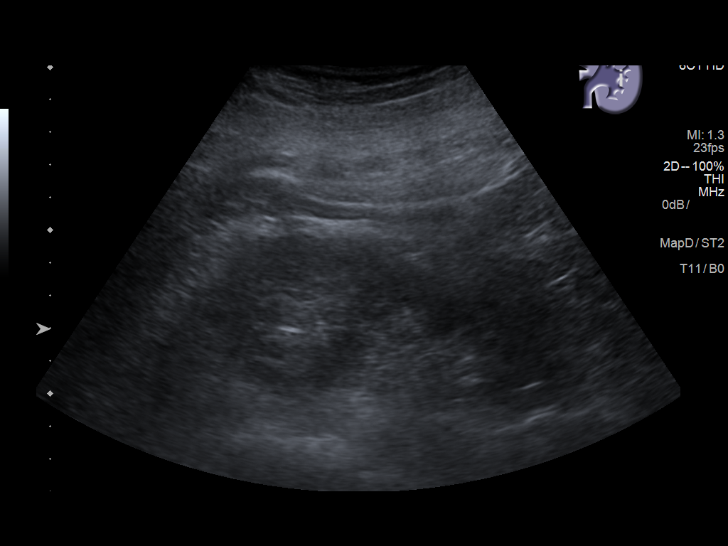
[im 25/34]
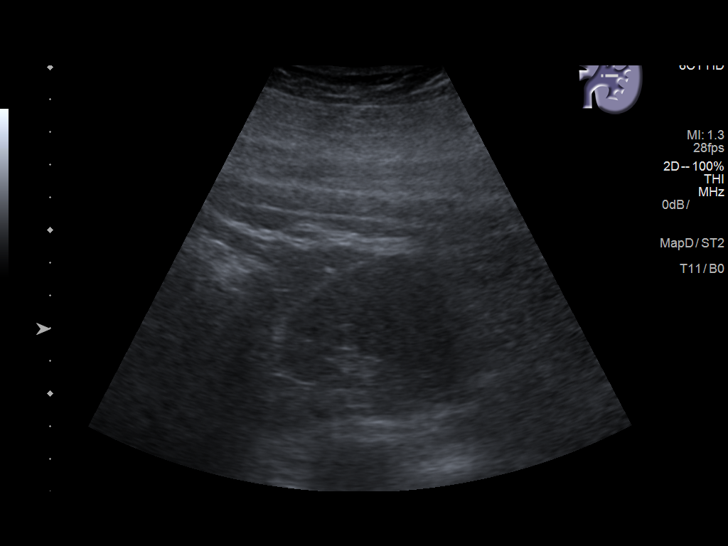
[im 28/34]
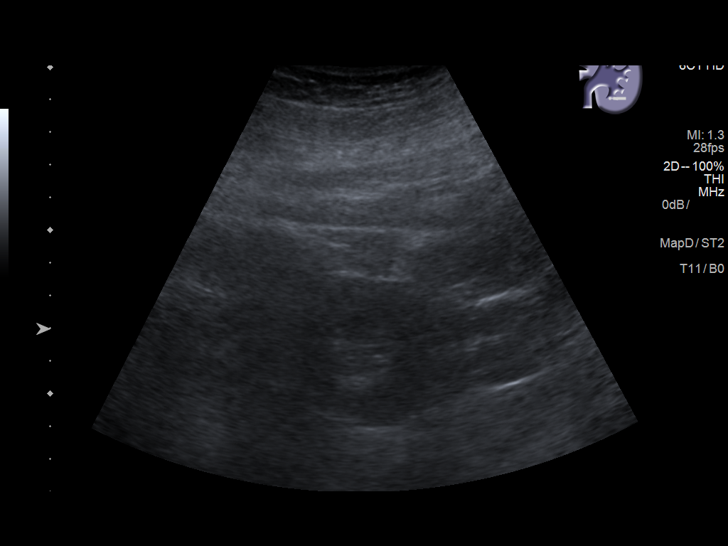
[im 31/34]
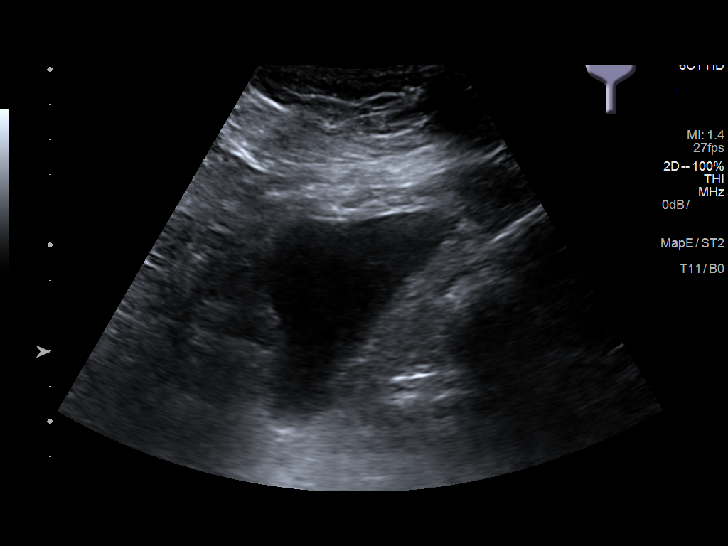
[im 34/34]
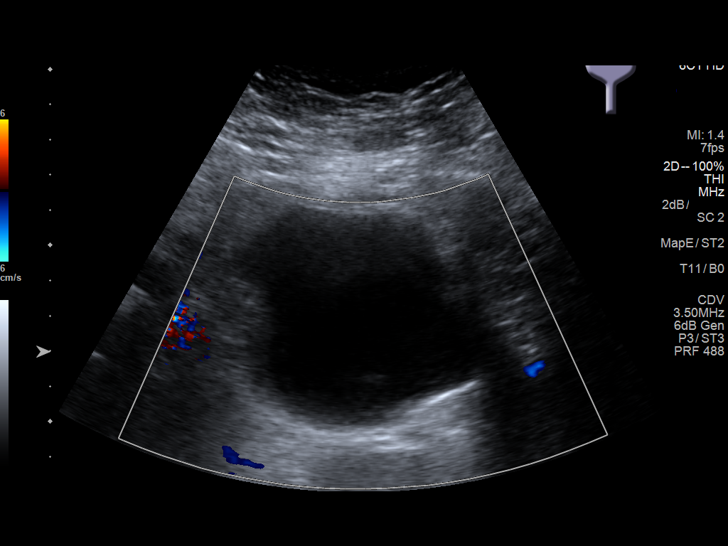

[14 of 25 positions shown; findings below may reference images not displayed]

FINDINGS: Right Kidney:

Length: 10.1 cm. Cortical thinning. Upper normal cortical
echogenicity. No mass, hydronephrosis or shadowing calcification.

Left Kidney:

Length: 10.7 cm. Cortical thinning. Normal cortical echogenicity. No
mass, hydronephrosis or shadowing calcification.

Bladder:

Unremarkable
IMPRESSION: BILATERAL renal cortical atrophy.

Otherwise negative exam.

## 2019-05-07 ENCOUNTER — Ambulatory Visit: Payer: Medicare Other | Attending: Internal Medicine

## 2019-05-07 DIAGNOSIS — Z23 Encounter for immunization: Secondary | ICD-10-CM | POA: Insufficient documentation

## 2019-05-07 NOTE — Progress Notes (Signed)
   Covid-19 Vaccination Clinic  Name:  Gwendolyn [name]    MRN: [id number] DOB: 1946-02-12  05/07/2019  [name] was observed post Covid-19 immunization for 15 minutes without incidence. She was provided with Vaccine Information Sheet and instruction to access the V-Safe system.   [name] was instructed to call 911 with any severe reactions post vaccine: Marland Kitchen Difficulty breathing  . Swelling of your face and throat  . A fast heartbeat  . A bad rash all over your body  . Dizziness and weakness    Immunizations Administered    Name Date Dose VIS Date Route   Pfizer COVID-19 Vaccine 05/07/2019  9:27 AM 0.3 mL 03/28/2019 Intramuscular   Manufacturer: [place]   Lot: [id number]   [place]: [id number]

## 2019-05-28 ENCOUNTER — Ambulatory Visit: Payer: Medicare Other | Attending: Internal Medicine

## 2019-05-28 DIAGNOSIS — Z23 Encounter for immunization: Secondary | ICD-10-CM | POA: Insufficient documentation

## 2019-05-28 NOTE — Progress Notes (Signed)
   Covid-19 Vaccination Clinic  Name:  ALDEEN RIGA    MRN: [id number] DOB: [birth year]  05/28/2019  Ms. Strausser was observed post Covid-19 immunization for 15 minutes without incidence. She was provided with Vaccine Information Sheet and instruction to access the V-Safe system.   Ms. Blackard was instructed to call 911 with any severe reactions post vaccine: Marland Kitchen Difficulty breathing  . Swelling of your face and throat  . A fast heartbeat  . A bad rash all over your body  . Dizziness and weakness    Immunizations Administered    Name Date Dose VIS Date Route   Pfizer COVID-19 Vaccine 05/28/2019  8:39 AM 0.3 mL 03/28/2019 Intramuscular   Manufacturer: [place]   Lot: [id number]   [place]: [id number]

## 2019-07-17 ENCOUNTER — Emergency Department: Payer: Medicare Other

## 2019-07-17 ENCOUNTER — Inpatient Hospital Stay: Payer: Medicare Other

## 2019-07-17 ENCOUNTER — Other Ambulatory Visit: Payer: Self-pay

## 2019-07-17 ENCOUNTER — Inpatient Hospital Stay
Admission: EM | Admit: 2019-07-17 | Discharge: 2019-07-19 | DRG: 092 | Disposition: A | Discharge status: Home / Self Care | Payer: Medicare Other | Attending: Hospitalist | Admitting: Hospitalist

## 2019-07-17 DIAGNOSIS — Y92009 Unspecified place in unspecified non-institutional (private) residence as the place of occurrence of the external cause: Secondary | ICD-10-CM

## 2019-07-17 DIAGNOSIS — K219 Gastro-esophageal reflux disease without esophagitis: Secondary | ICD-10-CM | POA: Diagnosis present

## 2019-07-17 DIAGNOSIS — G8929 Other chronic pain: Secondary | ICD-10-CM | POA: Diagnosis present

## 2019-07-17 DIAGNOSIS — Z8261 Family history of arthritis: Secondary | ICD-10-CM

## 2019-07-17 DIAGNOSIS — E860 Dehydration: Secondary | ICD-10-CM | POA: Diagnosis present

## 2019-07-17 DIAGNOSIS — M797 Fibromyalgia: Secondary | ICD-10-CM | POA: Diagnosis present

## 2019-07-17 DIAGNOSIS — F329 Major depressive disorder, single episode, unspecified: Secondary | ICD-10-CM | POA: Diagnosis present

## 2019-07-17 DIAGNOSIS — I959 Hypotension, unspecified: Secondary | ICD-10-CM | POA: Diagnosis not present

## 2019-07-17 DIAGNOSIS — Z79899 Other long term (current) drug therapy: Secondary | ICD-10-CM

## 2019-07-17 DIAGNOSIS — Z79891 Long term (current) use of opiate analgesic: Secondary | ICD-10-CM | POA: Diagnosis not present

## 2019-07-17 DIAGNOSIS — K7460 Unspecified cirrhosis of liver: Secondary | ICD-10-CM | POA: Diagnosis present

## 2019-07-17 DIAGNOSIS — Z20822 Contact with and (suspected) exposure to covid-19: Secondary | ICD-10-CM | POA: Diagnosis present

## 2019-07-17 DIAGNOSIS — G92 Toxic encephalopathy: Principal | ICD-10-CM | POA: Diagnosis present

## 2019-07-17 DIAGNOSIS — Z9012 Acquired absence of left breast and nipple: Secondary | ICD-10-CM

## 2019-07-17 DIAGNOSIS — I10 Essential (primary) hypertension: Secondary | ICD-10-CM | POA: Diagnosis present

## 2019-07-17 DIAGNOSIS — M1990 Unspecified osteoarthritis, unspecified site: Secondary | ICD-10-CM | POA: Diagnosis present

## 2019-07-17 DIAGNOSIS — R8290 Unspecified abnormal findings in urine: Secondary | ICD-10-CM | POA: Diagnosis present

## 2019-07-17 DIAGNOSIS — N179 Acute kidney failure, unspecified: Secondary | ICD-10-CM | POA: Diagnosis present

## 2019-07-17 DIAGNOSIS — I679 Cerebrovascular disease, unspecified: Secondary | ICD-10-CM

## 2019-07-17 DIAGNOSIS — E039 Hypothyroidism, unspecified: Secondary | ICD-10-CM | POA: Diagnosis present

## 2019-07-17 DIAGNOSIS — R441 Visual hallucinations: Secondary | ICD-10-CM | POA: Diagnosis present

## 2019-07-17 DIAGNOSIS — J45909 Unspecified asthma, uncomplicated: Secondary | ICD-10-CM | POA: Diagnosis present

## 2019-07-17 DIAGNOSIS — R400 Somnolence: Secondary | ICD-10-CM | POA: Diagnosis present

## 2019-07-17 DIAGNOSIS — Z79890 Hormone replacement therapy (postmenopausal): Secondary | ICD-10-CM | POA: Diagnosis not present

## 2019-07-17 DIAGNOSIS — R188 Other ascites: Secondary | ICD-10-CM

## 2019-07-17 DIAGNOSIS — G9341 Metabolic encephalopathy: Secondary | ICD-10-CM | POA: Diagnosis present

## 2019-07-17 DIAGNOSIS — T40495A Adverse effect of other synthetic narcotics, initial encounter: Secondary | ICD-10-CM | POA: Diagnosis present

## 2019-07-17 DIAGNOSIS — Z91048 Other nonmedicinal substance allergy status: Secondary | ICD-10-CM

## 2019-07-17 DIAGNOSIS — R410 Disorientation, unspecified: Secondary | ICD-10-CM

## 2019-07-17 DIAGNOSIS — Z853 Personal history of malignant neoplasm of breast: Secondary | ICD-10-CM

## 2019-07-17 DIAGNOSIS — Z885 Allergy status to narcotic agent status: Secondary | ICD-10-CM

## 2019-07-17 DIAGNOSIS — F32A Depression, unspecified: Secondary | ICD-10-CM | POA: Diagnosis present

## 2019-07-17 DIAGNOSIS — G2581 Restless legs syndrome: Secondary | ICD-10-CM | POA: Diagnosis present

## 2019-07-17 DIAGNOSIS — Z825 Family history of asthma and other chronic lower respiratory diseases: Secondary | ICD-10-CM

## 2019-07-17 DIAGNOSIS — E785 Hyperlipidemia, unspecified: Secondary | ICD-10-CM | POA: Diagnosis present

## 2019-07-17 DIAGNOSIS — M545 Low back pain: Secondary | ICD-10-CM | POA: Diagnosis present

## 2019-07-17 DIAGNOSIS — Z803 Family history of malignant neoplasm of breast: Secondary | ICD-10-CM

## 2019-07-17 LAB — CBC
HCT: 41 % (ref 36.0–46.0)
Hemoglobin: 13.2 g/dL (ref 12.0–15.0)
MCH: 30.8 pg (ref 26.0–34.0)
MCHC: 32.2 g/dL (ref 30.0–36.0)
MCV: 95.8 fL (ref 80.0–100.0)
Platelets: 183 10*3/uL (ref 150–400)
RBC: 4.28 MIL/uL (ref 3.87–5.11)
RDW: 14.5 % (ref 11.5–15.5)
WBC: 7.9 10*3/uL (ref 4.0–10.5)
nRBC: 0 % (ref 0.0–0.2)

## 2019-07-17 LAB — DIFFERENTIAL
Abs Immature Granulocytes: 0.02 10*3/uL (ref 0.00–0.07)
Basophils Absolute: 0 10*3/uL (ref 0.0–0.1)
Basophils Relative: 0 %
Eosinophils Absolute: 0 10*3/uL (ref 0.0–0.5)
Eosinophils Relative: 0 %
Immature Granulocytes: 0 %
Lymphocytes Relative: 14 %
Lymphs Abs: 1.1 10*3/uL (ref 0.7–4.0)
Monocytes Absolute: 0.6 10*3/uL (ref 0.1–1.0)
Monocytes Relative: 7 %
Neutro Abs: 6.2 10*3/uL (ref 1.7–7.7)
Neutrophils Relative %: 79 %

## 2019-07-17 LAB — APTT: aPTT: 35 seconds (ref 24–36)

## 2019-07-17 LAB — URINALYSIS, COMPLETE (UACMP) WITH MICROSCOPIC
Bilirubin Urine: NEGATIVE
Glucose, UA: NEGATIVE mg/dL
Ketones, ur: NEGATIVE mg/dL
Nitrite: NEGATIVE
Protein, ur: 30 mg/dL — AB
Specific Gravity, Urine: 1.011 (ref 1.005–1.030)
pH: 5 (ref 5.0–8.0)

## 2019-07-17 LAB — PROTIME-INR
INR: 0.9 (ref 0.8–1.2)
Prothrombin Time: 12.3 seconds (ref 11.4–15.2)

## 2019-07-17 LAB — COMPREHENSIVE METABOLIC PANEL
ALT: 44 U/L (ref 0–44)
AST: 85 U/L — ABNORMAL HIGH (ref 15–41)
Albumin: 3.7 g/dL (ref 3.5–5.0)
Alkaline Phosphatase: 58 U/L (ref 38–126)
Anion gap: 15 (ref 5–15)
BUN: 60 mg/dL — ABNORMAL HIGH (ref 8–23)
CO2: 19 mmol/L — ABNORMAL LOW (ref 22–32)
Calcium: 9 mg/dL (ref 8.9–10.3)
Chloride: 102 mmol/L (ref 98–111)
Creatinine, Ser: 4.21 mg/dL — ABNORMAL HIGH (ref 0.44–1.00)
GFR calc Af Amer: 12 mL/min — ABNORMAL LOW (ref >60)
GFR calc non Af Amer: 10 mL/min — ABNORMAL LOW (ref >60)
Glucose, Bld: 102 mg/dL — ABNORMAL HIGH (ref 70–99)
Potassium: 5 mmol/L (ref 3.5–5.1)
Sodium: 136 mmol/L (ref 135–145)
Total Bilirubin: 1 mg/dL (ref 0.3–1.2)
Total Protein: 7.3 g/dL (ref 6.5–8.1)

## 2019-07-17 LAB — CREATININE, URINE, RANDOM: Creatinine, Urine: 144 mg/dL

## 2019-07-17 LAB — CREATININE, SERUM
Creatinine, Ser: 3.98 mg/dL — ABNORMAL HIGH (ref 0.44–1.00)
GFR calc Af Amer: 12 mL/min — ABNORMAL LOW (ref >60)
GFR calc non Af Amer: 11 mL/min — ABNORMAL LOW (ref >60)

## 2019-07-17 LAB — AMMONIA: Ammonia: 15 umol/L (ref 9–35)

## 2019-07-17 LAB — SODIUM, URINE, RANDOM: Sodium, Ur: 32 mmol/L

## 2019-07-17 MED ORDER — SODIUM CHLORIDE 0.9% FLUSH
3.0000 mL | Freq: Once | INTRAVENOUS | Status: AC
Start: 2019-07-17 — End: 2019-07-18
  Administered 2019-07-18: 3 mL via INTRAVENOUS

## 2019-07-17 MED ORDER — SODIUM CHLORIDE 0.9 % IV SOLN
1.0000 g | INTRAVENOUS | Status: DC
  Administered 2019-07-17: 1 g via INTRAVENOUS
  Filled 2019-07-17 (×2): qty 10

## 2019-07-17 MED ORDER — SODIUM CHLORIDE 0.9 % IV BOLUS
1000.0000 mL | Freq: Once | INTRAVENOUS | Status: AC
  Administered 2019-07-17: 1000 mL via INTRAVENOUS

## 2019-07-17 MED ORDER — HEPARIN SODIUM (PORCINE) 5000 UNIT/ML IJ SOLN
5000.0000 [IU] | Freq: Three times a day (TID) | INTRAMUSCULAR | Status: DC
  Administered 2019-07-17 – 2019-07-19 (×5): 5000 [IU] via SUBCUTANEOUS
  Filled 2019-07-17 (×5): qty 1

## 2019-07-17 MED ORDER — LEVOTHYROXINE SODIUM 50 MCG PO TABS
50.0000 ug | ORAL_TABLET | Freq: Every day | ORAL | Status: DC
  Administered 2019-07-18 – 2019-07-19 (×2): 50 ug via ORAL
  Filled 2019-07-17 (×2): qty 1

## 2019-07-17 MED ORDER — FLUTICASONE PROPIONATE HFA 110 MCG/ACT IN AERO
1.0000 | INHALATION_SPRAY | Freq: Two times a day (BID) | RESPIRATORY_TRACT | Status: DC | PRN
  Filled 2019-07-17: qty 12

## 2019-07-17 MED ORDER — ROPINIROLE HCL 1 MG PO TABS
2.0000 mg | ORAL_TABLET | Freq: Three times a day (TID) | ORAL | Status: DC
  Administered 2019-07-17 – 2019-07-19 (×5): 2 mg via ORAL
  Filled 2019-07-17 (×6): qty 2

## 2019-07-17 MED ORDER — FLUTICASONE PROPIONATE 50 MCG/ACT NA SUSP
1.0000 | Freq: Two times a day (BID) | NASAL | Status: DC | PRN
  Filled 2019-07-17: qty 16

## 2019-07-17 MED ORDER — GABAPENTIN 300 MG PO CAPS
600.0000 mg | ORAL_CAPSULE | Freq: Three times a day (TID) | ORAL | Status: DC
  Administered 2019-07-17 – 2019-07-19 (×5): 600 mg via ORAL
  Filled 2019-07-17 (×5): qty 2

## 2019-07-17 MED ORDER — ESCITALOPRAM OXALATE 10 MG PO TABS
20.0000 mg | ORAL_TABLET | Freq: Every day | ORAL | Status: DC
  Administered 2019-07-18 – 2019-07-19 (×2): 20 mg via ORAL
  Filled 2019-07-17 (×2): qty 2

## 2019-07-17 MED ORDER — BUPRENORPHINE HCL-NALOXONE HCL 2-0.5 MG SL SUBL
1.0000 | SUBLINGUAL_TABLET | Freq: Every day | SUBLINGUAL | Status: DC
  Administered 2019-07-18: 1 via SUBLINGUAL
  Filled 2019-07-17: qty 2

## 2019-07-17 MED ORDER — ALBUTEROL SULFATE HFA 108 (90 BASE) MCG/ACT IN AERS
2.0000 | INHALATION_SPRAY | Freq: Four times a day (QID) | RESPIRATORY_TRACT | Status: DC | PRN
  Filled 2019-07-17: qty 6.7

## 2019-07-17 MED ORDER — SODIUM CHLORIDE 0.9 % IV SOLN: Freq: Once | INTRAVENOUS | Status: AC

## 2019-07-17 MED ORDER — SODIUM CHLORIDE 0.9 % IV SOLN: INTRAVENOUS | Status: DC

## 2019-07-17 MED ORDER — PANTOPRAZOLE SODIUM 40 MG PO TBEC
40.0000 mg | DELAYED_RELEASE_TABLET | Freq: Every day | ORAL | Status: DC
  Administered 2019-07-18 – 2019-07-19 (×2): 40 mg via ORAL
  Filled 2019-07-17 (×2): qty 1

## 2019-07-17 MED ORDER — ATORVASTATIN CALCIUM 20 MG PO TABS
40.0000 mg | ORAL_TABLET | Freq: Every day | ORAL | Status: DC
  Administered 2019-07-17 – 2019-07-18 (×2): 40 mg via ORAL
  Filled 2019-07-17 (×2): qty 2

## 2019-07-17 NOTE — ED Provider Notes (Signed)
[facility] Emergency Department Provider Note  ____________________________________________   First MD Initiated Contact with Patient 07/17/19 1233     (approximate)  I have reviewed the triage vital signs and the nursing notes.  History  Chief Complaint Possible stroke    HPI Gwendolyn [name] is a 74 y.o. 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  Patient states for at least the last several days and feels like she has been more "foggy headed" and confused, she is self aware of this.  She states she has been hallucinating that her dog that recently passed away is running around her house.  She is aware that this is abnormal.  She denies any other recent illnesses aside from a few episodes of diarrhea that occurred several days ago and has since resolved.  She denies any vomiting.  No fevers, chest pain, difficulty breathing, cough.  Denies any urinary symptoms.  She did recently have a medication route change a few days ago, she takes buprenorphine which was recently switched from a transdermal patch to a sublingual tablet form. Was on transdermal patch at 20 mcg/hour and changed to sublingual 2 mg BID. No other changes to medications or new medications.   Past Medical Hx Past Medical History:  Diagnosis Date  . Anxiety   . Arthritis   . Asthma   . Breast cancer ([place]) 01/2010   left mastectomy, LN neg, on femara  . Chronic low back pain   . Cirrhosis of liver ([place])   . Depression   . Fibromyalgia   . GERD (gastroesophageal reflux disease)   . History of chicken pox   . HTN (hypertension)   . Hyperlipidemia   . Hypothyroidism   . IBS (irritable bowel syndrome)   . Migraines   . NASH (nonalcoholic steatohepatitis)    [facility] by [facility] Gerald Dexter at [facility]  . PONV (postoperative nausea and vomiting)   . RLS (restless legs  syndrome)   . Seasonal allergies   . Trigeminal neuralgia     Problem List Patient Active Problem List   Diagnosis Date Noted  . ARF (acute renal failure) ([place]) 01/09/2018  . Chronic venous insufficiency 11/28/2017  . Lymphedema 11/28/2017  . Leg pain 11/28/2017  . GERD (gastroesophageal reflux disease) 11/28/2017  . Unspecified hypothyroidism 12/10/2013  . Medicare annual wellness visit, subsequent 07/08/2013  . Weakness of both legs 07/08/2013  . Night sweats 01/16/2013  . Tremor 01/16/2013  . Palpitations 01/16/2013  . Fibromyalgia 07/22/2012  . Essential hypertension, benign 06/19/2012  . Bereavement 06/19/2012  . Depression 03/28/2012  . Insomnia 03/28/2012  . Lumbar back pain 12/22/2011  . HX: breast cancer 09/18/2011  . Abnormal CT lung screening 08/07/2011  . NASH (nonalcoholic steatohepatitis) 06/12/2011    Past Surgical Hx Past Surgical History:  Procedure Laterality Date  . ABDOMINAL HYSTERECTOMY    . ANKLE SURGERY  2006  . BREAST BIOPSY  2011   [place]  . CHOLECYSTECTOMY  2005  . CYSTOCELE REPAIR    . DILATION AND CURETTAGE OF UTERUS    . GASTRIC BYPASS  2008  . KNEE ARTHROSCOPY Left 09/17/2018   Procedure: KNEE ARTHROSCOPY WITH DEBRIDEMENT, PARTIAL MEDIAL MENISCECTOMY AND SUB-CHONDROPLASTY OF A BONE MARROW LESION INVOLVING THE MEIDAL TIBIAL PLATEAU;  Surgeon: Corky Mull, MD;  Location: [facility] ORS;  Service: Orthopedics;  Laterality: Left;  Marland Kitchen MASTECTOMY  2011  Breast Cancer  . RECTOCELE REPAIR    . ROTATOR CUFF REPAIR  2001  . SEPTOPLASTY    . TONSILLECTOMY  age 30  . TOTAL ABDOMINAL HYSTERECTOMY W/ BILATERAL SALPINGOOPHORECTOMY  1991  . VAGINAL DELIVERY     x3    Medications Prior to Admission medications   Medication Sig Start Date End Date Taking? Authorizing Provider  acetaminophen (TYLENOL) 500 MG tablet Take 1,000 mg by mouth every 6 (six) hours as needed for moderate pain or headache.    [provider]  albuterol (PROVENTIL HFA;VENTOLIN  HFA) 108 (90 Base) MCG/ACT inhaler Inhale 2 puffs into the lungs every 6 (six) hours as needed for wheezing or shortness of breath.    [provider]  atorvastatin (LIPITOR) 40 MG tablet Take 40 mg by mouth at bedtime.     [provider]  baclofen (LIORESAL) 10 MG tablet Take 10-20 mg by mouth See admin instructions. Take 10 mg in the morning and 20 mg at night    [provider]  Buprenorphine (BUTRANS) 15 MCG/HR PTWK Place 15 mcg onto the skin every Wednesday.     [provider]  calcium carbonate (OS-CAL - DOSED IN MG OF ELEMENTAL CALCIUM) 1250 (500 Ca) MG tablet Take 1 tablet by mouth daily.    [provider]  Cholecalciferol (VITAMIN D) 50 MCG (2000 UT) tablet Take 2,000 Units by mouth daily.    [provider]  enalapril (VASOTEC) 10 MG tablet Take 10 mg by mouth daily.    [provider]  escitalopram (LEXAPRO) 20 MG tablet Take 20 mg by mouth daily.  07/19/17   [provider]  fluticasone (FLONASE) 50 MCG/ACT nasal spray Place 1 spray into both nostrils 2 (two) times daily as needed for allergies.     [provider]  fluticasone (FLOVENT HFA) 110 MCG/ACT inhaler Inhale 1 puff into the lungs 2 (two) times daily as needed (shortness of breath).     [provider]  gabapentin (NEURONTIN) 300 MG capsule Take 300-600 mg by mouth See admin instructions. Take 300 mg in the morning and 600 mg at night    [provider]  ibandronate (BONIVA) 150 MG tablet Take 150 mg by mouth every 30 (thirty) days.     [provider]  levothyroxine (SYNTHROID, LEVOTHROID) 50 MCG tablet Take 1 tablet (50 mcg total) by mouth daily. 07/08/13   Jackolyn Confer, MD  metoprolol succinate (TOPROL-XL) 25 MG 24 hr tablet Take 1 tablet (25 mg total) by mouth daily. 07/08/13   Jackolyn Confer, MD  Multiple Vitamins-Minerals (MULTIVITAMIN WITH MINERALS) tablet Take 1 tablet by mouth daily.    [provider]  omeprazole (PRILOSEC) 20 MG capsule Take 20 mg by mouth daily.     [provider]  oxybutynin (DITROPAN-XL) 5 MG 24 hr tablet Take 5 mg by mouth at bedtime.     [provider]  pantoprazole (PROTONIX) 40 MG tablet Take 1 tablet (40 mg total) by mouth daily. 07/08/13   Jackolyn Confer, MD  rOPINIRole (REQUIP) 2 MG tablet Take 1 tablet (2 mg total) by mouth 2 (two) times daily. Patient taking differently: Take 2-4 mg by mouth See admin instructions. Take 2 mg in the morning and 4 mg at night 08/07/13   Jackolyn Confer, MD  VITAMIN E PO Take 1 tablet by mouth daily.    [provider]    Allergies Codeine, Oxycodone, and Silver  Family Hx  Family History  Problem Relation Age of Onset  . COPD Mother   . Alzheimer's disease Father   . Cancer Sister 29       Breast  . Arthritis Maternal Grandmother   . Arthritis Maternal Grandfather     Social Hx Social History   Tobacco Use  . Smoking status: Never Smoker  . Smokeless tobacco: Never Used  Substance Use Topics  . Alcohol use: Not Currently    Comment: Occasional  . Drug use: No     Review of Systems  Constitutional: Negative for fever. Negative for chills. Eyes: Negative for visual changes. ENT: Negative for sore throat. Cardiovascular: Negative for chest pain. Respiratory: Negative for shortness of breath. Gastrointestinal: Negative for nausea. Negative for vomiting.  Genitourinary: Negative for dysuria. Musculoskeletal: Negative for leg swelling. Skin: Negative for rash. Neurological: Negative for headaches. + confusion, AMS   Physical Exam  Vital Signs: ED Triage Vitals  Enc Vitals Group     BP 07/17/19 1118 (!) 78/49     Pulse Rate 07/17/19 1118 82     Resp 07/17/19 1118 14     Temp 07/17/19 1118 98 F (36.7 C)     Temp Source 07/17/19 1118 Oral     SpO2 07/17/19 1118 95 %     Weight 07/17/19 1126 219 lb (99.3 kg)     Height 07/17/19 1126 5' 3"  (1.6 m)     Head  Circumference --      Peak Flow --      Pain Score 07/17/19 1126 7     Pain Loc --      Pain Edu? --      Excl. in [place]? --     Constitutional: Alert and oriented. NAD. Does forget that she has already told pieces of the history, can't recall which details she has already provided. Head: Normocephalic. Atraumatic. Eyes: Conjunctivae clear. Sclera anicteric. Pupils equal and symmetric. Nose: No masses or lesions. No congestion or rhinorrhea. Mouth/Throat: Wearing mask.  Neck: No stridor. Trachea midline.  Cardiovascular: Normal rate, regular rhythm. Extremities well perfused. Respiratory: Normal respiratory effort.  Lungs CTAB. Gastrointestinal: Soft. Non-distended. Non-tender.  Genitourinary: Deferred. Musculoskeletal: No lower extremity edema. No deformities. Neurologic:  Normal speech and language. Very slightly decreased grip strength on LEFT compared to right (4+/5 v 5/5). Very slight decreased hip flexor strength on LEFT v right (4+/5 v 5/5). Otherwise full and equal strength throughout. Face symmetric, no dysarthria, CN II-XII in tact. AO x 3, though does forget that she has already told pieces of the history, can't recall which details she has already provided. Skin: Skin is warm, dry and intact. No rash noted. Psychiatric: Mood and affect are appropriate for situation.  EKG  Personally reviewed and interpreted by myself.   Date: 07/17/19 Time: 11:35 Rate: 84 Rhythm: sinus Axis: left Intervals: WNL No STEMI    Radiology  CXR  IMPRESSION:  Negative.   CT head  IMPRESSION:  1. Age related cerebral atrophy.  2. Bilateral lacunar type basal ganglia infarcts. The larger area on  the right is quite hypodense and likely remote. The 2 smaller areas  on the left are indeterminate.  3. No findings for hemispheric infarction or intracranial  hemorrhage.   Procedures  Procedure(s) performed (including critical care):  Procedures   Initial Impression / Assessment and  Plan / MDM / ED Course  74 y.o. female who presents to the ED for increased confusion x several days. Recently had her buprenorphine changed.  Ddx: medication side effect w/ recent route change, AKI, dehydration, elevated ammonia, UTI or other infection, CVA or other intracranial  Will plan for labs, imaging, fluids  Clinical Course as of Jul 17 1547  Thu Jul 17, 2019  1255 Labs reveal BUN 60, creatinine 4.21 from 24 and 1.14 previously.  This certainly could be the etiology of some of her confusion.  Will initiate fluids.  In the setting of her cirrhosis, also added on ammonia.  No obvious acute findings on CT head, MRI brain ordered for follow-up.   [SM]  1547 Normal ammonia. Will admit for AKI, uremia, confusion. Would also recommend further investigation into her buprenorphine dosing equivalents based on route change as additional etiology. MRI pending.   [SM]    Clinical Course User Index [SM] Lilia Pro., MD     _______________________________   As part of my medical decision making I have reviewed available labs, radiology tests, reviewed old records.   Final Clinical Impression(s) / ED Diagnosis  Final diagnoses:  AKI (acute kidney injury) ([place])  Confusion       Note:  This document was prepared using Dragon voice recognition software and may include unintentional dictation errors.   Lilia Pro., MD 07/17/19 [phone]

## 2019-07-17 NOTE — ED Notes (Signed)
Pt transported to MRI 

## 2019-07-17 NOTE — ED Triage Notes (Signed)
Pt comes via [facility] from home with c/o possible stroke. Pt states last night around 8pm she began having trouble formulating her words.  Pt also c/o seeing things. Pt states she has one dog but is seeing 2 dogs. Pt denies any blurred vision.  Pt denies any CP or SOB. Pt states some weakness. Pt states she fell last night and hit her head. Pt states following that she began having the trouble finding her words. Pt denies any blood thinners.  Pt is A*OX4. Pt is VAN negative and denies any headache.

## 2019-07-17 NOTE — ED Notes (Signed)
NPO after midnight. Pt will have US done in the morning

## 2019-07-17 NOTE — H&P (Signed)
History and Physical:    Gwendolyn [name]   HKV:[id number] DOB: 1946-03-07 DOA: 07/17/2019  Referring MD/provider: Dr. Joan [name] PCP: Gwendolyn Boys, MD   Patient coming from: Home  Chief Complaint: Visual hallucinations for 1 to 2 days  History of Present Illness:   Gwendolyn [name] is an 74 y.o. female with PMH significant for HTN, steatohepatitis and possible cirrhosis, fibromyalgia, chronic pain, depression and history of acute renal failure in 2019 secondary to hypotension who was in her usual state of reasonably good health until 1 to 2 days ago when she started having visual hallucinations.  Patient states that she started seeing her dogs around her and was talking about them even though she knows that she has only 1 dog and her other dogs have passed away.  Patient states that her son noted that she was acting strangely so suggested she came into the hospital.  At present patient denies feeling confused.  She knows she is in the hospital and why she came in.  She notes that it is her visual hallucinations and her somewhat foggy thinking that is of most concern to her.  Other than her visual hallucinations patient does not feel like she has been unwell.  She does admit to feeling sort of "foggy headed".  She does admit to some fatigue and some intermittent dyspnea on exertion but does not think they have been too bad.  Of note patient has been started on enalapril in the past week for control of her high blood pressure.  She had previously only been on Toprol-XL.  Also of note her transdermal buprenorphine was changed to sublingual in the past week.  Review of systems is notable for a sense of abdominal "tightness" for the past 3 weeks.  She denies any dysuria urgency or frequency.  No flank pain.  No fevers chills or loss of appetite.  No nausea vomiting or diarrhea.  No change in her taste or smell.  No cough.  No clumsiness or difficulty walking or talking.  No hemiplegia or  paralysis.  ED Course:  The patient was noted to be quite hypotensive when she presented to the ED with blood pressure 71/41.  She was also noted to be in acute renal failure with creatinine of 4.  Review of other blood tests reveal a normal ammonia.  UA is slightly dirty.  Head CT was done which shows lateral lacunar basal ganglia infarcts 1 of which is remote and the other one is indeterminate.  Patient was treated with IV fluid resuscitation for treatment of hypotension and acute renal failure and called in for admission.  ROS:   ROS   Review of Systems: As per HPI   Past Medical History:   Past Medical History:  Diagnosis Date  . Anxiety   . Arthritis   . Asthma   . Breast cancer ([place]) 01/2010   left mastectomy, LN neg, on femara  . Chronic low back pain   . Cirrhosis of liver ([place])   . Depression   . Fibromyalgia   . GERD (gastroesophageal reflux disease)   . History of chicken pox   . HTN (hypertension)   . Hyperlipidemia   . Hypothyroidism   . IBS (irritable bowel syndrome)   . Migraines   . NASH (nonalcoholic steatohepatitis)    Follow[facility] by Dr. Gerald Dexter at [facility]  . PONV (postoperative nausea and vomiting)   . RLS (restless legs syndrome)   . Seasonal allergies   . Trigeminal  neuralgia     Past Surgical History:   Past Surgical History:  Procedure Laterality Date  . ABDOMINAL HYSTERECTOMY    . ANKLE SURGERY  2006  . BREAST BIOPSY  2011   [place]  . CHOLECYSTECTOMY  2005  . CYSTOCELE REPAIR    . DILATION AND CURETTAGE OF UTERUS    . GASTRIC BYPASS  2008  . KNEE ARTHROSCOPY Left 09/17/2018   Procedure: KNEE ARTHROSCOPY WITH DEBRIDEMENT, PARTIAL MEDIAL MENISCECTOMY AND SUB-CHONDROPLASTY OF A BONE MARROW LESION INVOLVING THE MEIDAL TIBIAL PLATEAU;  Surgeon: Corky Mull, MD;  Location: [facility] ORS;  Service: Orthopedics;  Laterality: Left;  Marland Kitchen MASTECTOMY  2011   Breast Cancer  . RECTOCELE REPAIR    . ROTATOR CUFF REPAIR  2001  . SEPTOPLASTY    . TONSILLECTOMY  age 73  .  TOTAL ABDOMINAL HYSTERECTOMY W/ BILATERAL SALPINGOOPHORECTOMY  1991  . VAGINAL DELIVERY     x3    Social History:   Social History   Socioeconomic History  . Marital status: Widowed    Spouse name: Not on file  . Number of children: 2  . Years of education: Not on file  . Highest education level: Not on file  Occupational History  . 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 Smoking status: Never Smoker  . Smokeless tobacco: Never Used  Substance and Sexual Activity  . Alcohol use: Not Currently    Comment: Occasional  . Drug use: No  . Sexual activity: Not on file  Other Topics Concern  . Not on file  Social History Narrative   Lives with husband who has MS.      Regular Exercise -  Yes, water aerobics 3 times a week   Daily Caffeine Use:  NO            Social Determinants of Health   Financial Resource Strain:   . Difficulty of Paying Living Expenses:   Food Insecurity:   . Worried About Charity fundraiser in the Last Year:   . Arboriculturist in the Last Year:   Transportation Needs:   . Film/video editor (Medical):   Marland Kitchen Lack of Transportation (Non-Medical):   Physical Activity:   . Days of Exercise per Week:   . Minutes of Exercise per Session:   Stress:   . Feeling of Stress :   Social Connections:   . Frequency of Communication with Friends and Family:   . Frequency of Social Gatherings with Friends and Family:   . Attends Religious Services:   . Active Member of Clubs or Organizations:   . Attends Archivist Meetings:   Marland Kitchen Marital Status:   Intimate Partner Violence:   . Fear of Current or Ex-Partner:   . Emotionally Abused:   Marland Kitchen Physically Abused:   . Sexually Abused:     Allergies   Codeine, Oxycodone, and Silver  Family history:   Family History  Problem Relation Age of Onset  . COPD Mother   . Alzheimer's disease Father   . Cancer Sister 24       Breast  . Arthritis Maternal Grandmother   .  Arthritis Maternal Grandfather     Current Medications:   Prior to Admission medications   Medication Sig Start Date End Date Taking? Authorizing Provider  acetaminophen (TYLENOL) 500 MG tablet Take 1,000 mg by mouth every 6 (six) hours as needed for moderate pain or headache.  Yes [provider]  albuterol (PROVENTIL HFA;VENTOLIN HFA) 108 (90 Base) MCG/ACT inhaler Inhale 2 puffs into the lungs every 6 (six) hours as needed for wheezing or shortness of breath.   Yes [provider]  atorvastatin (LIPITOR) 40 MG tablet Take 40 mg by mouth at bedtime.    Yes [provider]  baclofen (LIORESAL) 10 MG tablet Take 10-20 mg by mouth See admin instructions. Take 1 tablet (24m) by mouth every morning and take 2 tablets (260m by mouth every evening   Yes [provider]  buprenorphine (SUBUTEX) 2 MG SUBL SL tablet Place 2 mg under the tongue in the morning and at bedtime.   Yes [provider]  calcium carbonate (OS-CAL - DOSED IN MG OF ELEMENTAL CALCIUM) 1250 (500 Ca) MG tablet Take 1 tablet by mouth daily.   Yes [provider]  Cholecalciferol (VITAMIN D) 50 MCG (2000 UT) tablet Take 2,000 Units by mouth daily.   Yes [provider]  enalapril (VASOTEC) 10 MG tablet Take 10 mg by mouth daily.   Yes [provider]  escitalopram (LEXAPRO) 20 MG tablet Take 20 mg by mouth daily.  07/19/17  Yes [provider]  fluticasone (FLONASE) 50 MCG/ACT nasal spray Place 1 spray into both nostrils 2 (two) times daily as needed for allergies.    Yes [provider]  fluticasone (FLOVENT HFA) 110 MCG/ACT inhaler Inhale 1 puff into the lungs 2 (two) times daily as needed (shortness of breath).    Yes [provider]  gabapentin (NEURONTIN) 300 MG capsule Take 600 mg by mouth 3 (three) times daily.    Yes [provider]  ibandronate (BONIVA) 150 MG tablet Take 150 mg by mouth every 30 (thirty) days.    Yes  [provider]  levothyroxine (SYNTHROID, LEVOTHROID) 50 MCG tablet Take 1 tablet (50 mcg total) by mouth daily. 07/08/13  Yes WaJackolyn ConferMD  metoprolol succinate (TOPROL-XL) 25 MG 24 hr tablet Take 1 tablet (25 mg total) by mouth daily. 07/08/13  Yes WaJackolyn ConferMD  Multiple Vitamins-Minerals (MULTIVITAMIN WITH MINERALS) tablet Take 1 tablet by mouth daily.   Yes [provider]  omeprazole (PRILOSEC) 20 MG capsule Take 20 mg by mouth daily.    Yes [provider]  oxybutynin (DITROPAN-XL) 10 MG 24 hr tablet Take 10 mg by mouth at bedtime.    Yes [provider]  rOPINIRole (REQUIP) 2 MG tablet Take 2 mg by mouth 3 (three) times daily.   Yes [provider]  vitamin E 1000 UNIT capsule Take 1 tablet by mouth daily.    Yes [provider]    Physical Exam:   Vitals:   07/17/19 1300 07/17/19 1423 07/17/19 1530 07/17/19 1700  BP: (!) 71/41 97/67 (!) 87/62 91/65  Pulse: 72  81 88  Resp:  17 13 16   Temp:      TempSrc:      SpO2: 96%  95% 92%  Weight:      Height:         Physical Exam: Blood pressure 91/65, pulse 88, temperature 98 F (36.7 C), resp. rate 16, height 5' 3"  (1.6 m), weight 99.3 kg, SpO2 92 %. Gen: Friendly, pleasant obese female lying in bed in no acute distress. Eyes: sclera anicteric, conjuctiva mildly injected bilaterally CVS: S1-S2, regular, no gallops Respiratory:  decreased air entry likely secondary to decreased inspiratory effort GI: Obese versus ascites, positive bowel sounds, nontender to palpation.  LE: No edema. No cyanosis, multiple varicosities. Neuro: A/O x 3, Moving all extremities equally with normal strength, CN 3-12 intact, grossly nonfocal.  Psych: patient is logical and coherent, judgement and insight appear normal, mood and affect appropriate to situation.   Data Review:    Labs: Basic Metabolic Panel: Recent Labs  Lab 07/17/19 1147  NA 136  K 5.0  CL 102  CO2 19*    GLUCOSE 102*  BUN 60*  CREATININE 4.21*  CALCIUM 9.0   Liver Function Tests: Recent Labs  Lab 07/17/19 1147  AST 85*  ALT 44  ALKPHOS 58  BILITOT 1.0  PROT 7.3  ALBUMIN 3.7   No results for input(s): LIPASE, AMYLASE in the last 168 hours. Recent Labs  Lab 07/17/19 1401  AMMONIA 15   CBC: Recent Labs  Lab 07/17/19 1147  WBC 7.9  NEUTROABS 6.2  HGB 13.2  HCT 41.0  MCV 95.8  PLT 183   Cardiac Enzymes: No results for input(s): CKTOTAL, CKMB, CKMBINDEX, TROPONINI in the last 168 hours.  BNP (last 3 results) No results for input(s): PROBNP in the last 8760 hours. CBG: No results for input(s): GLUCAP in the last 168 hours.  Urinalysis    Component Value Date/Time   COLORURINE YELLOW (A) 07/17/2019 1452   APPEARANCEUR HAZY (A) 07/17/2019 1452   LABSPEC 1.011 07/17/2019 1452   PHURINE 5.0 07/17/2019 1452   GLUCOSEU NEGATIVE 07/17/2019 1452   HGBUR MODERATE (A) 07/17/2019 1452   BILIRUBINUR NEGATIVE 07/17/2019 1452   BILIRUBINUR neg 11/12/2012 1451   KETONESUR NEGATIVE 07/17/2019 1452   PROTEINUR 30 (A) 07/17/2019 1452   UROBILINOGEN 0.2 11/12/2012 1451   NITRITE NEGATIVE 07/17/2019 1452   LEUKOCYTESUR SMALL (A) 07/17/2019 1452      Radiographic Studies: DG Ribs Unilateral W/Chest Right  Result Date: 07/17/2019 CLINICAL DATA:  Right-sided rib pain. Two falls in the past few weeks. EXAM: RIGHT RIBS AND CHEST - 3+ VIEW COMPARISON:  Chest x-ray dated October 08, 2018. FINDINGS: No fracture or other bone lesions are seen involving the ribs. There is no evidence of pneumothorax or pleural effusion. Both lungs are clear. Heart size and mediastinal contours are within normal limits. Unchanged thoracolumbar scoliosis. IMPRESSION: Negative. Electronically Signed   By: Titus Dubin M.D.   On: 07/17/2019 14:05   CT HEAD WO CONTRAST  Result Date: 07/17/2019 CLINICAL DATA:  [place] last night and hit head. Trouble finding her words. EXAM: CT HEAD WITHOUT CONTRAST TECHNIQUE:  Contiguous axial images were obtained from the base of the skull through the vertex without intravenous contrast. COMPARISON:  None. FINDINGS: Brain: Mild age related cerebral atrophy. The ventricles are in the midline without mass effect or shift. They are normal in size and configuration. The gray-white differentiation is maintained. No extra-axial fluid collections are identified. No findings for hemispheric infarction or intracranial hemorrhage. Bilateral lacunar type basal ganglia infarcts are noted. The larger area on the right is quite hypodense and likely remote. The 2 smaller areas on the left are indeterminate. The brainstem and cerebellum appear normal. Vascular: Vascular calcifications. No hyperdense vessels or aneurysm. Skull: No skull fracture or bone lesions. Sinuses/Orbits: The paranasal sinuses and mastoid air cells are clear. The globes are intact. Other: No scalp lesions or hematoma. IMPRESSION: 1. Age related cerebral atrophy. 2. Bilateral lacunar type basal ganglia infarcts. The larger area on the right is quite hypodense and likely remote. The 2 smaller areas on the left are indeterminate. 3. No findings for hemispheric infarction or intracranial  hemorrhage. Electronically Signed   By: Marijo Sanes M.D.   On: 07/17/2019 12:20    EKG: Independently reviewed.  Patient's rhythm is not entirely clear to me although it is narrow complex and regular, with normal rate at 84.  LAD at -14.  Nonspecific ST-T wave flattening in precordial leads.  Of note previous EKG is read as third-degree heart block.  I myself am unable to affirm that this EKG has P waves that are marching through.   Assessment/Plan:   Principal Problem:   Metabolic encephalopathy Active Problems:   Depression   Essential hypertension, benign   Fibromyalgia   GERD (gastroesophageal reflux disease)   ARF (acute renal failure) (HCC)   Cirrhosis of liver ([place])   Cerebrovascular disease  74 year old female with  possible cirrhosis secondary to steatohepatitis presents with hypotension after starting Vasotec last week and acute renal failure with creatinine of 4.  She has a previous history of acute renal failure secondary to hypotension in 2019.  Acute metabolic encephalopathy with visual hallucinations. Cause of visual hallucinations is not entirely clear. She does have some asterixis on exam so this may be early hepatic encephalopathy despite normal ammonia, which does not rule out hepatic encephalopathy. She may also be having some level you of uremia given GFR of 10 although her BUN is not very high. Also of note she has recently changed from buprenorphine transdermal to sublingual which may also be contributory. Also of note she has bilateral basal ganglia infarct of unclear age, 41 of which seem remote however the other one is indeterminate. At this point my plan is to try to treat her kidney failure to see if her hallucinations resolved, if not would have low threshold for starting lactulose.  Acute renal failure/hypotension Unclear etiology however most likely secondary to hypotension complicated by ACE inhibitor use which was added recently last week. Cause of hypotension itself is not entirely clear although possibly the ACE inhibitor could have precipitated that if she were intravascularly fluid depleted. Will treat with fluid resuscitation, normal saline 150 cc an hour x48 hours. Of note she received 3 L in the ED without much improvement of blood pressure. I suspect this will improve as her Vasotec wears off, hold Vasotec. I hope she has not tipped into hepatorenal syndrome. Urine sodium, urine creatinine and renal ultrasound are ordered. Can consider renal consultation if warranted  Cirrhosis Cirrhosis mentioned in previous problem list however patient does not know if she has cirrhosis. Will order abdominal ultrasound to look at liver and to evaluate for ascites. INR ordered for tomorrow  morning. Total protein and albumin are relatively within normal limits. Also of note, patient does complain of a feeling of tightness in her abdomen over the past 3 to 4 weeks, so she does have cirrhosis, its possible that she is developing decompensated cirrhosis with development of ascites.  Abdominal ultrasound as noted above. Also of note, patient does have mild asterixis on exam. Low ammonia does not rule out hepatic encephalopathy However at this point I am not starting lactulose as I would like to see whether her symptoms resolve with treatment of her acute renal failure. If she continues to have visual hallucinations, would have very low threshold for starting lactulose.  CVA Patient is undergoing an MRI per ED request. I believe they have also consulted neurology for their input. In the meanwhile I do not think the visual hallucinations are likely referable to a remote CVA as they are new and acute over  the past 1 to 2 days. She is not on any antiplatelet agents.  Abnormal UA Patient does have an abnormal UA that is slightly dirty Given visual hallucinations which can be a manifestation of infection, I will start treatment with ceftriaxone. Urine culture requested.  Abnormal EKG Patient has a narrow complex regular rhythm at 84.  Previous EKG had been read by the computer as third-degree heart block.  I am placing patient on telemetry with repeat EKG in the morning, hopefully of a better quality.  Can consider reviewing this with cardiology to see if she needs any further work-up.  HTN Patient is hypotensive. Hold enalapril and Toprol-XL  Chronic pain Continue Subutex sublingual, we do not have transdermal Subutex here. Of note she was recently changed from transdermal to sublingual Subutex, I do not think this is likely contributory to her visual hallucinations but it is in the differential. Patient is also on baclofen, Neurontin and Requip I am also reluctantly continuing all  3 as I do not think these are contributing to her metabolic encephalopathy as these are not new medications however all of these can certainly be psychoactive and cause visual hallucinations. Could consider discontinuing these if hallucinations do not resolve with treatment of acute renal failure  Fibromyalgia/depression Continue Lexapro  Hypothyroidism Continue Synthroid Check TSH in the morning  GERD Continue PPI    Other information:   DVT prophylaxis: Subcutaneous unfractionated heparin ordered. Code Status: Full Family Communication: Patient states she has been speaking with her son while she has been here Disposition Plan: Home Consults called: Neurology may have been called by ED Admission status: Inpatient  [facility]  If 7PM-7AM, please contact night-coverage [email or web address] Password [email or web address] 07/17/2019, 5:11 PM

## 2019-07-17 NOTE — ED Notes (Signed)
Pt back from MRI. Placed back on monitor. Purewick paced on pt. Meal given.

## 2019-07-18 ENCOUNTER — Inpatient Hospital Stay: Payer: Medicare Other

## 2019-07-18 ENCOUNTER — Other Ambulatory Visit: Payer: Self-pay

## 2019-07-18 DIAGNOSIS — G9341 Metabolic encephalopathy: Secondary | ICD-10-CM

## 2019-07-18 LAB — GLUCOSE, CAPILLARY: Glucose-Capillary: 131 mg/dL — ABNORMAL HIGH (ref 70–99)

## 2019-07-18 LAB — CBC
HCT: 37.3 % (ref 36.0–46.0)
Hemoglobin: 12.1 g/dL (ref 12.0–15.0)
MCH: 31.2 pg (ref 26.0–34.0)
MCHC: 32.4 g/dL (ref 30.0–36.0)
MCV: 96.1 fL (ref 80.0–100.0)
Platelets: 128 10*3/uL — ABNORMAL LOW (ref 150–400)
RBC: 3.88 MIL/uL (ref 3.87–5.11)
RDW: 14.3 % (ref 11.5–15.5)
WBC: 4 10*3/uL (ref 4.0–10.5)
nRBC: 0 % (ref 0.0–0.2)

## 2019-07-18 LAB — URINE CULTURE: Culture: 20000 — AB

## 2019-07-18 LAB — COMPREHENSIVE METABOLIC PANEL
ALT: 39 U/L (ref 0–44)
AST: 74 U/L — ABNORMAL HIGH (ref 15–41)
Albumin: 3 g/dL — ABNORMAL LOW (ref 3.5–5.0)
Alkaline Phosphatase: 57 U/L (ref 38–126)
Anion gap: 8 (ref 5–15)
BUN: 37 mg/dL — ABNORMAL HIGH (ref 8–23)
CO2: 21 mmol/L — ABNORMAL LOW (ref 22–32)
Calcium: 8.6 mg/dL — ABNORMAL LOW (ref 8.9–10.3)
Chloride: 114 mmol/L — ABNORMAL HIGH (ref 98–111)
Creatinine, Ser: 1.34 mg/dL — ABNORMAL HIGH (ref 0.44–1.00)
GFR calc Af Amer: 46 mL/min — ABNORMAL LOW (ref >60)
GFR calc non Af Amer: 40 mL/min — ABNORMAL LOW (ref >60)
Glucose, Bld: 124 mg/dL — ABNORMAL HIGH (ref 70–99)
Potassium: 4.3 mmol/L (ref 3.5–5.1)
Sodium: 143 mmol/L (ref 135–145)
Total Bilirubin: 0.7 mg/dL (ref 0.3–1.2)
Total Protein: 6.3 g/dL — ABNORMAL LOW (ref 6.5–8.1)

## 2019-07-18 LAB — PROTIME-INR
INR: 1 (ref 0.8–1.2)
Prothrombin Time: 13.2 seconds (ref 11.4–15.2)

## 2019-07-18 LAB — SARS CORONAVIRUS 2 (TAT 6-24 HRS): SARS Coronavirus 2: NEGATIVE

## 2019-07-18 MED ORDER — OXYCODONE HCL 5 MG PO TABS: 5.0000 mg | ORAL_TABLET | Freq: Four times a day (QID) | ORAL | Status: DC | PRN

## 2019-07-18 MED ORDER — SODIUM CHLORIDE 0.9 % IV SOLN
2.0000 g | INTRAVENOUS | Status: DC
  Filled 2019-07-18: qty 20

## 2019-07-18 MED ORDER — ONDANSETRON HCL 4 MG/2ML IJ SOLN
4.0000 mg | Freq: Four times a day (QID) | INTRAMUSCULAR | Status: DC | PRN
  Administered 2019-07-18: 4 mg via INTRAVENOUS
  Filled 2019-07-18: qty 2

## 2019-07-18 NOTE — Progress Notes (Signed)
PROGRESS NOTE    Gwendolyn [name]  SNK:[id number] DOB: [birth year] DOA: 07/17/2019 PCP: Barbaraann Boys, MD    Assessment & Plan:   Principal Problem:   Metabolic encephalopathy Active Problems:   Depression   Essential hypertension, benign   Fibromyalgia   GERD (gastroesophageal reflux disease)   ARF (acute renal failure) (HCC)   Cirrhosis of liver ([place])   Cerebrovascular disease    Gwendolyn [name] is an 74 y.o. Caucasian female with PMH significant for HTN, steatohepatitis and possible cirrhosis, fibromyalgia, chronic pain on chronic opioids, depression and history of acute renal failure in 2019 secondary to hypotension who was in her usual state of reasonably good health until 1 to 2 days ago when she started having visual hallucinations.    Acute metabolic encephalopathy with visual hallucinations 2/2 new Suboxone Extreme somnolence 2/2 new Suboxone --Per son, pt had just recently got switched from buprenorphine transdermal to SL suboxone BID 2 days prior to presentation.  Pt admitted to being very sleepy after starting Suboxone.  Pt also noted to be very sleepy (fell asleep in mid-sentences) after receiving Suboxone in the hospital. PLAN: --d/c SL Suboxone --Oxycodone 5 mg q6h PRN instead --Will need to be seen in pain clinic soon after discharge to adjust her pain meds.  Acute renal failure 2/2 hypotension  --Pt was very somnolent from Suboxone use, so may have decreased PO intake, while still taking all her BP meds.  Too much opioids may also have dropped her BP. --ACE inhibitor was added recently last week.  --Both Cr and BP improved with IVF (3 L in the ED f/b MIVF) PLAN: --d/c MIVF --continue to hold home BP meds for now  Cirrhosis --abdominal ultrasound showed cirrhotic appearing liver with no free fluid. Total protein and albumin are relatively within normal limits.  Ammonia wnl.  CVA ruled out MRI showed no acute finding.  Abnormal UA, asymptomatic --d/c  ceftriaxone  HTN Patient is hypotensive. Hold enalapril and Toprol-XL  Chronic pain --d/c SL Suboxone --Oxycodone 5 mg q6h PRN instead --continue home gabapentin --Will need to be seen in pain clinic soon after discharge to adjust her pain meds.  Fibromyalgia/depression Continue Lexapro  Hypothyroidism Continue Synthroid  GERD Continue PPI   DVT prophylaxis: Heparin SQ Code Status: Full code  Family Communication: Son updated at bedside today Disposition Plan: Home tomorrow   Subjective and Interval History:  Pt had no complaints this morning, but was very sleepy after taking her SL suboxone.  No fever, N/V/D.     Objective: Vitals:   07/18/19 0553 07/18/19 0945 07/18/19 1136 07/18/19 1634  BP: (!) 142/67 (!) 156/82 131/73 129/66  Pulse: 86 88 87 85  Resp:   20 (!) 21  Temp: 98.3 F (36.8 C)  97.6 F (36.4 C) 98.6 F (37 C)  TempSrc: Oral  Oral   SpO2: 98% 99% 94% 93%  Weight:      Height:        Intake/Output Summary (Last 24 hours) at 07/18/2019 1817 Last data filed at 07/18/2019 1500 Gross per 24 hour  Intake 0 ml  Output 400 ml  Net -400 ml   Filed Weights   07/17/19 1126 07/18/19 0313  Weight: 99.3 kg 100.5 kg    Examination:   Constitutional: NAD, oriented, very sleepy and fell asleep in mid-sentences, but arousable.  HEENT: conjunctivae and lids normal, EOMI CV: RRR no M,R,G. Distal pulses +2.  No cyanosis.   RESP: CTA B/L, normal respiratory effort  GI: +  BS, NTND Extremities: No effusions, edema, or tenderness in BLE SKIN: warm, dry and intact Neuro: II - XII grossly intact.  Sensation intact   Data Reviewed: I have personally reviewed following labs and imaging studies  CBC: Recent Labs  Lab 07/17/19 1147 07/18/19 0402  WBC 7.9 4.0  NEUTROABS 6.2  --   HGB 13.2 12.1  HCT 41.0 37.3  MCV 95.8 96.1  PLT 183 528*   Basic Metabolic Panel: Recent Labs  Lab 07/17/19 1147 07/17/19 1401 07/18/19 0402  NA 136  --  143  K 5.0   --  4.3  CL 102  --  114*  CO2 19*  --  21*  GLUCOSE 102*  --  124*  BUN 60*  --  37*  CREATININE 4.21* 3.98* 1.34*  CALCIUM 9.0  --  8.6*   GFR: Estimated Creatinine Clearance: 43.5 mL/min (A) (by C-G formula based on SCr of 1.34 mg/dL (H)). Liver Function Tests: Recent Labs  Lab 07/17/19 1147 07/18/19 0402  AST 85* 74*  ALT 44 39  ALKPHOS 58 57  BILITOT 1.0 0.7  PROT 7.3 6.3*  ALBUMIN 3.7 3.0*   No results for input(s): LIPASE, AMYLASE in the last 168 hours. Recent Labs  Lab 07/17/19 1401  AMMONIA 15   Coagulation Profile: Recent Labs  Lab 07/17/19 1147 07/18/19 0402  INR 0.9 1.0   Cardiac Enzymes: No results for input(s): CKTOTAL, CKMB, CKMBINDEX, TROPONINI in the last 168 hours. BNP (last 3 results) No results for input(s): PROBNP in the last 8760 hours. HbA1C: No results for input(s): HGBA1C in the last 72 hours. CBG: No results for input(s): GLUCAP in the last 168 hours. Lipid Profile: No results for input(s): CHOL, HDL, LDLCALC, TRIG, CHOLHDL, LDLDIRECT in the last 72 hours. Thyroid Function Tests: No results for input(s): TSH, T4TOTAL, FREET4, T3FREE, THYROIDAB in the last 72 hours. Anemia Panel: No results for input(s): VITAMINB12, FOLATE, FERRITIN, TIBC, IRON, RETICCTPCT in the last 72 hours. Sepsis Labs: No results for input(s): PROCALCITON, LATICACIDVEN in the last 168 hours.  Recent Results (from the past 240 hour(s))  SARS CORONAVIRUS 2 (TAT 6-24 HRS) Nasopharyngeal Nasopharyngeal Swab     Status: None   Collection Time: 07/17/19  3:16 PM   Specimen: Nasopharyngeal Swab  Result Value Ref Range Status   SARS Coronavirus 2 NEGATIVE NEGATIVE Final    Comment: (NOTE) SARS-CoV-2 target nucleic acids are NOT DETECTED. The SARS-CoV-2 RNA is generally detectable in upper and lower respiratory specimens during the acute phase of infection. Negative results do not preclude SARS-CoV-2 infection, do not rule out co-infections with other pathogens, and  should not be used as the sole basis for treatment or other patient management decisions. Negative results must be combined with clinical observations, patient history, and epidemiological information. The expected result is Negative. Fact Sheet for Patients: SugarRoll.be Fact Sheet for Healthcare Providers: https://www.woods-mathews.com/ This test is not yet approved or cleared by the [place] FDA and  has been authorized for detection and/or diagnosis of SARS-CoV-2 by FDA under an Emergency Use Authorization (EUA). This EUA will remain  in effect (meaning this test can be used) for the duration of the COVID-19 declaration under Section 56 4(b)(1) of the Act, 21 U.S.C. section 360bbb-3(b)(1), unless the authorization is terminated or revoked sooner. Performed at [facility], [place] [address], [place], [place] [address]   Urine Culture     Status: Abnormal   Collection Time: 07/17/19  3:16 PM   Specimen: Urine,  Clean Catch  Result Value Ref Range Status   Specimen Description URINE, CLEAN CATCH  Final   Special Requests   Final    NONE Performed at [facility], [address] [place], [place] [address]    Culture (A)  Final    20,000 COLONIES/mL MULTIPLE SPECIES PRESENT, SUGGEST RECOLLECTION   Report Status 07/18/2019 FINAL  Final      Radiology Studies: DG Ribs Unilateral W/Chest Right  Result Date: 07/17/2019 CLINICAL DATA:  Right-sided rib pain. Two falls in the past few weeks. EXAM: RIGHT RIBS AND CHEST - 3+ VIEW COMPARISON:  Chest x-ray dated October 08, 2018. FINDINGS: No fracture or other bone lesions are seen involving the ribs. There is no evidence of pneumothorax or pleural effusion. Both lungs are clear. Heart size and mediastinal contours are within normal limits. Unchanged thoracolumbar scoliosis. IMPRESSION: Negative. Electronically Signed   By: Titus Dubin M.D.   On: 07/17/2019 14:05   CT HEAD WO  CONTRAST  Result Date: 07/17/2019 CLINICAL DATA:  [place] last night and hit head. Trouble finding her words. EXAM: CT HEAD WITHOUT CONTRAST TECHNIQUE: Contiguous axial images were obtained from the base of the skull through the vertex without intravenous contrast. COMPARISON:  None. FINDINGS: Brain: Mild age related cerebral atrophy. The ventricles are in the midline without mass effect or shift. They are normal in size and configuration. The gray-white differentiation is maintained. No extra-axial fluid collections are identified. No findings for hemispheric infarction or intracranial hemorrhage. Bilateral lacunar type basal ganglia infarcts are noted. The larger area on the right is quite hypodense and likely remote. The 2 smaller areas on the left are indeterminate. The brainstem and cerebellum appear normal. Vascular: Vascular calcifications. No hyperdense vessels or aneurysm. Skull: No skull fracture or bone lesions. Sinuses/Orbits: The paranasal sinuses and mastoid air cells are clear. The globes are intact. Other: No scalp lesions or hematoma. IMPRESSION: 1. Age related cerebral atrophy. 2. Bilateral lacunar type basal ganglia infarcts. The larger area on the right is quite hypodense and likely remote. The 2 smaller areas on the left are indeterminate. 3. No findings for hemispheric infarction or intracranial hemorrhage. Electronically Signed   By: Marijo Sanes M.D.   On: 07/17/2019 12:20   MR Brain Wo Contrast (neuro protocol)  Result Date: 07/17/2019 CLINICAL DATA:  Confusion.  Cirrhosis and chronic liver disease. EXAM: MRI HEAD WITHOUT CONTRAST TECHNIQUE: Multiplanar, multiecho pulse sequences of the brain and surrounding structures were obtained without intravenous contrast. COMPARISON:  Head CT same day. Fell last night. Speech disturbance. FINDINGS: Brain: Diffusion imaging does not show any acute or subacute infarction. The brainstem and cerebellum are normal. Cerebral hemispheres do not show any  evidence of old or acute small or large vessel infarction. Dilated perivascular spaces are present at the base of the brain. No mass lesion, hemorrhage, hydrocephalus or extra-axial collection. Vascular: Major vessels at the base of the brain show flow. Skull and upper cervical spine: Negative Sinuses/Orbits: Clear/normal Other: None IMPRESSION: No acute brain finding. Low densities at the base of the brain on CT represent dilated perivascular spaces rather than old infarctions. There is no old small or large vessel brain insult. The examination is normal for age, allowing for motion degradation. Electronically Signed   By: Nelson Chimes M.D.   On: 07/17/2019 20:31   US Abdomen Complete  Result Date: 07/18/2019 CLINICAL DATA:  Cirrhosis, ascites, acute renal failure, history gastric bypass, breast cancer EXAM: ABDOMEN ULTRASOUND COMPLETE COMPARISON:  Renal ultrasound  01/10/2018 FINDINGS: Gallbladder: Surgically absent Common bile duct: Diameter: 5 mm, normal Liver: Echogenic hepatic parenchyma with coarsened echogenicity and slightly nodular hepatic contours consistent with cirrhosis. No focal hepatic mass. Portal vein is patent on color Doppler imaging with normal direction of blood flow towards the liver. IVC: Visualized IVC unremarkable Pancreas: Obscured by bowel gas Spleen: Normal appearance, 8.7 cm length Right Kidney: Length: 10.7 cm. Cortical thinning. Normal cortical echogenicity. No mass or hydronephrosis. Left Kidney: Length: 11.7 cm. Cortical thinning. Normal cortical thickness and echogenicity. No mass or hydronephrosis. Abdominal aorta: Obscured by bowel gas Other findings: No free fluid. Limitations of exam secondary to body habitus and inability to suspend respiration. IMPRESSION: Cirrhotic appearing liver. Post cholecystectomy. Nonvisualization of pancreas and aorta. Electronically Signed   By: Lavonia Dana M.D.   On: 07/18/2019 08:45     Scheduled Meds: . atorvastatin  40 mg Oral QHS  .  escitalopram  20 mg Oral Daily  . gabapentin  600 mg Oral TID  . heparin  5,000 Units Subcutaneous Q8H  . levothyroxine  50 mcg Oral Daily  . pantoprazole  40 mg Oral Daily  . rOPINIRole  2 mg Oral TID  . sodium chloride flush  3 mL Intravenous Once   Continuous Infusions:   LOS: 1 day     Enzo Bi, MD [facility] If 7PM-7AM, please contact night-coverage 07/18/2019, 6:17 PM

## 2019-07-18 NOTE — Plan of Care (Signed)
  Problem: Education: Goal: Knowledge of General Education information will improve Description: Including pain rating scale, medication(s)/side effects and non-pharmacologic comfort measures Outcome: Progressing   Problem: Health Behavior/Discharge Planning: Goal: Ability to manage health-related needs will improve Outcome: Progressing   Problem: Activity: Goal: Risk for activity intolerance will decrease Outcome: Progressing   

## 2019-07-19 LAB — CBC
HCT: 37.4 % (ref 36.0–46.0)
Hemoglobin: 12.1 g/dL (ref 12.0–15.0)
MCH: 31 pg (ref 26.0–34.0)
MCHC: 32.4 g/dL (ref 30.0–36.0)
MCV: 95.9 fL (ref 80.0–100.0)
Platelets: 147 10*3/uL — ABNORMAL LOW (ref 150–400)
RBC: 3.9 MIL/uL (ref 3.87–5.11)
RDW: 14.6 % (ref 11.5–15.5)
WBC: 3.6 10*3/uL — ABNORMAL LOW (ref 4.0–10.5)
nRBC: 0 % (ref 0.0–0.2)

## 2019-07-19 LAB — BASIC METABOLIC PANEL
Anion gap: 6 (ref 5–15)
BUN: 21 mg/dL (ref 8–23)
CO2: 25 mmol/L (ref 22–32)
Calcium: 9.1 mg/dL (ref 8.9–10.3)
Chloride: 113 mmol/L — ABNORMAL HIGH (ref 98–111)
Creatinine, Ser: 0.71 mg/dL (ref 0.44–1.00)
GFR calc Af Amer: >60 mL/min (ref >60)
GFR calc non Af Amer: >60 mL/min (ref >60)
Glucose, Bld: 129 mg/dL — ABNORMAL HIGH (ref 70–99)
Potassium: 4.7 mmol/L (ref 3.5–5.1)
Sodium: 144 mmol/L (ref 135–145)

## 2019-07-19 LAB — MAGNESIUM: Magnesium: 2.3 mg/dL (ref 1.7–2.4)

## 2019-07-19 MED ORDER — SODIUM CHLORIDE 0.9% FLUSH: 3.0000 mL | Freq: Two times a day (BID) | INTRAVENOUS | Status: DC

## 2019-07-19 MED ORDER — ENALAPRIL MALEATE 10 MG PO TABS: ORAL_TABLET | ORAL | Status: DC

## 2019-07-19 MED ORDER — OXYCODONE HCL 5 MG PO TABS: 5.0000 mg | ORAL_TABLET | Freq: Four times a day (QID) | ORAL | 0 refills | Status: DC | PRN

## 2019-07-19 NOTE — Discharge Summary (Signed)
Physician Discharge Summary   Gwendolyn [name]  female DOB: 12/05/1945  JSH:[id number]  PCP: Barbaraann Boys, MD  Admit date: 07/17/2019 Discharge date: 07/19/2019  Admitted From: home Disposition:  Home.  Son was updated on discharge plan, and asked to stay with pt until pt is back to baseline alertness and functional status.  CODE STATUS: Full code  Discharge Instructions    Diet - low sodium heart healthy   Complete by: As directed    Discharge instructions   Complete by: As directed    As I discussed with you and your son, it's dangerous for you to take the sublingual buprenorphine because you had lots bad effects from it, hallucination, extreme sleepiness, very low blood pressure, all leading you to have kidney injury.  Fortunately, with some IV fluids, your kidney injury has resolved.  I have prescribed you 7 days of oxycodone to tie you over until you see your pain doctor.   Dr. Enzo Bi - -   Increase activity slowly   Complete by: As directed        Hospital Course:  For full details, please see H&P, progress notes, consult notes and ancillary notes.  Briefly,  Gwendolyn [name] an 74 y.o.Caucasian femalewith PMH significant for HTN, steatohepatitis and possible cirrhosis, fibromyalgia, chronic pain on chronic opioids, depression and history of acute renal failure in 2019 secondary to hypotension who was in her usual state of reasonably good health until 1 to 2 days ago when she started having visual hallucinations.    # Acute metabolic encephalopathy with visual hallucinations 2/2 new SUBUTEX # Extreme somnolence 2/2 new SUBUTEX # Hx of chronic pain on chronic opioids Per son, pt had just recently got switched from buprenorphine transdermal to SL buprenorphine BID 2 days prior to presentation.  Pt admitted to being very sleepy after starting Subutex.  Pt also noted to be very sleepy (fell asleep in mid-sentences) after receiving Suboxone in the hospital.  Discussed  with pt and son, who agreed to stop the SL Subutex.  Pt was prescribed a short course of Oxycodone 5 mg q6h PRN to tie her over until pt can be seen in pain clinic soon after discharge to adjust her pain meds.  Acute renal failure 2/2 hypotension and dehydration Cr 4.21 on presentation.  Pt was very somnolent from Subutex use, so may have decreased PO intake, while still taking all her BP meds (enalapril was recently added last week).  Too much opioids may also have dropped her BP.  Both Cr and BP improved with IVF (3 L in the ED f/b MIVF).  Cr normalized to 0.71 on the day of discharge.  Cirrhosis abdominal ultrasound showed cirrhotic appearing liver with no free fluid.  Total protein and albumin are relatively within normal limits.  Ammonia wnl.  CVA ruled out MRI showed no acute finding.  Abnormal UA, asymptomatic Ceftriaxone started on presentation and then d/c'ed.  HTN Patient was hypotensive on presentation.  BP meds held.  Home Toprol-XL resumed at discharge, and enalapril held until outpatient PCP followup.  Fibromyalgia / depression Continued Lexapro  Hypothyroidism Continued Synthroid  GERD Continued PPI   Discharge Diagnoses:  Principal Problem:   Metabolic encephalopathy Active Problems:   Depression   Essential hypertension, benign   Fibromyalgia   GERD (gastroesophageal reflux disease)   ARF (acute renal failure) (HCC)   Cirrhosis of liver (HCC)   Cerebrovascular disease    Discharge Instructions:  Allergies as of 07/19/2019  Reactions   Codeine Nausea And Vomiting   Oxycodone Itching   Silver Other (See Comments)   (Tegaderm) Blisters.  Okay on hands.      Medication List    STOP taking these medications   buprenorphine 2 MG Subl SL tablet Commonly known as: SUBUTEX     TAKE these medications   acetaminophen 500 MG tablet Commonly known as: TYLENOL Take 1,000 mg by mouth every 6 (six) hours as needed for moderate pain or  headache.   albuterol 108 (90 Base) MCG/ACT inhaler Commonly known as: VENTOLIN HFA Inhale 2 puffs into the lungs every 6 (six) hours as needed for wheezing or shortness of breath.   atorvastatin 40 MG tablet Commonly known as: LIPITOR Take 40 mg by mouth at bedtime.   baclofen 10 MG tablet Commonly known as: LIORESAL Take 10-20 mg by mouth See admin instructions. Take 1 tablet (43m) by mouth every morning and take 2 tablets (211m by mouth every evening   calcium carbonate 1250 (500 Ca) MG tablet Commonly known as: OS-CAL - dosed in mg of elemental calcium Take 1 tablet by mouth daily.   enalapril 10 MG tablet Commonly known as: VASOTEC Hold this until outpatient PCP followup, because you had low blood pressure and kidney injury when you presented to the hospital. What changed:   how much to take  how to take this  when to take this  additional instructions   escitalopram 20 MG tablet Commonly known as: LEXAPRO Take 20 mg by mouth daily.   fluticasone 110 MCG/ACT inhaler Commonly known as: FLOVENT HFA Inhale 1 puff into the lungs 2 (two) times daily as needed (shortness of breath).   fluticasone 50 MCG/ACT nasal spray Commonly known as: FLONASE Place 1 spray into both nostrils 2 (two) times daily as needed for allergies.   gabapentin 300 MG capsule Commonly known as: NEURONTIN Take 600 mg by mouth 3 (three) times daily.   ibandronate 150 MG tablet Commonly known as: BONIVA Take 150 mg by mouth every 30 (thirty) days.   levothyroxine 50 MCG tablet Commonly known as: SYNTHROID Take 1 tablet (50 mcg total) by mouth daily.   metoprolol succinate 25 MG 24 hr tablet Commonly known as: TOPROL-XL Take 1 tablet (25 mg total) by mouth daily.   multivitamin with minerals tablet Take 1 tablet by mouth daily.   omeprazole 20 MG capsule Commonly known as: PRILOSEC Take 20 mg by mouth daily.   oxybutynin 10 MG 24 hr tablet Commonly known as: DITROPAN-XL Take 10  mg by mouth at bedtime.   oxyCODONE 5 MG immediate release tablet Commonly known as: Oxy IR/ROXICODONE Take 1 tablet (5 mg total) by mouth every 6 (six) hours as needed for moderate pain or severe pain.   rOPINIRole 2 MG tablet Commonly known as: REQUIP Take 2 mg by mouth 3 (three) times daily.   Vitamin D 50 MCG (2000 UT) tablet Take 2,000 Units by mouth daily.   vitamin E 1000 UNIT capsule Take 1 tablet by mouth daily.       Follow-up Information    BeBarbaraann BoysMD. Schedule an appointment as soon as possible for a visit in 1 week(s).   Specialty: Pediatrics Contact information: [phone]        pain clinic Follow up in 1 week(s).   Why: to adjust your pain medication regimen.          Allergies  Allergen Reactions  . Codeine  Nausea And Vomiting  . Oxycodone Itching  . Silver Other (See Comments)    (Tegaderm) Blisters.  Okay on hands.     The results of significant diagnostics from this hospitalization (including imaging, microbiology, ancillary and laboratory) are listed below for reference.   Consultations:   Procedures/Studies: DG Ribs Unilateral W/Chest Right  Result Date: 07/17/2019 CLINICAL DATA:  Right-sided rib pain. Two falls in the past few weeks. EXAM: RIGHT RIBS AND CHEST - 3+ VIEW COMPARISON:  Chest x-ray dated October 08, 2018. FINDINGS: No fracture or other bone lesions are seen involving the ribs. There is no evidence of pneumothorax or pleural effusion. Both lungs are clear. Heart size and mediastinal contours are within normal limits. Unchanged thoracolumbar scoliosis. IMPRESSION: Negative. Electronically Signed   By: Titus Dubin M.D.   On: 07/17/2019 14:05   CT HEAD WO CONTRAST  Result Date: 07/17/2019 CLINICAL DATA:  [place] last night and hit head. Trouble finding her words. EXAM: CT HEAD WITHOUT CONTRAST TECHNIQUE: Contiguous axial images were obtained from the base of the skull through the  vertex without intravenous contrast. COMPARISON:  None. FINDINGS: Brain: Mild age related cerebral atrophy. The ventricles are in the midline without mass effect or shift. They are normal in size and configuration. The gray-white differentiation is maintained. No extra-axial fluid collections are identified. No findings for hemispheric infarction or intracranial hemorrhage. Bilateral lacunar type basal ganglia infarcts are noted. The larger area on the right is quite hypodense and likely remote. The 2 smaller areas on the left are indeterminate. The brainstem and cerebellum appear normal. Vascular: Vascular calcifications. No hyperdense vessels or aneurysm. Skull: No skull fracture or bone lesions. Sinuses/Orbits: The paranasal sinuses and mastoid air cells are clear. The globes are intact. Other: No scalp lesions or hematoma. IMPRESSION: 1. Age related cerebral atrophy. 2. Bilateral lacunar type basal ganglia infarcts. The larger area on the right is quite hypodense and likely remote. The 2 smaller areas on the left are indeterminate. 3. No findings for hemispheric infarction or intracranial hemorrhage. Electronically Signed   By: Marijo Sanes M.D.   On: 07/17/2019 12:20   MR Brain Wo Contrast (neuro protocol)  Result Date: 07/17/2019 CLINICAL DATA:  Confusion.  Cirrhosis and chronic liver disease. EXAM: MRI HEAD WITHOUT CONTRAST TECHNIQUE: Multiplanar, multiecho pulse sequences of the brain and surrounding structures were obtained without intravenous contrast. COMPARISON:  Head CT same day. Fell last night. Speech disturbance. FINDINGS: Brain: Diffusion imaging does not show any acute or subacute infarction. The brainstem and cerebellum are normal. Cerebral hemispheres do not show any evidence of old or acute small or large vessel infarction. Dilated perivascular spaces are present at the base of the brain. No mass lesion, hemorrhage, hydrocephalus or extra-axial collection. Vascular: Major vessels at the base  of the brain show flow. Skull and upper cervical spine: Negative Sinuses/Orbits: Clear/normal Other: None IMPRESSION: No acute brain finding. Low densities at the base of the brain on CT represent dilated perivascular spaces rather than old infarctions. There is no old small or large vessel brain insult. The examination is normal for age, allowing for motion degradation. Electronically Signed   By: Nelson Chimes M.D.   On: 07/17/2019 20:31   US Abdomen Complete  Result Date: 07/18/2019 CLINICAL DATA:  Cirrhosis, ascites, acute renal failure, history gastric bypass, breast cancer EXAM: ABDOMEN ULTRASOUND COMPLETE COMPARISON:  Renal ultrasound 01/10/2018 FINDINGS: Gallbladder: Surgically absent Common bile duct: Diameter: 5 mm, normal Liver: Echogenic hepatic parenchyma with coarsened echogenicity and slightly  nodular hepatic contours consistent with cirrhosis. No focal hepatic mass. Portal vein is patent on color Doppler imaging with normal direction of blood flow towards the liver. IVC: Visualized IVC unremarkable Pancreas: Obscured by bowel gas Spleen: Normal appearance, 8.7 cm length Right Kidney: Length: 10.7 cm. Cortical thinning. Normal cortical echogenicity. No mass or hydronephrosis. Left Kidney: Length: 11.7 cm. Cortical thinning. Normal cortical thickness and echogenicity. No mass or hydronephrosis. Abdominal aorta: Obscured by bowel gas Other findings: No free fluid. Limitations of exam secondary to body habitus and inability to suspend respiration. IMPRESSION: Cirrhotic appearing liver. Post cholecystectomy. Nonvisualization of pancreas and aorta. Electronically Signed   By: Lavonia Dana M.D.   On: 07/18/2019 08:45      Labs: BNP (last 3 results) No results for input(s): BNP in the last 8760 hours. Basic Metabolic Panel: Recent Labs  Lab 07/17/19 1147 07/17/19 1401 07/18/19 0402 07/19/19 0347  NA 136  --  143 144  K 5.0  --  4.3 4.7  CL 102  --  114* 113*  CO2 19*  --  21* 25  GLUCOSE  102*  --  124* 129*  BUN 60*  --  37* 21  CREATININE 4.21* 3.98* 1.34* 0.71  CALCIUM 9.0  --  8.6* 9.1  MG  --   --   --  2.3   Liver Function Tests: Recent Labs  Lab 07/17/19 1147 07/18/19 0402  AST 85* 74*  ALT 44 39  ALKPHOS 58 57  BILITOT 1.0 0.7  PROT 7.3 6.3*  ALBUMIN 3.7 3.0*   No results for input(s): LIPASE, AMYLASE in the last 168 hours. Recent Labs  Lab 07/17/19 1401  AMMONIA 15   CBC: Recent Labs  Lab 07/17/19 1147 07/18/19 0402 07/19/19 0347  WBC 7.9 4.0 3.6*  NEUTROABS 6.2  --   --   HGB 13.2 12.1 12.1  HCT 41.0 37.3 37.4  MCV 95.8 96.1 95.9  PLT 183 128* 147*   Cardiac Enzymes: No results for input(s): CKTOTAL, CKMB, CKMBINDEX, TROPONINI in the last 168 hours. BNP: Invalid input(s): POCBNP CBG: Recent Labs  Lab 07/18/19 2116  GLUCAP 131*   D-Dimer No results for input(s): DDIMER in the last 72 hours. Hgb A1c No results for input(s): HGBA1C in the last 72 hours. Lipid Profile No results for input(s): CHOL, HDL, LDLCALC, TRIG, CHOLHDL, LDLDIRECT in the last 72 hours. Thyroid function studies No results for input(s): TSH, T4TOTAL, T3FREE, THYROIDAB in the last 72 hours.  Invalid input(s): FREET3 Anemia work up No results for input(s): VITAMINB12, FOLATE, FERRITIN, TIBC, IRON, RETICCTPCT in the last 72 hours. Urinalysis    Component Value Date/Time   COLORURINE YELLOW (A) 07/17/2019 1452   APPEARANCEUR HAZY (A) 07/17/2019 1452   LABSPEC 1.011 07/17/2019 1452   PHURINE 5.0 07/17/2019 1452   GLUCOSEU NEGATIVE 07/17/2019 1452   HGBUR MODERATE (A) 07/17/2019 1452   BILIRUBINUR NEGATIVE 07/17/2019 1452   BILIRUBINUR neg 11/12/2012 1451   KETONESUR NEGATIVE 07/17/2019 1452   PROTEINUR 30 (A) 07/17/2019 1452   UROBILINOGEN 0.2 11/12/2012 1451   NITRITE NEGATIVE 07/17/2019 1452   LEUKOCYTESUR SMALL (A) 07/17/2019 1452   Sepsis Labs Invalid input(s): PROCALCITONIN,  WBC,  LACTICIDVEN Microbiology Recent Results (from the past 240  hour(s))  SARS CORONAVIRUS 2 (TAT 6-24 HRS) Nasopharyngeal Nasopharyngeal Swab     Status: None   Collection Time: 07/17/19  3:16 PM   Specimen: Nasopharyngeal Swab  Result Value Ref Range Status   SARS Coronavirus 2 NEGATIVE NEGATIVE Final  Comment: (NOTE) SARS-CoV-2 target nucleic acids are NOT DETECTED. The SARS-CoV-2 RNA is generally detectable in upper and lower respiratory specimens during the acute phase of infection. Negative results do not preclude SARS-CoV-2 infection, do not rule out co-infections with other pathogens, and should not be used as the sole basis for treatment or other patient management decisions. Negative results must be combined with clinical observations, patient history, and epidemiological information. The expected result is Negative. Fact Sheet for Patients: SugarRoll.be Fact Sheet for Healthcare Providers: https://www.woods-mathews.com/ This test is not yet approved or cleared by the [place] FDA and  has been authorized for detection and/or diagnosis of SARS-CoV-2 by FDA under an Emergency Use Authorization (EUA). This EUA will remain  in effect (meaning this test can be used) for the duration of the COVID-19 declaration under Section 56 4(b)(1) of the Act, 21 U.S.C. section 360bbb-3(b)(1), unless the authorization is terminated or revoked sooner. Performed at [facility], [place] [address], [place], [place] [address]   Urine Culture     Status: Abnormal   Collection Time: 07/17/19  3:16 PM   Specimen: Urine, Clean Catch  Result Value Ref Range Status   Specimen Description URINE, CLEAN CATCH  Final   Special Requests   Final    NONE Performed at [facility], [address], [place], [place] [address]    Culture (A)  Final    20,000 COLONIES/mL MULTIPLE SPECIES PRESENT, SUGGEST RECOLLECTION   Report Status 07/18/2019 FINAL  Final     Total time spend on discharging this patient,  including the last patient exam, discussing the hospital stay, instructions for ongoing care as it relates to all pertinent caregivers, as well as preparing the medical discharge records, prescriptions, and/or referrals as applicable, is 45 minutes.    Enzo Bi, MD  [facility] 07/19/2019, 9:38 AM  If 7PM-7AM, please contact night-coverage

## 2020-12-02 ENCOUNTER — Emergency Department: Payer: Medicare Other

## 2020-12-02 ENCOUNTER — Encounter: Payer: Self-pay | Admitting: Emergency Medicine

## 2020-12-02 ENCOUNTER — Inpatient Hospital Stay
Admission: EM | Admit: 2020-12-02 | Discharge: 2020-12-15 | DRG: 917 | Disposition: A | Discharge status: Skilled Nursing Facility | Payer: Medicare Other | Attending: Internal Medicine | Admitting: Internal Medicine

## 2020-12-02 ENCOUNTER — Other Ambulatory Visit: Payer: Self-pay

## 2020-12-02 ENCOUNTER — Inpatient Hospital Stay: Payer: Medicare Other

## 2020-12-02 DIAGNOSIS — F32A Depression, unspecified: Secondary | ICD-10-CM | POA: Diagnosis present

## 2020-12-02 DIAGNOSIS — K7581 Nonalcoholic steatohepatitis (NASH): Secondary | ICD-10-CM | POA: Diagnosis present

## 2020-12-02 DIAGNOSIS — M1990 Unspecified osteoarthritis, unspecified site: Secondary | ICD-10-CM | POA: Diagnosis present

## 2020-12-02 DIAGNOSIS — T50911A Poisoning by multiple unspecified drugs, medicaments and biological substances, accidental (unintentional), initial encounter: Secondary | ICD-10-CM | POA: Diagnosis present

## 2020-12-02 DIAGNOSIS — K7460 Unspecified cirrhosis of liver: Secondary | ICD-10-CM | POA: Diagnosis present

## 2020-12-02 DIAGNOSIS — F0390 Unspecified dementia without behavioral disturbance: Secondary | ICD-10-CM | POA: Diagnosis present

## 2020-12-02 DIAGNOSIS — Z825 Family history of asthma and other chronic lower respiratory diseases: Secondary | ICD-10-CM

## 2020-12-02 DIAGNOSIS — Z8673 Personal history of transient ischemic attack (TIA), and cerebral infarction without residual deficits: Secondary | ICD-10-CM

## 2020-12-02 DIAGNOSIS — I480 Paroxysmal atrial fibrillation: Secondary | ICD-10-CM | POA: Diagnosis not present

## 2020-12-02 DIAGNOSIS — D696 Thrombocytopenia, unspecified: Secondary | ICD-10-CM | POA: Diagnosis not present

## 2020-12-02 DIAGNOSIS — G928 Other toxic encephalopathy: Secondary | ICD-10-CM | POA: Diagnosis present

## 2020-12-02 DIAGNOSIS — Z79811 Long term (current) use of aromatase inhibitors: Secondary | ICD-10-CM

## 2020-12-02 DIAGNOSIS — Z20822 Contact with and (suspected) exposure to covid-19: Secondary | ICD-10-CM | POA: Diagnosis present

## 2020-12-02 DIAGNOSIS — Z4659 Encounter for fitting and adjustment of other gastrointestinal appliance and device: Secondary | ICD-10-CM

## 2020-12-02 DIAGNOSIS — Z853 Personal history of malignant neoplasm of breast: Secondary | ICD-10-CM

## 2020-12-02 DIAGNOSIS — K589 Irritable bowel syndrome without diarrhea: Secondary | ICD-10-CM | POA: Diagnosis present

## 2020-12-02 DIAGNOSIS — K7290 Hepatic failure, unspecified without coma: Secondary | ICD-10-CM | POA: Diagnosis present

## 2020-12-02 DIAGNOSIS — E039 Hypothyroidism, unspecified: Secondary | ICD-10-CM | POA: Diagnosis present

## 2020-12-02 DIAGNOSIS — J45909 Unspecified asthma, uncomplicated: Secondary | ICD-10-CM | POA: Diagnosis present

## 2020-12-02 DIAGNOSIS — G2581 Restless legs syndrome: Secondary | ICD-10-CM | POA: Diagnosis present

## 2020-12-02 DIAGNOSIS — M7989 Other specified soft tissue disorders: Secondary | ICD-10-CM | POA: Diagnosis present

## 2020-12-02 DIAGNOSIS — Z885 Allergy status to narcotic agent status: Secondary | ICD-10-CM

## 2020-12-02 DIAGNOSIS — M5416 Radiculopathy, lumbar region: Secondary | ICD-10-CM | POA: Diagnosis present

## 2020-12-02 DIAGNOSIS — E870 Hyperosmolality and hypernatremia: Secondary | ICD-10-CM | POA: Diagnosis not present

## 2020-12-02 DIAGNOSIS — R4189 Other symptoms and signs involving cognitive functions and awareness: Secondary | ICD-10-CM | POA: Diagnosis present

## 2020-12-02 DIAGNOSIS — F419 Anxiety disorder, unspecified: Secondary | ICD-10-CM | POA: Diagnosis present

## 2020-12-02 DIAGNOSIS — Z79899 Other long term (current) drug therapy: Secondary | ICD-10-CM

## 2020-12-02 DIAGNOSIS — Z8261 Family history of arthritis: Secondary | ICD-10-CM

## 2020-12-02 DIAGNOSIS — E860 Dehydration: Secondary | ICD-10-CM | POA: Diagnosis not present

## 2020-12-02 DIAGNOSIS — I4891 Unspecified atrial fibrillation: Secondary | ICD-10-CM | POA: Diagnosis not present

## 2020-12-02 DIAGNOSIS — Z978 Presence of other specified devices: Secondary | ICD-10-CM

## 2020-12-02 DIAGNOSIS — Z91048 Other nonmedicinal substance allergy status: Secondary | ICD-10-CM

## 2020-12-02 DIAGNOSIS — Z515 Encounter for palliative care: Secondary | ICD-10-CM

## 2020-12-02 DIAGNOSIS — Z820 Family history of epilepsy and other diseases of the nervous system: Secondary | ICD-10-CM

## 2020-12-02 DIAGNOSIS — G8929 Other chronic pain: Secondary | ICD-10-CM | POA: Diagnosis present

## 2020-12-02 DIAGNOSIS — E785 Hyperlipidemia, unspecified: Secondary | ICD-10-CM | POA: Diagnosis present

## 2020-12-02 DIAGNOSIS — K219 Gastro-esophageal reflux disease without esophagitis: Secondary | ICD-10-CM | POA: Diagnosis present

## 2020-12-02 DIAGNOSIS — Z803 Family history of malignant neoplasm of breast: Secondary | ICD-10-CM

## 2020-12-02 DIAGNOSIS — Z66 Do not resuscitate: Secondary | ICD-10-CM | POA: Diagnosis not present

## 2020-12-02 DIAGNOSIS — G9341 Metabolic encephalopathy: Secondary | ICD-10-CM | POA: Diagnosis not present

## 2020-12-02 DIAGNOSIS — Z9012 Acquired absence of left breast and nipple: Secondary | ICD-10-CM

## 2020-12-02 DIAGNOSIS — T50901A Poisoning by unspecified drugs, medicaments and biological substances, accidental (unintentional), initial encounter: Secondary | ICD-10-CM

## 2020-12-02 DIAGNOSIS — I10 Essential (primary) hypertension: Secondary | ICD-10-CM | POA: Diagnosis present

## 2020-12-02 DIAGNOSIS — I872 Venous insufficiency (chronic) (peripheral): Secondary | ICD-10-CM | POA: Diagnosis present

## 2020-12-02 DIAGNOSIS — Z79890 Hormone replacement therapy (postmenopausal): Secondary | ICD-10-CM

## 2020-12-02 DIAGNOSIS — E876 Hypokalemia: Secondary | ICD-10-CM | POA: Diagnosis not present

## 2020-12-02 DIAGNOSIS — K1120 Sialoadenitis, unspecified: Secondary | ICD-10-CM | POA: Diagnosis not present

## 2020-12-02 DIAGNOSIS — Z9114 Patient's other noncompliance with medication regimen: Secondary | ICD-10-CM

## 2020-12-02 DIAGNOSIS — Z6837 Body mass index (BMI) 37.0-37.9, adult: Secondary | ICD-10-CM | POA: Diagnosis not present

## 2020-12-02 DIAGNOSIS — J9601 Acute respiratory failure with hypoxia: Secondary | ICD-10-CM | POA: Diagnosis present

## 2020-12-02 DIAGNOSIS — R221 Localized swelling, mass and lump, neck: Secondary | ICD-10-CM | POA: Diagnosis not present

## 2020-12-02 DIAGNOSIS — M797 Fibromyalgia: Secondary | ICD-10-CM | POA: Diagnosis present

## 2020-12-02 DIAGNOSIS — R4182 Altered mental status, unspecified: Secondary | ICD-10-CM | POA: Diagnosis present

## 2020-12-02 LAB — CBC
HCT: 40 % (ref 36.0–46.0)
Hemoglobin: 13.1 g/dL (ref 12.0–15.0)
MCH: 30.8 pg (ref 26.0–34.0)
MCHC: 32.8 g/dL (ref 30.0–36.0)
MCV: 94.1 fL (ref 80.0–100.0)
Platelets: 185 10*3/uL (ref 150–400)
RBC: 4.25 MIL/uL (ref 3.87–5.11)
RDW: 15.8 % — ABNORMAL HIGH (ref 11.5–15.5)
WBC: 4.8 10*3/uL (ref 4.0–10.5)
nRBC: 0 % (ref 0.0–0.2)

## 2020-12-02 LAB — URINE DRUG SCREEN, QUALITATIVE (ARMC ONLY)
Amphetamines, Ur Screen: NOT DETECTED
Barbiturates, Ur Screen: NOT DETECTED
Benzodiazepine, Ur Scrn: NOT DETECTED
Cannabinoid 50 Ng, Ur ~~LOC~~: NOT DETECTED
Cocaine Metabolite,Ur ~~LOC~~: NOT DETECTED
MDMA (Ecstasy)Ur Screen: NOT DETECTED
Methadone Scn, Ur: NOT DETECTED
Opiate, Ur Screen: NOT DETECTED
Phencyclidine (PCP) Ur S: NOT DETECTED
Tricyclic, Ur Screen: NOT DETECTED

## 2020-12-02 LAB — BLOOD GAS, ARTERIAL
Acid-base deficit: 3.3 mmol/L — ABNORMAL HIGH (ref 0.0–2.0)
Bicarbonate: 19.5 mmol/L — ABNORMAL LOW (ref 20.0–28.0)
FIO2: 40
MECHVT: 450 mL
O2 Saturation: 99.3 %
PEEP: 5 cmH2O
Patient temperature: 37
RATE: 12 resp/min
pCO2 arterial: 28 mmHg — ABNORMAL LOW (ref 32.0–48.0)
pH, Arterial: 7.45 (ref 7.350–7.450)
pO2, Arterial: 140 mmHg — ABNORMAL HIGH (ref 83.0–108.0)

## 2020-12-02 LAB — TSH: TSH: 10.165 u[IU]/mL — ABNORMAL HIGH (ref 0.350–4.500)

## 2020-12-02 LAB — COMPREHENSIVE METABOLIC PANEL
ALT: 42 U/L (ref 0–44)
AST: 99 U/L — ABNORMAL HIGH (ref 15–41)
Albumin: 3.5 g/dL (ref 3.5–5.0)
Alkaline Phosphatase: 112 U/L (ref 38–126)
Anion gap: 8 (ref 5–15)
BUN: 15 mg/dL (ref 8–23)
CO2: 23 mmol/L (ref 22–32)
Calcium: 9.8 mg/dL (ref 8.9–10.3)
Chloride: 106 mmol/L (ref 98–111)
Creatinine, Ser: 0.64 mg/dL (ref 0.44–1.00)
GFR, Estimated: >60 mL/min (ref >60)
Glucose, Bld: 141 mg/dL — ABNORMAL HIGH (ref 70–99)
Potassium: 3.8 mmol/L (ref 3.5–5.1)
Sodium: 137 mmol/L (ref 135–145)
Total Bilirubin: 1 mg/dL (ref 0.3–1.2)
Total Protein: 7.4 g/dL (ref 6.5–8.1)

## 2020-12-02 LAB — ETHANOL: Alcohol, Ethyl (B): <10 mg/dL (ref <10)

## 2020-12-02 LAB — RESP PANEL BY RT-PCR (FLU A&B, COVID) ARPGX2
Influenza A by PCR: NEGATIVE
Influenza B by PCR: NEGATIVE
SARS Coronavirus 2 by RT PCR: NEGATIVE

## 2020-12-02 LAB — CBG MONITORING, ED: Glucose-Capillary: 129 mg/dL — ABNORMAL HIGH (ref 70–99)

## 2020-12-02 LAB — GLUCOSE, CAPILLARY: Glucose-Capillary: 135 mg/dL — ABNORMAL HIGH (ref 70–99)

## 2020-12-02 LAB — T4, FREE: Free T4: 1.09 ng/dL (ref 0.61–1.12)

## 2020-12-02 LAB — SALICYLATE LEVEL: Salicylate Lvl: <7 mg/dL — ABNORMAL LOW (ref 7.0–30.0)

## 2020-12-02 LAB — ACETAMINOPHEN LEVEL: Acetaminophen (Tylenol), Serum: <10 ug/mL — ABNORMAL LOW (ref 10–30)

## 2020-12-02 LAB — MRSA NEXT GEN BY PCR, NASAL: MRSA by PCR Next Gen: NOT DETECTED

## 2020-12-02 MED ORDER — FENTANYL CITRATE (PF) 100 MCG/2ML IJ SOLN
INTRAMUSCULAR | Status: AC | PRN
  Administered 2020-12-02: 100 ug via INTRAVENOUS

## 2020-12-02 MED ORDER — PROPOFOL 1000 MG/100ML IV EMUL
INTRAVENOUS | Status: AC | PRN
  Administered 2020-12-02: 5 ug/kg/min via INTRAVENOUS

## 2020-12-02 MED ORDER — ETOMIDATE 2 MG/ML IV SOLN: 10.0000 mg | Freq: Once | INTRAVENOUS | Status: DC

## 2020-12-02 MED ORDER — ROCURONIUM BROMIDE 50 MG/5ML IV SOLN
INTRAVENOUS | Status: AC | PRN
  Administered 2020-12-02: 100 mg via INTRAVENOUS

## 2020-12-02 MED ORDER — POLYETHYLENE GLYCOL 3350 17 G PO PACK
17.0000 g | PACK | Freq: Every day | ORAL | Status: DC
  Administered 2020-12-03: 17 g
  Filled 2020-12-02: qty 1

## 2020-12-02 MED ORDER — FENTANYL 2500MCG IN NS 250ML (10MCG/ML) PREMIX INFUSION: 25.0000 ug/h | INTRAVENOUS | Status: DC

## 2020-12-02 MED ORDER — SODIUM CHLORIDE 0.9 % IV BOLUS
1000.0000 mL | Freq: Once | INTRAVENOUS | Status: AC
  Administered 2020-12-02: 1000 mL via INTRAVENOUS

## 2020-12-02 MED ORDER — ETOMIDATE 2 MG/ML IV SOLN
INTRAVENOUS | Status: AC | PRN
  Administered 2020-12-02: 20 mg via INTRAVENOUS

## 2020-12-02 MED ORDER — NALOXONE HCL 2 MG/2ML IJ SOSY
1.0000 mg | PREFILLED_SYRINGE | Freq: Once | INTRAMUSCULAR | Status: AC
  Administered 2020-12-02: 1 mg via INTRAVENOUS

## 2020-12-02 MED ORDER — FENTANYL 2500MCG IN NS 250ML (10MCG/ML) PREMIX INFUSION
100.0000 ug/h | INTRAVENOUS | Status: DC
  Administered 2020-12-02 (×2): 100 ug/h via INTRAVENOUS
  Filled 2020-12-02: qty 250

## 2020-12-02 MED ORDER — NALOXONE HCL 0.4 MG/ML IJ SOLN
0.2000 mg | Freq: Once | INTRAMUSCULAR | Status: AC
  Administered 2020-12-02: 0.2 mg via INTRAVENOUS
  Filled 2020-12-02: qty 1

## 2020-12-02 MED ORDER — ONDANSETRON HCL 4 MG/2ML IJ SOLN
4.0000 mg | Freq: Once | INTRAMUSCULAR | Status: AC
  Administered 2020-12-02: 4 mg via INTRAVENOUS

## 2020-12-02 MED ORDER — ETOMIDATE 2 MG/ML IV SOLN: 20.0000 mg | Freq: Once | INTRAVENOUS | Status: DC

## 2020-12-02 MED ORDER — POLYETHYLENE GLYCOL 3350 17 G PO PACK: 17.0000 g | PACK | Freq: Every day | ORAL | Status: DC | PRN

## 2020-12-02 MED ORDER — ORAL CARE MOUTH RINSE
15.0000 mL | OROMUCOSAL | Status: DC
  Administered 2020-12-02 – 2020-12-09 (×60): 15 mL via OROMUCOSAL
  Filled 2020-12-02 (×5): qty 15

## 2020-12-02 MED ORDER — HEPARIN SODIUM (PORCINE) 5000 UNIT/ML IJ SOLN
5000.0000 [IU] | Freq: Three times a day (TID) | INTRAMUSCULAR | Status: DC
  Administered 2020-12-03 – 2020-12-05 (×8): 5000 [IU] via SUBCUTANEOUS
  Filled 2020-12-02 (×8): qty 1

## 2020-12-02 MED ORDER — CHLORHEXIDINE GLUCONATE 0.12% ORAL RINSE (MEDLINE KIT)
15.0000 mL | Freq: Two times a day (BID) | OROMUCOSAL | Status: DC
  Administered 2020-12-02 – 2020-12-14 (×22): 15 mL via OROMUCOSAL
  Filled 2020-12-02 (×2): qty 15

## 2020-12-02 MED ORDER — ONDANSETRON HCL 4 MG/2ML IJ SOLN
INTRAMUSCULAR | Status: AC
  Filled 2020-12-02: qty 2

## 2020-12-02 MED ORDER — HEPARIN SODIUM (PORCINE) 5000 UNIT/ML IJ SOLN: 5000.0000 [IU] | Freq: Three times a day (TID) | INTRAMUSCULAR | Status: DC

## 2020-12-02 MED ORDER — ROCURONIUM BROMIDE 50 MG/5ML IV SOLN
100.0000 mg | Freq: Once | INTRAVENOUS | Status: DC
  Filled 2020-12-02: qty 10

## 2020-12-02 MED ORDER — SODIUM CHLORIDE 0.9 % IV BOLUS
1000.0000 mL | Freq: Once | INTRAVENOUS | Status: DC
Start: 2020-12-02 — End: 2020-12-02

## 2020-12-02 MED ORDER — IOHEXOL 350 MG/ML SOLN
75.0000 mL | Freq: Once | INTRAVENOUS | Status: AC | PRN
  Administered 2020-12-02: 75 mL via INTRAVENOUS

## 2020-12-02 MED ORDER — DOCUSATE SODIUM 100 MG PO CAPS: 100.0000 mg | ORAL_CAPSULE | Freq: Two times a day (BID) | ORAL | Status: DC | PRN

## 2020-12-02 MED ORDER — PROPOFOL 1000 MG/100ML IV EMUL
5.0000 ug/kg/min | INTRAVENOUS | Status: DC
  Filled 2020-12-02: qty 100

## 2020-12-02 MED ORDER — NALOXONE HCL 2 MG/2ML IJ SOSY
PREFILLED_SYRINGE | INTRAMUSCULAR | Status: AC
  Filled 2020-12-02: qty 2

## 2020-12-02 MED ORDER — DOCUSATE SODIUM 50 MG/5ML PO LIQD
100.0000 mg | Freq: Two times a day (BID) | ORAL | Status: DC
  Administered 2020-12-03 (×2): 100 mg
  Filled 2020-12-02 (×2): qty 10

## 2020-12-02 MED ORDER — CHLORHEXIDINE GLUCONATE CLOTH 2 % EX PADS
6.0000 | MEDICATED_PAD | Freq: Every day | CUTANEOUS | Status: DC
  Administered 2020-12-02 – 2020-12-10 (×7): 6 via TOPICAL
  Filled 2020-12-02: qty 6

## 2020-12-02 MED ORDER — PANTOPRAZOLE SODIUM 40 MG IV SOLR
40.0000 mg | Freq: Every day | INTRAVENOUS | Status: DC
  Administered 2020-12-02 – 2020-12-03 (×2): 40 mg via INTRAVENOUS
  Filled 2020-12-02 (×2): qty 40

## 2020-12-02 NOTE — ED Triage Notes (Signed)
Pt comes into the ED via [facility] from [place] c/o AMS.  Pt has been forgetting things, having near syncopal episodes, drowsy, A&Ox2 which is not baseline.    CBG 107 97 HR, NSR 90% RA, put on 1L [place] and now at 95% 188/102

## 2020-12-02 NOTE — ED Notes (Signed)
Pt being transported by Elana, RN and Debby, RT now

## 2020-12-02 NOTE — Progress Notes (Signed)
SBT tried on patient per NP while NP was at bedside.  Patient was placed on PSV 5/5.  Patient failed SBT trial due to respirations remaining below 10b/min.  Patient placed back on full ventilatory support with previous vent settings.

## 2020-12-02 NOTE — ED Provider Notes (Signed)
[facility] ([place]) Emergency Department Provider Note ____________________________________________   Event Date/Time   First MD Initiated Contact with Patient 12/02/20 1737     (approximate)  I have reviewed the triage vital signs and the nursing notes.  HISTORY  Chief Complaint Altered Mental Status   HPI Gwendolyn [name] is a 75 y.o. femalewho presents to the ED for evaluation of altered mentation and somnolence.  Chart review indicates history of depression, cognitive impairment, leg swelling, chronic lumbar radiculopathy and osteoarthritis and Suboxone.  Medications include pregabalin 50 mg tablets, potassium supplementation and Lasix 20 mg, duloxetine 20 mg, buprenorphine 2 mg, Synthroid 50 mcg, enalapril 10 mg  Patient presents to the ED, accompanied by her roommate, friend and medical POA Katie.  Joellen Jersey provides all history as patient is somnolent, confused and cannot provide any relevant history.  Katie reports knowing the patient for many years as Joellen Jersey was a home health aide that helped with patient's husband previously.  Katie reports that patient recently moved in with her, about 1 week ago.  She reports that patient has not been taking her medications, for an unknown timeframe, and just restarted taking them this afternoon at about 3 PM.  Joellen Jersey reports that patient took the appropriate doses of all of her medications without concern for intentional overdose, doubling of any medications, as Katie prepared the daily medicine box for the patient.  About 20 minutes after patient took her medications, she became quite confused, sleepy and altered.  Katie therefore brings patient to the ER for evaluation.  Past Medical History:  Diagnosis Date   Anxiety    Arthritis    Asthma    Breast cancer ([place]) 01/2010   left mastectomy, LN neg, on femara   Chronic low back pain    Cirrhosis of liver (HCC)    Depression    Fibromyalgia    GERD (gastroesophageal reflux  disease)    History of chicken pox    HTN (hypertension)    Hyperlipidemia    Hypothyroidism    IBS (irritable bowel syndrome)    Migraines    NASH (nonalcoholic steatohepatitis)    Followed by Dr. Gerald Dexter at [facility]   PONV (postoperative nausea and vomiting)    RLS (restless legs syndrome)    Seasonal allergies    Trigeminal neuralgia     Patient Active Problem List   Diagnosis Date Noted   Acute respiratory failure with hypoxia ([place]) 26/71/2458   Metabolic encephalopathy 09/98/3382   Cirrhosis of liver ([place])    Cerebrovascular disease    ARF (acute renal failure) ([place]) 01/09/2018   Chronic venous insufficiency 11/28/2017   Lymphedema 11/28/2017   Leg pain 11/28/2017   GERD (gastroesophageal reflux disease) 11/28/2017   Unspecified hypothyroidism 12/10/2013   Medicare annual wellness visit, subsequent 07/08/2013   Weakness of both legs 07/08/2013   Night sweats 01/16/2013   Tremor 01/16/2013   Palpitations 01/16/2013   Fibromyalgia 07/22/2012   Essential hypertension, benign 06/19/2012   Bereavement 06/19/2012   Depression 03/28/2012   Insomnia 03/28/2012   Lumbar back pain 12/22/2011   HX: breast cancer 09/18/2011   Abnormal CT lung screening 08/07/2011   NASH (nonalcoholic steatohepatitis) 06/12/2011    Past Surgical History:  Procedure Laterality Date   ABDOMINAL HYSTERECTOMY     ANKLE SURGERY  2006   BREAST BIOPSY  2011   [place]   CHOLECYSTECTOMY  2005   CYSTOCELE REPAIR     DILATION AND CURETTAGE OF UTERUS  GASTRIC BYPASS  2008   KNEE ARTHROSCOPY Left 09/17/2018   Procedure: KNEE ARTHROSCOPY WITH DEBRIDEMENT, PARTIAL MEDIAL MENISCECTOMY AND SUB-CHONDROPLASTY OF A BONE MARROW LESION INVOLVING THE MEIDAL TIBIAL PLATEAU;  Surgeon: Corky Mull, MD;  Location: [facility] ORS;  Service: Orthopedics;  Laterality: Left;   MASTECTOMY  2011   Breast Cancer   RECTOCELE REPAIR     ROTATOR CUFF REPAIR  2001   SEPTOPLASTY     TONSILLECTOMY  age 42   TOTAL ABDOMINAL  HYSTERECTOMY W/ BILATERAL SALPINGOOPHORECTOMY  1991   VAGINAL DELIVERY     x3    Prior to Admission medications   Medication Sig Start Date End Date Taking? Authorizing Provider  buprenorphine (SUBUTEX) 2 MG SUBL SL tablet Place 4 mg under the tongue daily.   Yes [provider]  calcium carbonate (OS-CAL - DOSED IN MG OF ELEMENTAL CALCIUM) 1250 (500 Ca) MG tablet Take 1 tablet by mouth daily.   Yes [provider]  Cholecalciferol (VITAMIN D) 50 MCG (2000 UT) tablet Take 2,000 Units by mouth daily.   Yes [provider]  DULoxetine (CYMBALTA) 20 MG capsule Take 20 mg by mouth daily.   Yes [provider]  enalapril (VASOTEC) 10 MG tablet Hold this until outpatient PCP followup, because you had low blood pressure and kidney injury when you presented to the hospital. 07/19/19  Yes Enzo Bi, MD  escitalopram (LEXAPRO) 20 MG tablet Take 20 mg by mouth daily.  07/19/17  Yes [provider]  levothyroxine (SYNTHROID, LEVOTHROID) 50 MCG tablet Take 1 tablet (50 mcg total) by mouth daily. 07/08/13  Yes Jackolyn Confer, MD  omeprazole (PRILOSEC) 20 MG capsule Take 20 mg by mouth daily.   Yes [provider]  pregabalin (LYRICA) 50 MG capsule Take 50-100 mg by mouth 3 (three) times daily.   Yes [provider]  rOPINIRole (REQUIP) 2 MG tablet Take 2 mg by mouth 3 (three) times daily.   Yes [provider]  acetaminophen (TYLENOL) 500 MG tablet Take 1,000 mg by mouth every 6 (six) hours as needed for moderate pain or headache.    [provider]  albuterol (PROVENTIL HFA;VENTOLIN HFA) 108 (90 Base) MCG/ACT inhaler Inhale 2 puffs into the lungs every 6 (six) hours as needed for wheezing or shortness of breath.    [provider]  furosemide (LASIX) 20 MG tablet Take 20 mg by mouth daily as needed for fluid or edema.    [provider]  potassium citrate (UROCIT-K) 10 MEQ (1080 MG) SR tablet Take 10 mEq by mouth  daily. When taking LASIX    [provider]    Allergies Codeine, Oxycodone, and Silver  Family History  Problem Relation Age of Onset   COPD Mother    Alzheimer's disease Father    Cancer Sister 71       Breast   Arthritis Maternal Grandmother    Arthritis Maternal Grandfather     Social History Social History   Tobacco Use   Smoking status: Never   Smokeless tobacco: Never  Vaping Use   Vaping Use: Never used  Substance Use Topics   Alcohol use: Not Currently    Comment: Occasional   Drug use: No    Review of Systems  Unable to be assessed due to patient's altered mentation and somnolence. ____________________________________________   PHYSICAL EXAM:  VITAL SIGNS: Vitals:   12/02/20 1845 12/02/20 1900  BP: 130/63 125/68  Pulse: (!) 106 97  Resp: Marland Kitchen)  23 (!) 23  Temp:    SpO2: 100% 100%    Constitutional: Somnolent without distress.  Awakens briefly to loud verbal and to noxious stimuli, but quickly falls back asleep with sonorous respirations.  She does follow simple commands in all 4 extremities such as wiggling toes and thumbs up. Eyes: Conjunctivae are normal. EOMI. Pupils are initially quite small and pinpoint, this improved with Narcan, but mental status not significantly improved alongside this.  PERRL Head: Atraumatic. Nose: No congestion/rhinnorhea. Mouth/Throat: Mucous membranes are moist.  Oropharynx non-erythematous. Neck: No stridor. No cervical spine tenderness to palpation. Cardiovascular: Tachycardic rate, regular rhythm. Grossly normal heart sounds.  Good peripheral circulation. Respiratory: Bradypnea with respiratory rate of about 6-8 with periods of apnea in between rectified with sternal rub Gastrointestinal: Soft , nondistended, nontender to palpation. No CVA tenderness. Musculoskeletal: No lower extremity tenderness nor edema.  No joint effusions. No signs of acute trauma. Neurologic: Somnolent without focal neurologic deficit.   Follows simple commands in all 4. Skin:  Skin is warm, dry and intact. No rash noted. Psychiatric: Mood and affect are difficult to assess due to altered mentation ____________________________________________   LABS (all labs ordered are listed, but only abnormal results are displayed)  Labs Reviewed  COMPREHENSIVE METABOLIC PANEL - Abnormal; Notable for the following components:      Result Value   Glucose, Bld 141 (*)    AST 99 (*)    All other components within normal limits  CBC - Abnormal; Notable for the following components:   RDW 15.8 (*)    All other components within normal limits  CBG MONITORING, ED - Abnormal; Notable for the following components:   Glucose-Capillary 129 (*)    All other components within normal limits  RESP PANEL BY RT-PCR (FLU A&B, COVID) ARPGX2  URINE DRUG SCREEN, QUALITATIVE ([facility] ONLY)  ETHANOL  SALICYLATE LEVEL  ACETAMINOPHEN LEVEL  BLOOD GAS, ARTERIAL  BASIC METABOLIC PANEL  CBC  PHOSPHORUS  MAGNESIUM   ____________________________________________  12 Lead EKG  Sinus tachycardia with rate of 121 bpm.  Normal axis and intervals.  QTc 480.  Nonspecific ST changes laterally and inferiorly without STEMI.  1 PVC. ____________________________________________  RADIOLOGY  ED MD interpretation:  1 vw cxr reviewed by me with ett in good position  Official radiology report(s): DG Chest Port 1 View  Result Date: 12/02/2020 CLINICAL DATA:  Intubation and NG tube placement EXAM: PORTABLE CHEST 1 VIEW COMPARISON:  Radiograph 07/17/2019 FINDINGS: Endotracheal tube overlies the midthoracic trachea. Nasogastric tube passes below the diaphragm, tip overlying the stomach. Small left pleural effusion. Left medial basilar opacities. No visible pneumothorax. Bilateral shoulder degenerative changes. Either subperiosteal resorption or prior right distal clavicle excision. IMPRESSION: Endotracheal tube overlies the midthoracic trachea. Nasogastric tube tip  overlies the stomach. Left medial basilar opacities which could be atelectasis or aspiration/infection. Small left pleural effusion. Unchanged cardiomegaly. Electronically Signed   By: Maurine Simmering M.D.   On: 12/02/2020 19:04    ____________________________________________   PROCEDURES and INTERVENTIONS  Procedure(s) performed (including Critical Care):  .1-3 Lead EKG Interpretation  Date/Time: 12/02/2020 6:42 PM Performed by: Vladimir Crofts, MD Authorized by: Vladimir Crofts, MD     Interpretation: abnormal     ECG rate:  106   ECG rate assessment: tachycardic     Rhythm: sinus tachycardia     Ectopy: none     Conduction: normal   .Critical Care  Date/Time: 12/02/2020 6:42 PM Performed by: Vladimir Crofts, MD Authorized by: Vladimir Crofts, MD  Critical care provider statement:    Critical care time (minutes):  45   Critical care was necessary to treat or prevent imminent or life-threatening deterioration of the following conditions:  Respiratory failure   Critical care was time spent personally by me on the following activities:  Discussions with consultants, evaluation of patient's response to treatment, examination of patient, ordering and performing treatments and interventions, ordering and review of laboratory studies, ordering and review of radiographic studies, pulse oximetry, re-evaluation of patient's condition, obtaining history from patient or surrogate and review of old charts Procedure Name: Intubation Date/Time: 12/02/2020 6:42 PM Performed by: Vladimir Crofts, MD Pre-anesthesia Checklist: Patient identified, Patient being monitored, Emergency Drugs available, Timeout performed and Suction available Oxygen Delivery Method: Non-rebreather mask Preoxygenation: Pre-oxygenation with 100% oxygen Induction Type: Rapid sequence Ventilation: Mask ventilation without difficulty Laryngoscope Size: Glidescope and 3 Tube size: 7.5 mm Number of attempts: 1 Airway Equipment and Method:  Rigid stylet Placement Confirmation: ETT inserted through vocal cords under direct vision, CO2 detector and Breath sounds checked- equal and bilateral Secured at: 22 cm Tube secured with: ETT holder Comments: Uncomplicated     Medications  rocuronium (ZEMURON) injection 100 mg (100 mg Intravenous Not Given 12/02/20 1819)  fentaNYL 2580mg in NS 2574m(103mml) infusion-PREMIX (100 mcg/hr Intravenous Infusion Verify 12/02/20 1900)  propofol (DIPRIVAN) 1000 MG/100ML infusion (5 mcg/kg/min  99.6 kg Intravenous Not Given 12/02/20 1846)  etomidate (AMIDATE) injection 20 mg (20 mg Intravenous Not Given 12/02/20 1845)  docusate sodium (COLACE) capsule 100 mg (has no administration in time range)  polyethylene glycol (MIRALAX / GLYCOLAX) packet 17 g (has no administration in time range)  pantoprazole (PROTONIX) injection 40 mg (has no administration in time range)  naloxone (NARCAN) injection 0.2 mg (0.2 mg Intravenous Given 12/02/20 1730)  naloxone (NARCAN) injection 1 mg (1 mg Intravenous Given by Other 12/02/20 1753)  naloxone (NARCAN) injection 1 mg (1 mg Intravenous Given by Other 12/02/20 1756)  sodium chloride 0.9 % bolus 1,000 mL (1,000 mLs Intravenous New Bag/Given 12/02/20 1801)  ondansetron (ZOFRAN) injection 4 mg (4 mg Intravenous Given 12/02/20 1814)  etomidate (AMIDATE) injection (20 mg Intravenous Given 12/02/20 1819)  rocuronium (ZEMURON) injection (100 mg Intravenous Given 12/02/20 1819)  fentaNYL (SUBLIMAZE) injection (100 mcg Intravenous Not Given 12/02/20 1826)  propofol (DIPRIVAN) 1000 MG/100ML infusion (10 mcg/kg/min  99.6 kg Intravenous Rate/Dose Change 12/02/20 1858)  sodium chloride 0.9 % bolus 1,000 mL (1,000 mLs Intravenous New Bag/Given 12/02/20 1830)  iohexol (OMNIPAQUE) 350 MG/ML injection 75 mL (75 mLs Intravenous Contrast Given 12/02/20 1955)    ____________________________________________   MDM / ED COURSE   [age]ar old woman recently restarting all of her medications  presents to the ED somnolent with hypoxic respiratory failure requiring mechanical ventilation and ICU admission to metabolize.  She was hypoxic on room air requiring 6 L nasal cannula and frequent sternal rubs and stimulation to keep her awake and keep breathing.  I placed an NPA with almost no fighting from the patient.  She was provided 3 mg of Narcan with minimal to no improvement of her symptoms.  I suspect accidental polysubstance overdose with the story provided by her medical POA at the bedside.  Intubated for airway protection and respiratory failure.  Basic labs are unremarkable.  No evidence of hypoglycemia.  We will CT her head to ensure no intracranial pathology, though I have a low suspicion for this.  We will admit to the ICU for further work-up and management.   Clinical Course as  of 12/02/20 1841  Thu Dec 02, 2020  1805 Discussion with medical POA and friend at the bedside about my concerns for periods of apnea, sonorous respirations and poor mental status.  We discussed that she may need to be put on a ventilator to help breathe through her until metabolizes medications.  Patient has been provided 3 mg of Narcan at this point without significant improvement of her symptoms.  I suspect the opiate agonism component of this has been addressed, but her other medications are sedating her and I discussed not having a specific reversal agent for his medications with her medical POA.  Medical POA is in agreement with intubation if necessary. [DS]  0175 Upon my reassessment she continues to have long periods of apnea with a respiratory rate of around 6-8.  Requiring frequent stimulation.  I discussed with Joellen Jersey, the medical POA at the bedside, my recommendations for intubation.  She is in agreement. [DS]  1025 Intubated without difficulty [DS]  8527 Reassessed.  Blood pressure improving with IV fluids. [DS]    Clinical Course User Index [DS] Vladimir Crofts, MD     ____________________________________________   FINAL CLINICAL IMPRESSION(S) / ED DIAGNOSES  Final diagnoses:  Acute respiratory failure with hypoxia ([place])  Accidental drug overdose, initial encounter     ED Discharge Orders     None        Arthurine Oleary Tamala Julian   Note:  This document was prepared using Dragon voice recognition software and may include unintentional dictation errors.    Vladimir Crofts, MD 12/02/20 [phone]

## 2020-12-02 NOTE — ED Notes (Addendum)
Pt more lethargic and Tamala Julian, MD preparing to intubate pt now.

## 2020-12-02 NOTE — ED Notes (Addendum)
1 mg of narcan given by [employer]

## 2020-12-02 NOTE — H&P (Addendum)
NAME:  Gwendolyn [name], MRN:  [id number], DOB:  [birth year], LOS: 0 ADMISSION DATE:  12/02/2020, CONSULTATION DATE: 12/02/2020 REFERRING MD: Vladimir Crofts, MD, CHIEF COMPLAINT: Altered mental status   HPI  75 y.o female with significant PMH as below who presented to the ED with chief complaints of altered mental status and near syncopal episode.  Patient is currently intubated and sedated, history obtained from patient's POA/friend who is currently at the bedside.  POA, patient recently moved to an assisted living at [facility].  Patient apparently has not been taking her home medication which consist of Lexapro 20 mg, Requip, Cymbalta, Lyrica, and buprenorphine for unknown period of time.  Per POA who manages her medication, today was the first time the patient took her medications.  Patient's POA states that they were walking 30 minutes after she took her medication around 330 when she suddenly complained of feeling dizzy near syncope.   Patient was able to sit down but was soon noted to be somnolent, clammy and incoherent.  POA also stated that patient's eyes appeared to be rolling backwards but did not lose consciousness entirely.  EMS was called and patient was transported to the ED.  ED Course: On arrival to the ED, he was afebrile with blood pressure 130/63 mm Hg and pulse rate 106 beats/min, RR 23 with oxygen saturation sent on 6 L.  There were no obvious focal neurological deficits; She was somnolent requiring frequent sternal rubs and stimulation to keep her awake and breathing.  Patient was placed on n.p.o. with almost no fighting from the patient.  She received 3 mg of Narcan with minimal to no improvement of her symptoms.  Given concerns for possible accidental overdose, patient was intubated for airway protection and respiratory failure.  PCCM consulted for admission to ICU. Initial Labs/Diagnostics WBC/Hgb/Hct/Plts:  4.8/13.1/40.0/185 (08/18 1729)  Glucose 141, AST 99 EKG: Sinus  tachycardia with rate of 121 bpm.  Normal axis and intervals.  QTc 480.  Nonspecific ST changes laterally and inferiorly without STEMI.  1 PVC CXR: Left medial basilar opacity which could be atelectasis or aspiration/infected.  Small left pleural effusion. UA/UDS: Negative Arterial Blood Gas result:  pO2 140; pCO2 28; pH 7.45;  HCO3 19.5, %O2 Sat 99.3  Past Medical History  Liver cirrhosis Chronic venous insufficiency Lymphedema GERD Hypertension Breast cancer Nonalcoholic steatohepatitis Anxiety and depression Asthma Hyperlipidemia Hypothyroidism IBS Migraines RLS Trigeminal neuralgia CVA Significant Hospital Events   8/18: Admitted to ICU with altered mental status secondary to suspected accidental drug overdose  Consults:  PCCM  Procedures:  8/18: Intubation  Significant Diagnostic Tests:  8/18: Chest Xray>Left medial basilar opacities which could be atelectasis or aspiration/infection. Small left pleural effusion. 8/18: Noncontrast CTA head and neck> no acute intracranial abnormality no LVO  Micro Data:  8/18: SARS-CoV-2 PCR> negative 8/18: Influenza PCR> negative  Antimicrobials:  None required  OBJECTIVE  Blood pressure 125/68, pulse 97, temperature 98.3 F (36.8 C), resp. rate (!) 23, height 5' 3"  (1.6 m), weight 99.6 kg, SpO2 100 %.    Vent Mode: AC FiO2 (%):  [40 %] 40 % Set Rate:  [14 bmp] 14 bmp Vt Set:  [450 mL] 450 mL PEEP:  [5 cmH20] 5 cmH20   Intake/Output Summary (Last 24 hours) at 12/02/2020 1937 Last data filed at 12/02/2020 1900 Gross per 24 hour  Intake 5.21 ml  Output --  Net 5.21 ml   Filed Weights   12/02/20 1723  Weight: 99.6 kg  Physical Examination  GENERAL:75 year-old critically ill patient lying in the bed intubated, mechanically ventilated and sedated EYES: Pupils equal, round, reactive to light and accommodation. No scleral icterus. Extraocular muscles intact.  HEENT: Head atraumatic, normocephalic. Oropharynx and  nasopharynx clear.  NECK:  Supple, no jugular venous distention. No thyroid enlargement, no tenderness.  LUNGS: Normal breath sounds bilaterally, no wheezing, rales,rhonchi or crepitation. No use of accessory muscles of respiration.  CARDIOVASCULAR: S1, S2 normal. No murmurs, rubs, or gallops.  ABDOMEN: Soft, nontender, nondistended. Bowel sounds present. No organomegaly or mass.  EXTREMITIES: No pedal edema, cyanosis, or clubbing.  NEUROLOGIC: Cranial nerves II through XII are intact.  Muscle strength not assessed. Sensation intact. Gait not checked.  PSYCHIATRIC: The patient is intubated and sedated SKIN: No obvious rash, lesion, or ulcer.   Labs/imaging that I havepersonally reviewed  (right click and "Reselect all SmartList Selections" daily)     Labs   CBC: Recent Labs  Lab 12/02/20 1729  WBC 4.8  HGB 13.1  HCT 40.0  MCV 94.1  PLT 937    Basic Metabolic Panel: Recent Labs  Lab 12/02/20 1729  NA 137  K 3.8  CL 106  CO2 23  GLUCOSE 141*  BUN 15  CREATININE 0.64  CALCIUM 9.8   GFR: Estimated Creatinine Clearance: 71.5 mL/min (by C-G formula based on SCr of 0.64 mg/dL). Recent Labs  Lab 12/02/20 1729  WBC 4.8    Liver Function Tests: Recent Labs  Lab 12/02/20 1729  AST 99*  ALT 42  ALKPHOS 112  BILITOT 1.0  PROT 7.4  ALBUMIN 3.5   No results for input(s): LIPASE, AMYLASE in the last 168 hours. No results for input(s): AMMONIA in the last 168 hours.  ABG No results found for: PHART, PCO2ART, PO2ART, HCO3, TCO2, ACIDBASEDEF, O2SAT   Coagulation Profile: No results for input(s): INR, PROTIME in the last 168 hours.  Cardiac Enzymes: No results for input(s): CKTOTAL, CKMB, CKMBINDEX, TROPONINI in the last 168 hours.  HbA1C: No results found for: HGBA1C  CBG: Recent Labs  Lab 12/02/20 1752  GLUCAP 129*    Review of Systems:   Unable to assess patient is currently intubated and sedated  Past Medical History  She,  has a past medical  history of Anxiety, Arthritis, Asthma, Breast cancer ([place]) (01/2010), Chronic low back pain, Cirrhosis of liver ([place]), Depression, Fibromyalgia, GERD (gastroesophageal reflux disease), History of chicken pox, HTN (hypertension), Hyperlipidemia, Hypothyroidism, IBS (irritable bowel syndrome), Migraines, NASH (nonalcoholic steatohepatitis), PONV (postoperative nausea and vomiting), RLS (restless legs syndrome), Seasonal allergies, and Trigeminal neuralgia.   Surgical History    Past Surgical History:  Procedure Laterality Date   ABDOMINAL HYSTERECTOMY     ANKLE SURGERY  2006   BREAST BIOPSY  [place]   [place]   CHOLECYSTECTOMY  2005   CYSTOCELE REPAIR     DILATION AND CURETTAGE OF UTERUS     GASTRIC BYPASS  2008   KNEE ARTHROSCOPY Left 09/17/2018   Procedure: KNEE ARTHROSCOPY WITH DEBRIDEMENT, PARTIAL MEDIAL MENISCECTOMY AND SUB-CHONDROPLASTY OF A BONE MARROW LESION INVOLVING THE MEIDAL TIBIAL PLATEAU;  Surgeon: Corky Mull, MD;  Location: [facility] ORS;  Service: Orthopedics;  Laterality: Left;   MASTECTOMY  2011   Breast Cancer   RECTOCELE REPAIR     ROTATOR CUFF REPAIR  2001   SEPTOPLASTY     TONSILLECTOMY  age 48   TOTAL ABDOMINAL HYSTERECTOMY W/ BILATERAL SALPINGOOPHORECTOMY  1991   VAGINAL DELIVERY     x3  Social History   reports that she has never smoked. She has never used smokeless tobacco. She reports that she does not currently use alcohol. She reports that she does not use drugs.   Family History   Her family history includes Alzheimer's disease in her father; Arthritis in her maternal grandfather and maternal grandmother; COPD in her mother; Cancer (age of onset: 61) in her sister.   Allergies Allergies  Allergen Reactions   Codeine Nausea And Vomiting   Oxycodone Itching   Silver Other (See Comments)    (Tegaderm) Blisters.  Okay on hands.     Home Medications  Prior to Admission medications   Medication Sig Start Date End Date Taking? Authorizing Provider   acetaminophen (TYLENOL) 500 MG tablet Take 1,000 mg by mouth every 6 (six) hours as needed for moderate pain or headache.    [provider]  albuterol (PROVENTIL HFA;VENTOLIN HFA) 108 (90 Base) MCG/ACT inhaler Inhale 2 puffs into the lungs every 6 (six) hours as needed for wheezing or shortness of breath.    [provider]  atorvastatin (LIPITOR) 40 MG tablet Take 40 mg by mouth at bedtime.     [provider]  baclofen (LIORESAL) 10 MG tablet Take 10-20 mg by mouth See admin instructions. Take 1 tablet (54m) by mouth every morning and take 2 tablets (243m by mouth every evening    [provider]  calcium carbonate (OS-CAL - DOSED IN MG OF ELEMENTAL CALCIUM) 1250 (500 Ca) MG tablet Take 1 tablet by mouth daily.    [provider]  Cholecalciferol (VITAMIN D) 50 MCG (2000 UT) tablet Take 2,000 Units by mouth daily.    [provider]  enalapril (VASOTEC) 10 MG tablet Hold this until outpatient PCP followup, because you had low blood pressure and kidney injury when you presented to the hospital. 07/19/19   LaEnzo BiMD  escitalopram (LEXAPRO) 20 MG tablet Take 20 mg by mouth daily.  07/19/17   [provider]  fluticasone (FLONASE) 50 MCG/ACT nasal spray Place 1 spray into both nostrils 2 (two) times daily as needed for allergies.     [provider]  fluticasone (FLOVENT HFA) 110 MCG/ACT inhaler Inhale 1 puff into the lungs 2 (two) times daily as needed (shortness of breath).     [provider]  gabapentin (NEURONTIN) 300 MG capsule Take 600 mg by mouth 3 (three) times daily.     [provider]  ibandronate (BONIVA) 150 MG tablet Take 150 mg by mouth every 30 (thirty) days.     [provider]  levothyroxine (SYNTHROID, LEVOTHROID) 50 MCG tablet Take 1 tablet (50 mcg total) by mouth daily. 07/08/13   WaJackolyn ConferMD  metoprolol succinate (TOPROL-XL) 25 MG 24 hr tablet Take 1 tablet (25 mg total)  by mouth daily. 07/08/13   WaJackolyn ConferMD  Multiple Vitamins-Minerals (MULTIVITAMIN WITH MINERALS) tablet Take 1 tablet by mouth daily.    [provider]  omeprazole (PRILOSEC) 20 MG capsule Take 20 mg by mouth daily.     [provider]  oxybutynin (DITROPAN-XL) 10 MG 24 hr tablet Take 10 mg by mouth at bedtime.     [provider]  oxyCODONE (OXY IR/ROXICODONE) 5 MG immediate release tablet Take 1 tablet (5 mg total) by mouth every 6 (six) hours as needed for moderate pain or severe pain. 07/19/19   LaEnzo BiMD  rOPINIRole (REQUIP) 2 MG tablet Take 2 mg by mouth 3 (  three) times daily.    [provider]  vitamin E 1000 UNIT capsule Take 1 tablet by mouth daily.     [provider]    Scheduled Meds:  chlorhexidine gluconate (MEDLINE KIT)  15 mL Mouth Rinse BID   [START ON 12/03/2020] Chlorhexidine Gluconate Cloth  6 each Topical Q0600   etomidate  20 mg Intravenous Once   mouth rinse  15 mL Mouth Rinse 10 times per day   pantoprazole (PROTONIX) IV  40 mg Intravenous QHS   rocuronium  100 mg Intravenous Once   Continuous Infusions:  fentaNYL infusion INTRAVENOUS 100 mcg/hr (12/02/20 1900)   propofol (DIPRIVAN) infusion 10 mcg/kg/min (12/02/20 2251)   PRN Meds:.docusate sodium, polyethylene glycol   Active Hospital Problem list   Acute hypoxic respiratory failure Accidental drug overdose Acute toxic metabolic encephalopathy Elevated LFTs Hypertension Hypothyroidism Anxiety and depression RLS Assessment & Plan:  Acute Hypoxic Respiratory Failure Secondary to Drug Overdose (Accidental ingestion of Cymbalta, Requip, Lexapro, Lyrica and Buprenorphine) possibly causing respiratory depression PMHx: Asthma -Continue ventilator support & lung protective strategies -Wean PEEP & FiO2 as tolerated, maintain SpO2 > 90% -Head of bed elevated 30 degrees, VAP protocol in place -Plateau pressures less than 30 cm H20  -Intermittent chest  x-ray & ABG PRN -Daily WUA with SBT as tolerated  -Ensure adequate pulmonary hygiene  -F/u cultures, trend PCT -Continue Aspiration Pna coverage unasyn -Budesonide inhaler nebs BID, bronchodilators PRN -PAD protocol in place: continue Fentanyl drip & Propofol drip  Acute toxic metabolic encephalopathy Likely in the setting of the above drug side effect Hx: CVA, and mild cognitive impairment -Supportive care as above -CTA head and neck negative for acute intracranial abnormality -Check ammonia levels  Elevated LFTs PMHx: Liver cirrhosis, nonalcoholic steatohepatitis -Trend LFTs  Hypertension -Continue enalapril as BP and renal function permits  Anxiety and depression -Hold home meds, Cymbalta, Lyrica, Cipro and buprenorphine for now  Restless leg syndrome -Hold Requip for  Hypothyroidism Will ad on TSH to previous labs as patient has not been taking her medication for extended period of time -Continue Synthroid   Best practice:  Diet:  NPO Pain/Anxiety/Delirium protocol (if indicated): Yes (RASS goal -1) VAP protocol (if indicated): Yes DVT prophylaxis: Subcutaneous Heparin GI prophylaxis: PPI Glucose control:  SSI Yes Central venous access:  N/A Arterial line:  N/A Foley:  N/A Mobility:  bed rest  PT consulted: N/A Last date of multidisciplinary goals of care discussion [8/18] Code Status:  full code Disposition: ICU   = Goals of Care = Code Status Order: FULL  Primary Emergency Contact: Lorrine Kin Wishes to pursue full aggressive treatment and intervention options, including CPR and intubation,  goals of care will be addressed on going with family if that should become necessary.  Critical care time: 45 minutes     Rufina Falco, DNP, CCRN, FNP-C, AGACNP-BC Acute Care Nurse Practitioner  [facility] Pager: [phone] [place] at [facility]  .

## 2020-12-02 NOTE — ED Notes (Signed)
ED Provider at bedside. 

## 2020-12-02 NOTE — Progress Notes (Addendum)
Spoke with patient's " POA" Gwendolyn [name] who is currently listed as patient's primary contact and "grand daughter".  She clarified to me that she is not the actual granddaughter but she is the patient's long term friend/POA.  Patient has a living biological son Gwendolyn [name] (CELL: [phone]) who per the POA is estranged from the mother.  I attempted to reach the patient son with no answer.  Left a voicemail for patient son to call back.  In the meantime patient's POA was requested to bring  POA documentation/paperwork.  She stated that at the moment she cannot find the paperwork but will attempt to reach out to the lawyer who executed the paperwork.   Gwendolyn Falco, DNP, CCRN, FNP-C, AGACNP-BC Acute Care Nurse Practitioner  [place] Pager: [phone] [place] at [facility]

## 2020-12-02 NOTE — ED Notes (Signed)
Report given to Josias, Therapist, sports.

## 2020-12-03 ENCOUNTER — Encounter: Payer: Self-pay | Admitting: Internal Medicine

## 2020-12-03 DIAGNOSIS — G928 Other toxic encephalopathy: Secondary | ICD-10-CM

## 2020-12-03 DIAGNOSIS — K7460 Unspecified cirrhosis of liver: Secondary | ICD-10-CM | POA: Diagnosis not present

## 2020-12-03 DIAGNOSIS — I480 Paroxysmal atrial fibrillation: Secondary | ICD-10-CM

## 2020-12-03 DIAGNOSIS — J9601 Acute respiratory failure with hypoxia: Secondary | ICD-10-CM | POA: Diagnosis not present

## 2020-12-03 LAB — GLUCOSE, CAPILLARY
Glucose-Capillary: 117 mg/dL — ABNORMAL HIGH (ref 70–99)
Glucose-Capillary: 126 mg/dL — ABNORMAL HIGH (ref 70–99)
Glucose-Capillary: 98 mg/dL (ref 70–99)

## 2020-12-03 LAB — BLOOD GAS, VENOUS
Acid-base deficit: 2.3 mmol/L — ABNORMAL HIGH (ref 0.0–2.0)
Bicarbonate: 22.5 mmol/L (ref 20.0–28.0)
FIO2: 0.35
MECHVT: 400 mL
Mechanical Rate: 16
O2 Saturation: 91.3 %
PEEP: 5 cmH2O
Patient temperature: 37
pCO2, Ven: 38 mmHg — ABNORMAL LOW (ref 44.0–60.0)
pH, Ven: 7.38 (ref 7.250–7.430)
pO2, Ven: 63 mmHg — ABNORMAL HIGH (ref 32.0–45.0)

## 2020-12-03 LAB — BASIC METABOLIC PANEL
Anion gap: 5 (ref 5–15)
BUN: 17 mg/dL (ref 8–23)
CO2: 22 mmol/L (ref 22–32)
Calcium: 9.1 mg/dL (ref 8.9–10.3)
Chloride: 110 mmol/L (ref 98–111)
Creatinine, Ser: 0.95 mg/dL (ref 0.44–1.00)
GFR, Estimated: >60 mL/min (ref >60)
Glucose, Bld: 104 mg/dL — ABNORMAL HIGH (ref 70–99)
Potassium: 4.7 mmol/L (ref 3.5–5.1)
Sodium: 137 mmol/L (ref 135–145)

## 2020-12-03 LAB — PHOSPHORUS
Phosphorus: 4.8 mg/dL — ABNORMAL HIGH (ref 2.5–4.6)
Phosphorus: 5.2 mg/dL — ABNORMAL HIGH (ref 2.5–4.6)

## 2020-12-03 LAB — CBC
HCT: 35.1 % — ABNORMAL LOW (ref 36.0–46.0)
Hemoglobin: 11.5 g/dL — ABNORMAL LOW (ref 12.0–15.0)
MCH: 31.1 pg (ref 26.0–34.0)
MCHC: 32.8 g/dL (ref 30.0–36.0)
MCV: 94.9 fL (ref 80.0–100.0)
Platelets: 128 10*3/uL — ABNORMAL LOW (ref 150–400)
RBC: 3.7 MIL/uL — ABNORMAL LOW (ref 3.87–5.11)
RDW: 15.9 % — ABNORMAL HIGH (ref 11.5–15.5)
WBC: 4.9 10*3/uL (ref 4.0–10.5)
nRBC: 0 % (ref 0.0–0.2)

## 2020-12-03 LAB — MAGNESIUM
Magnesium: 2 mg/dL (ref 1.7–2.4)
Magnesium: 2.1 mg/dL (ref 1.7–2.4)

## 2020-12-03 LAB — AMMONIA: Ammonia: 52 umol/L — ABNORMAL HIGH (ref 9–35)

## 2020-12-03 MED ORDER — SODIUM CHLORIDE 0.9 % IV BOLUS
500.0000 mL | Freq: Once | INTRAVENOUS | Status: AC
  Administered 2020-12-03: 500 mL via INTRAVENOUS

## 2020-12-03 MED ORDER — VITAL HIGH PROTEIN PO LIQD
1000.0000 mL | ORAL | Status: DC
  Administered 2020-12-03: 1000 mL

## 2020-12-03 MED ORDER — LACTULOSE 10 GM/15ML PO SOLN
10.0000 g | Freq: Three times a day (TID) | ORAL | Status: DC
  Administered 2020-12-03 – 2020-12-04 (×3): 10 g via ORAL
  Filled 2020-12-03 (×3): qty 30

## 2020-12-03 MED ORDER — FENTANYL CITRATE (PF) 100 MCG/2ML IJ SOLN: 25.0000 ug | INTRAMUSCULAR | Status: DC | PRN

## 2020-12-03 MED ORDER — NALOXONE HCL 2 MG/2ML IJ SOSY
1.0000 mg | PREFILLED_SYRINGE | Freq: Once | INTRAMUSCULAR | Status: AC
  Administered 2020-12-03: 1 mg via INTRAVENOUS
  Filled 2020-12-03: qty 2

## 2020-12-03 MED ORDER — NOREPINEPHRINE 4 MG/250ML-% IV SOLN: 2.0000 ug/min | INTRAVENOUS | Status: DC

## 2020-12-03 MED ORDER — PROSOURCE TF PO LIQD
45.0000 mL | Freq: Two times a day (BID) | ORAL | Status: DC
  Filled 2020-12-03: qty 45

## 2020-12-03 MED ORDER — SODIUM CHLORIDE 0.9 % IV SOLN: 250.0000 mL | INTRAVENOUS | Status: DC

## 2020-12-03 MED ORDER — NOREPINEPHRINE 4 MG/250ML-% IV SOLN
INTRAVENOUS | Status: AC
  Administered 2020-12-03: 2 ug/min via INTRAVENOUS
  Filled 2020-12-03: qty 250

## 2020-12-03 MED ORDER — LACTULOSE 10 GM/15ML PO SOLN
10.0000 g | Freq: Three times a day (TID) | ORAL | Status: DC
  Administered 2020-12-03 (×2): 10 g
  Filled 2020-12-03 (×2): qty 30

## 2020-12-03 MED ORDER — NOREPINEPHRINE 4 MG/250ML-% IV SOLN
2.0000 ug/min | INTRAVENOUS | Status: DC
Start: 2020-12-03 — End: 2020-12-03

## 2020-12-03 NOTE — Progress Notes (Signed)
PHARMACY CONSULT NOTE - FOLLOW UP  Pharmacy Consult for Electrolyte Monitoring and Replacement   Recent Labs: Potassium (mmol/L)  Date Value  12/03/2020 4.7   Magnesium (mg/dL)  Date Value  12/03/2020 2.0   Calcium (mg/dL)  Date Value  12/03/2020 9.1   Albumin (g/dL)  Date Value  12/02/2020 3.5   Phosphorus (mg/dL)  Date Value  12/03/2020 4.8 (H)   Sodium (mmol/L)  Date Value  12/03/2020 137     Assessment: 75 year old female in the ICU intubated for airway protection in the setting of encephalopathy likely s/t unintentional overdose of several home medications. Pharmacy consult to assist in electrolyte replacement while in the ICU.  Goal of Therapy:  Electrolytes WNL  Plan:  --No electrolyte replacement indicated at this time --Continue to follow along  Tawnya Crook, PharmD, BCPS Clinical Pharmacist 12/03/2020 1:48 PM

## 2020-12-03 NOTE — Progress Notes (Signed)
I spoke with patient's son Katheryne Gorr ([phone]) who states that he is the patient's legal guardian and is not aware of any other appointed POA.  Patient's son has requested that patient's home health/caregiver/friend Joellen Jersey not being allowed to make any medical decision on his behalf.  Patient's son stated of that verbatim" not trust anything that she sees".  Rufina Falco, DNP, CCRN, FNP-C, AGACNP-BC Acute Care Nurse Practitioner  [place] [facility] Pager: [phone] [place] at [facility]

## 2020-12-03 NOTE — Progress Notes (Signed)
Pt extubated without complications. Placed on 4lpm [place], sats 95%

## 2020-12-03 NOTE — Progress Notes (Signed)
Initial Nutrition Assessment  DOCUMENTATION CODES:   Obesity unspecified  INTERVENTION:   If tube feeds initiated, recommend:  Vital HP @55ml /hr  Free water flushes 109m q4 hours to maintain tube patency   Regimen provides 1320kcal/day, 116g/day protein and 12863mday   Pt at high refeed risk; recommend monitor potassium, magnesium and phosphorus labs daily until stable  Liquid MVI daily via tube   NUTRITION DIAGNOSIS:   Inadequate oral intake related to inability to eat (pt sedated and ventilated) as evidenced by NPO status.  GOAL:   Provide needs based on ASPEN/SCCM guidelines  MONITOR:   Vent status, Labs, Weight trends, Skin, I & O's  REASON FOR ASSESSMENT:   Ventilator, Malnutrition Screening Tool    ASSESSMENT:   [age]/o female with h/o breast cancer, NASH, cirrhosis, asthma, depresison, anxiety, GERD, HTN, HLD, IBS, roux-en-y gastric bypass 2009 and fibromyalgia who is admitted with AMS requiring intubation for airway protection in the setting of toxic-metabolic encephalopathy after apparent unintentional multidrug overdose of multiple prescription medications.  Pt sedated and ventilated. OGT in place. Per MD, hold tube feeds for possible extubation. Per chart, pt appears weight stable at baseline.   Medications reviewed and include: colace, heparin, lactulose, protonix, miralax, levophed  Labs reviewed: K 4.7 wnl, P 4.8(H), Mg 2.0 wnl Cbgs- 129, 135 X 24 hrs  Patient is currently intubated on ventilator support MV: 6.0 L/min Temp (24hrs), Avg:98.8 F (37.1 C), Min:97.3 F (36.3 C), Max:100 F (37.8 C)  Propofol: none   MAP- >6521m   UOP- 1350m79mNUTRITION - FOCUSED PHYSICAL EXAM:  Flowsheet Row Most Recent Value  Orbital Region No depletion  Upper Arm Region No depletion  Thoracic and Lumbar Region No depletion  Buccal Region No depletion  Temple Region Mild depletion  Clavicle Bone Region No depletion  Clavicle and Acromion Bone Region  No depletion  Scapular Bone Region No depletion  Dorsal Hand No depletion  Patellar Region No depletion  Anterior Thigh Region No depletion  Posterior Calf Region No depletion  Edema (RD Assessment) None  Hair Reviewed  Eyes Reviewed  Mouth Reviewed  Skin Reviewed  Nails Reviewed   Diet Order:   Diet Order             Diet NPO time specified  Diet effective now                  EDUCATION NEEDS:   No education needs have been identified at this time  Skin:  Skin Assessment: Reviewed RN Assessment  Last BM:  pta  Height:   Ht Readings from Last 1 Encounters:  12/02/20 5' 3"  (1.6 m)    Weight:   Wt Readings from Last 1 Encounters:  12/03/20 102.4 kg    Ideal Body Weight:  52.3 kg  BMI:  Body mass index is 39.99 kg/m.  Estimated Nutritional Needs:   Kcal:  1126-1434kcal/day  Protein:  >105g/day  Fluid:  1.3-1.6L/day  CaseKoleen Distance RD, LDN Please refer to [facility] [place] RD and/or RD on-call/weekend/after hours pager

## 2020-12-04 ENCOUNTER — Inpatient Hospital Stay (HOSPITAL_COMMUNITY)
Admit: 2020-12-04 | Discharge: 2020-12-04 | Disposition: A | Discharge status: Home / Self Care | Payer: Medicare Other | Attending: Internal Medicine | Admitting: Internal Medicine

## 2020-12-04 DIAGNOSIS — J9601 Acute respiratory failure with hypoxia: Secondary | ICD-10-CM | POA: Diagnosis not present

## 2020-12-04 DIAGNOSIS — I4891 Unspecified atrial fibrillation: Secondary | ICD-10-CM | POA: Diagnosis not present

## 2020-12-04 DIAGNOSIS — I480 Paroxysmal atrial fibrillation: Secondary | ICD-10-CM | POA: Diagnosis not present

## 2020-12-04 DIAGNOSIS — K7460 Unspecified cirrhosis of liver: Secondary | ICD-10-CM | POA: Diagnosis not present

## 2020-12-04 DIAGNOSIS — G928 Other toxic encephalopathy: Secondary | ICD-10-CM | POA: Diagnosis not present

## 2020-12-04 LAB — COMPREHENSIVE METABOLIC PANEL
ALT: 34 U/L (ref 0–44)
AST: 77 U/L — ABNORMAL HIGH (ref 15–41)
Albumin: 2.9 g/dL — ABNORMAL LOW (ref 3.5–5.0)
Alkaline Phosphatase: 75 U/L (ref 38–126)
Anion gap: 7 (ref 5–15)
BUN: 27 mg/dL — ABNORMAL HIGH (ref 8–23)
CO2: 24 mmol/L (ref 22–32)
Calcium: 9.7 mg/dL (ref 8.9–10.3)
Chloride: 108 mmol/L (ref 98–111)
Creatinine, Ser: 0.94 mg/dL (ref 0.44–1.00)
GFR, Estimated: >60 mL/min (ref >60)
Glucose, Bld: 118 mg/dL — ABNORMAL HIGH (ref 70–99)
Potassium: 4.7 mmol/L (ref 3.5–5.1)
Sodium: 139 mmol/L (ref 135–145)
Total Bilirubin: 1.1 mg/dL (ref 0.3–1.2)
Total Protein: 6.1 g/dL — ABNORMAL LOW (ref 6.5–8.1)

## 2020-12-04 LAB — CBC
HCT: 37.7 % (ref 36.0–46.0)
Hemoglobin: 11.8 g/dL — ABNORMAL LOW (ref 12.0–15.0)
MCH: 30.4 pg (ref 26.0–34.0)
MCHC: 31.3 g/dL (ref 30.0–36.0)
MCV: 97.2 fL (ref 80.0–100.0)
Platelets: 123 10*3/uL — ABNORMAL LOW (ref 150–400)
RBC: 3.88 MIL/uL (ref 3.87–5.11)
RDW: 16.3 % — ABNORMAL HIGH (ref 11.5–15.5)
WBC: 6.4 10*3/uL (ref 4.0–10.5)
nRBC: 0 % (ref 0.0–0.2)

## 2020-12-04 LAB — IRON AND TIBC
Iron: 50 ug/dL (ref 28–170)
Saturation Ratios: 15 % (ref 10.4–31.8)
TIBC: 336 ug/dL (ref 250–450)
UIBC: 286 ug/dL

## 2020-12-04 LAB — GLUCOSE, CAPILLARY
Glucose-Capillary: 108 mg/dL — ABNORMAL HIGH (ref 70–99)
Glucose-Capillary: 98 mg/dL (ref 70–99)
Glucose-Capillary: 116 mg/dL — ABNORMAL HIGH (ref 70–99)
Glucose-Capillary: 118 mg/dL — ABNORMAL HIGH (ref 70–99)
Glucose-Capillary: 122 mg/dL — ABNORMAL HIGH (ref 70–99)
Glucose-Capillary: 128 mg/dL — ABNORMAL HIGH (ref 70–99)

## 2020-12-04 LAB — PHOSPHORUS: Phosphorus: 3.8 mg/dL (ref 2.5–4.6)

## 2020-12-04 LAB — HEPATITIS PANEL, ACUTE
HCV Ab: NONREACTIVE
Hep A IgM: NONREACTIVE
Hep B C IgM: NONREACTIVE
Hepatitis B Surface Ag: NONREACTIVE

## 2020-12-04 LAB — AMMONIA: Ammonia: 58 umol/L — ABNORMAL HIGH (ref 9–35)

## 2020-12-04 LAB — MAGNESIUM: Magnesium: 2.1 mg/dL (ref 1.7–2.4)

## 2020-12-04 LAB — T3, FREE: T3, Free: 2.6 pg/mL (ref 2.0–4.4)

## 2020-12-04 LAB — PROTIME-INR
INR: 1.1 (ref 0.8–1.2)
Prothrombin Time: 13.7 seconds (ref 11.4–15.2)

## 2020-12-04 LAB — FOLATE: Folate: 10.1 ng/mL (ref >5.9)

## 2020-12-04 LAB — VITAMIN B12: Vitamin B-12: 351 pg/mL (ref 180–914)

## 2020-12-04 LAB — FERRITIN: Ferritin: 27 ng/mL (ref 11–307)

## 2020-12-04 MED ORDER — NALOXONE HCL 0.4 MG/ML IJ SOLN
INTRAMUSCULAR | Status: AC
  Filled 2020-12-04: qty 1

## 2020-12-04 MED ORDER — PANTOPRAZOLE SODIUM 40 MG PO TBEC
40.0000 mg | DELAYED_RELEASE_TABLET | Freq: Every day | ORAL | Status: DC
  Administered 2020-12-04 – 2020-12-05 (×2): 40 mg via ORAL
  Filled 2020-12-04 (×2): qty 1

## 2020-12-04 MED ORDER — LEVOTHYROXINE SODIUM 50 MCG PO TABS
50.0000 ug | ORAL_TABLET | Freq: Every day | ORAL | Status: DC
  Administered 2020-12-05 – 2020-12-06 (×2): 50 ug via ORAL
  Filled 2020-12-04 (×2): qty 1

## 2020-12-04 MED ORDER — NALOXONE HCL 0.4 MG/ML IJ SOLN
1.0000 mg | Freq: Once | INTRAMUSCULAR | Status: AC
  Administered 2020-12-04: 1 mg via INTRAVENOUS
  Filled 2020-12-04: qty 3

## 2020-12-04 MED ORDER — LACTULOSE 10 GM/15ML PO SOLN
20.0000 g | Freq: Three times a day (TID) | ORAL | Status: DC
  Administered 2020-12-04 – 2020-12-05 (×2): 20 g via ORAL
  Filled 2020-12-04 (×2): qty 30

## 2020-12-04 MED ORDER — METOPROLOL TARTRATE 25 MG PO TABS
12.5000 mg | ORAL_TABLET | Freq: Two times a day (BID) | ORAL | Status: DC
  Administered 2020-12-04 – 2020-12-05 (×4): 12.5 mg via ORAL
  Filled 2020-12-04 (×4): qty 1

## 2020-12-04 NOTE — Progress Notes (Signed)
*  PRELIMINARY RESULTS* Echocardiogram 2D Echocardiogram has been performed. A Saline Microcavitation (Bubble Study) was requested and completed on this study.  Gwendolyn [name] 12/04/2020, 3:53 PM

## 2020-12-04 NOTE — Plan of Care (Signed)
Patient responds to voice but was very drowsy throughout the shift. Vitals remained stable. Narcan was given per MD, patient was more alert after.. Patient is confused, oriented to person only but can follow commands. Education and patient updates provided to health care power of attorneys.

## 2020-12-04 NOTE — Progress Notes (Signed)
PT Cancellation Note  Patient Details Name: Gwendolyn [name] MRN: [id number] DOB: Jan 27, 1946   Cancelled Treatment:    Reason Eval/Treat Not Completed: Other (comment) PT orders received, chart reviewed. Pt received in bed, sleeping with eyes open. Pt reflexively responds to touch/verbal communication but unable to sustain alertness nor follow simple commands with multimodal cuing. Pt not appropriate for PT evaluation at this time. Will f/u as able & as pt is appropriate for PT intervention.  Lavone Nian, PT, DPT 12/04/20, 11:53 AM    [name] 12/04/2020, 11:52 AM

## 2020-12-04 NOTE — Progress Notes (Signed)
Brief Nutrition Note  Received RD consult for enteral nutrition initiation and management on 8/19 at 1549. Pt extubated on 8/19 at 1723. No enteral access at this time. Pt is currently NPO. RD will monitor for diet advancement and order oral nutrition supplements as appropriate. RD will continue to follow pt during admission.   Gustavus Bryant, MS, RD, LDN Inpatient Clinical Dietitian Please see [facility] for contact information.

## 2020-12-04 NOTE — TOC Initial Note (Signed)
Transition of Care [facility]) - Initial/Assessment Note    Patient Details  Name: Gwendolyn [name] MRN: [id number] Date of Birth: 1945-10-12  Transition of Care [facility]) CM/SW Contact:    Gwendolyn Ivan, LCSW Phone Number: 12/04/2020, 2:47 PM  Clinical Narrative:                TOC consulted. Per chart review, patient is disoriented. CSW called and spoke to patient's son Gwendolyn [name] for assessment. Gwendolyn [name] reported patient lives at [facility]. He is unsure if she lives in the Independent or Assisted Living part of [place]. Patient has lived there for about a week. Previously, she lived at another facility in [place]. Gwendolyn [name] reported he is not aware of patient having any DME or SNF history. He stated patient has had [facility] in the past, unsure of agency. Gwendolyn [name] lives in [place] but is supportive and visits patient. Patient no longer drives as family took her car due to safety concerns. Per chart review, PCP is Dr. Janene Harvey. Gwendolyn [name] is wondering if patient needs a higher level of care. CSW attempted calls to [place] to confirm if patient is ILF or ALF currently, unable to reach anyone, TOC will need to follow up Monday. TOC will continue to follow.    Expected Discharge Plan: Assisted Living Barriers to Discharge: Continued Medical Work up   Patient Goals and CMS Choice   CMS Medicare.gov Compare Post Acute Care list provided to:: Patient Represenative (must comment) Choice offered to / list presented to : Adult Children  Expected Discharge Plan and Services Expected Discharge Plan: Assisted Living       Living arrangements for the past 2 months: [place]                                      Prior Living Arrangements/Services Living arrangements for the past 2 months: [place] Lives with:: Facility Resident Patient language and need for interpreter reviewed:: Yes        Need for Family Participation in Patient Care: Yes (Comment) Care giver support  system in place?: Yes (comment)   Criminal Activity/Legal Involvement Pertinent to Current Situation/Hospitalization: No - Comment as needed  Activities of Daily Living Home Assistive Devices/Equipment: Other (Comment) (UTA ETT) ADL Screening (condition at time of admission) Patient's cognitive ability adequate to safely complete daily activities?: Yes (UTA ETT) Is the patient deaf or have difficulty hearing?: No (UTA ETT) Does the patient have difficulty seeing, even when wearing glasses/contacts?: No (UTA ETT) Does the patient have difficulty concentrating, remembering, or making decisions?: No (UTA ETT) Patient able to express need for assistance with ADLs?: Yes (UTA ETT) Does the patient have difficulty dressing or bathing?: No (UTA ETT) Independently performs ADLs?: Yes (appropriate for developmental age) (UTA ETT) Does the patient have difficulty walking or climbing stairs?: No (UTA ETT) Weakness of Legs: None (UTA ETT) Weakness of Arms/Hands: None (UTA ETT)  Permission Sought/Granted                  Emotional Assessment       Orientation: : Fluctuating Orientation (Suspected and/or reported Sundowners) Alcohol / Substance Use: Not Applicable Psych Involvement: No (comment)  Admission diagnosis:  Acute respiratory failure with hypoxia ([place]) [J96.01] Accidental drug overdose, initial encounter [T50.901A] Endotracheally intubated [Z97.8] Patient Active Problem List   Diagnosis Date Noted   Acute respiratory failure with hypoxia ([place]) 12/02/2020  Metabolic encephalopathy 67/20/9470   Cirrhosis of liver ([place])    Cerebrovascular disease    ARF (acute renal failure) ([place]) 01/09/2018   Chronic venous insufficiency 11/28/2017   Lymphedema 11/28/2017   Leg pain 11/28/2017   GERD (gastroesophageal reflux disease) 11/28/2017   Unspecified hypothyroidism 12/10/2013   Medicare annual wellness visit, subsequent 07/08/2013   Weakness of both legs 07/08/2013   Night sweats  01/16/2013   Tremor 01/16/2013   Palpitations 01/16/2013   Fibromyalgia 07/22/2012   Essential hypertension, benign 06/19/2012   Bereavement 06/19/2012   Depression 03/28/2012   Insomnia 03/28/2012   Lumbar back pain 12/22/2011   HX: breast cancer 09/18/2011   Abnormal CT lung screening 08/07/2011   NASH (nonalcoholic steatohepatitis) 06/12/2011   PCP:  Barbaraann Boys, MD Pharmacy:   [id number] [place]Phone: [phone]: [phone] [address] [address] [address] [place] [address]Phone: [phone]: [phone]    Social Determinants of Health (SDOH) Interventions    Readmission Risk Interventions Readmission Risk Prevention Plan 12/04/2020  Post Dischage Appt Complete  Medication Screening Complete  Transportation Screening Complete  Some recent data might be hidden

## 2020-12-04 NOTE — Progress Notes (Signed)
PHARMACY CONSULT NOTE - FOLLOW UP  Pharmacy Consult for Electrolyte Monitoring and Replacement   Recent Labs: Potassium (mmol/L)  Date Value  12/04/2020 4.7   Magnesium (mg/dL)  Date Value  12/04/2020 2.1   Calcium (mg/dL)  Date Value  12/04/2020 9.7   Albumin (g/dL)  Date Value  12/04/2020 2.9 (L)   Phosphorus (mg/dL)  Date Value  12/04/2020 3.8   Sodium (mmol/L)  Date Value  12/04/2020 139     Assessment: 75 year old female in the ICU intubated for airway protection in the setting of encephalopathy likely s/t unintentional overdose of several home medications. Pharmacy consult to assist in electrolyte replacement while in the ICU.  Goal of Therapy:  Electrolytes WNL  Plan:  --No electrolyte replacement indicated at this time --Continue to follow along  Noralee Space, PharmD Clinical Pharmacist 12/04/2020 10:38 AM

## 2020-12-04 NOTE — Progress Notes (Signed)
NAME:  Gwendolyn [name], MRN:  [id number], DOB:  June 08, 1945, LOS: 2 ADMISSION DATE:  12/02/2020, CONSULTATION DATE: 12/02/2020 REFERRING MD: Vladimir Crofts, MD, CHIEF COMPLAINT: Altered mental status   HPI  75 y.o female with significant PMH as below who presented to the ED with chief complaints of altered mental status and near syncopal episode.  Patient is currently intubated and sedated, history obtained from patient's POA/friend who is currently at the bedside.  POA, patient recently moved to an assisted living at [place].  Patient apparently has not been taking her home medication which consist of Lexapro 20 mg, Requip, Cymbalta, Lyrica, and buprenorphine for unknown period of time.  Per POA who manages her medication, today was the first time the patient took her medications.  Patient's POA states that they were walking 30 minutes after she took her medication around 330 when she suddenly complained of feeling dizzy near syncope.   Patient was able to sit down but was soon noted to be somnolent, clammy and incoherent.  POA also stated that patient's eyes appeared to be rolling backwards but did not lose consciousness entirely.  EMS was called and patient was transported to the ED.  ED Course: On arrival to the ED, he was afebrile with blood pressure 130/63 mm Hg and pulse rate 106 beats/min, RR 23 with oxygen saturation sent on 6 L.  There were no obvious focal neurological deficits; She was somnolent requiring frequent sternal rubs and stimulation to keep her awake and breathing.  Patient was placed on n.p.o. with almost no fighting from the patient.  She received 3 mg of Narcan with minimal to no improvement of her symptoms.  Given concerns for possible accidental overdose, patient was intubated for airway protection and respiratory failure.  PCCM consulted for admission to ICU.  Initial Labs/Diagnostics WBC/Hgb/Hct/Plts:  6.4/11.8/37.7/123 (08/20 0618)  Glucose 141, AST 99 EKG:  Sinus tachycardia with rate of 121 bpm.  Normal axis and intervals.  QTc 480.  Nonspecific ST changes laterally and inferiorly without STEMI.  1 PVC CXR: Left medial basilar opacity which could be atelectasis or aspiration/infected.  Small left pleural effusion. UA/UDS: Negative Arterial Blood Gas result:  pO2 140; pCO2 28; pH 7.45;  HCO3 19.5, %O2 Sat 99.3  Past Medical History  Liver cirrhosis Chronic venous insufficiency Lymphedema GERD Hypertension Breast cancer Nonalcoholic steatohepatitis Anxiety and depression Asthma Hyperlipidemia Hypothyroidism IBS Migraines RLS Trigeminal neuralgia CVA Significant Hospital Events   8/18: Admitted to ICU with altered mental status secondary to suspected accidental drug overdose 8/19: extubated to nasal cannula  Interval History  Extubated to nasal cannula yesterday. Remains drowsy, but awakens easily and is conversant. No new complaints this AM. Appears to be in Afib this morning, with rate in the 90s/low 100s. She has a known history of Afib documented.  Consults:  PCCM  Procedures:  8/18: Intubation 8/19: Extubated  Significant Diagnostic Tests:  8/18: Chest Xray>Left medial basilar opacities which could be atelectasis or aspiration/infection. Small left pleural effusion. 8/18: Noncontrast CTA head and neck> no acute intracranial abnormality no LVO  Micro Data:  8/18: SARS-CoV-2 PCR> negative 8/18: Influenza PCR> negative  Antimicrobials:  None required  OBJECTIVE  Blood pressure 110/69, pulse 98, temperature 99.5 F (37.5 C), resp. rate 13, height _0  (1.6 m), weight 98.1 kg, SpO2 97 %.    Vent Mode: Spontaneous FiO2 (%):  [30 %-35 %] 30 % Set Rate:  [6 bmp-12 bmp] 12 bmp Vt Set:  [400 mL] 400  mL PEEP:  [5 cmH20] 5 cmH20 Pressure Support:  [5 cmH20] 5 cmH20   Intake/Output Summary (Last 24 hours) at 12/04/2020 0817 Last data filed at 12/04/2020 0600 Gross per 24 hour  Intake 481.7 ml  Output 645 ml  Net  -163.3 ml   Filed Weights   12/02/20 2130 12/03/20 0429 12/04/20 0434  Weight: 98.4 kg 102.4 kg 98.1 kg     Physical Examination  GENERAL: thin Caucasian female; appears older than stated age EYES: Pupils equal, round, reactive HEENT: Head atraumatic, normocephalic. Oropharynx and nasopharynx clear.  LUNGS: Normal breath sounds bilaterally, no wheezing, rales,rhonchi CARDIOVASCULAR: S1, S2 normal. No murmurs, rubs, or gallops.  ABDOMEN: Soft, nontender, nondistended. Bowel sounds present.  EXTREMITIES: No pedal edema, cyanosis, or clubbing.  SKIN: No obvious rash, lesion, or ulcer.   Labs/imaging that I havepersonally reviewed  (right click and "Reselect all SmartList Selections" daily)     Labs   CBC: Recent Labs  Lab 12/02/20 1729 12/03/20 0429 12/04/20 0618  WBC 4.8 4.9 6.4  HGB 13.1 11.5* 11.8*  HCT 40.0 35.1* 37.7  MCV 94.1 94.9 97.2  PLT 185 128* 123*    Basic Metabolic Panel: Recent Labs  Lab 12/02/20 1729 12/03/20 0429 12/03/20 1620 12/04/20 0618  NA 137 137  --  139  K 3.8 4.7  --  4.7  CL 106 110  --  108  CO2 23 22  --  24  GLUCOSE 141* 104*  --  118*  BUN 15 17  --  27*  CREATININE 0.64 0.95  --  0.94  CALCIUM 9.8 9.1  --  9.7  MG  --  2.0 2.1 2.1  PHOS  --  4.8* 5.2* 3.8   GFR: Estimated Creatinine Clearance: 60.4 mL/min (by C-G formula based on SCr of 0.94 mg/dL). Recent Labs  Lab 12/02/20 1729 12/03/20 0429 12/04/20 0618  WBC 4.8 4.9 6.4    Liver Function Tests: Recent Labs  Lab 12/02/20 1729 12/04/20 0618  AST 99* 77*  ALT 42 34  ALKPHOS 112 75  BILITOT 1.0 1.1  PROT 7.4 6.1*  ALBUMIN 3.5 2.9*   No results for input(s): LIPASE, AMYLASE in the last 168 hours. Recent Labs  Lab 12/03/20 0429 12/04/20 0618  AMMONIA 52* 58*    ABG    Component Value Date/Time   PHART 7.26 (L) 12/03/2020 0000   PCO2ART 53 (H) 12/03/2020 0000   PO2ART 97 12/03/2020 0000   HCO3 22.5 12/03/2020 0611   ACIDBASEDEF 2.3 (H) 12/03/2020 0611    O2SAT 91.3 12/03/2020 0611     Coagulation Profile: No results for input(s): INR, PROTIME in the last 168 hours.  Cardiac Enzymes: No results for input(s): CKTOTAL, CKMB, CKMBINDEX, TROPONINI in the last 168 hours.  HbA1C: No results found for: HGBA1C  CBG: Recent Labs  Lab 12/03/20 1605 12/03/20 1921 12/03/20 2321 12/04/20 0336 12/04/20 0724  GLUCAP 117* 126* 98 108* 98    Review of Systems:   Unable to assess patient is currently intubated and sedated  Past Medical History  She,  has a past medical history of Anxiety, Arthritis, Asthma, Breast cancer ([place]) (01/2010), Chronic low back pain, Cirrhosis of liver (HCC), Depression, Fibromyalgia, GERD (gastroesophageal reflux disease), History of chicken pox, HTN (hypertension), Hyperlipidemia, Hypothyroidism, IBS (irritable bowel syndrome), Migraines, NASH (nonalcoholic steatohepatitis), PONV (postoperative nausea and vomiting), RLS (restless legs syndrome), Seasonal allergies, and Trigeminal neuralgia.   Surgical History    Past Surgical History:  Procedure Laterality Date  ABDOMINAL HYSTERECTOMY     ANKLE SURGERY  2006   BREAST BIOPSY  2011   [place]   CHOLECYSTECTOMY  2005   CYSTOCELE REPAIR     DILATION AND CURETTAGE OF UTERUS     GASTRIC BYPASS  2008   KNEE ARTHROSCOPY Left 09/17/2018   Procedure: KNEE ARTHROSCOPY WITH DEBRIDEMENT, PARTIAL MEDIAL MENISCECTOMY AND SUB-CHONDROPLASTY OF A BONE MARROW LESION INVOLVING THE MEIDAL TIBIAL PLATEAU;  Surgeon: Corky Mull, MD;  Location: [facility] ORS;  Service: Orthopedics;  Laterality: Left;   MASTECTOMY  2011   Breast Cancer   RECTOCELE REPAIR     ROTATOR CUFF REPAIR  2001   SEPTOPLASTY     TONSILLECTOMY  age 36   TOTAL ABDOMINAL HYSTERECTOMY W/ BILATERAL SALPINGOOPHORECTOMY  1991   VAGINAL DELIVERY     x3     Social History   reports that she has never smoked. She has never used smokeless tobacco. She reports that she does not currently use alcohol. She reports that she  does not use drugs.   Family History   Her family history includes Alzheimer's disease in her father; Arthritis in her maternal grandfather and maternal grandmother; COPD in her mother; Cancer (age of onset: 108) in her sister.   Allergies Allergies  Allergen Reactions   Codeine Nausea And Vomiting   Oxycodone Itching   Silver Other (See Comments)    (Tegaderm) Blisters.  Okay on hands.     Home Medications  Prior to Admission medications   Medication Sig Start Date End Date Taking? Authorizing Provider  acetaminophen (TYLENOL) 500 MG tablet Take 1,000 mg by mouth every 6 (six) hours as needed for moderate pain or headache.    [provider]  albuterol (PROVENTIL HFA;VENTOLIN HFA) 108 (90 Base) MCG/ACT inhaler Inhale 2 puffs into the lungs every 6 (six) hours as needed for wheezing or shortness of breath.    [provider]  atorvastatin (LIPITOR) 40 MG tablet Take 40 mg by mouth at bedtime.     [provider]  baclofen (LIORESAL) 10 MG tablet Take 10-20 mg by mouth See admin instructions. Take 1 tablet (13m) by mouth every morning and take 2 tablets (271m by mouth every evening    [provider]  calcium carbonate (OS-CAL - DOSED IN MG OF ELEMENTAL CALCIUM) 1250 (500 Ca) MG tablet Take 1 tablet by mouth daily.    [provider]  Cholecalciferol (VITAMIN D) 50 MCG (2000 UT) tablet Take 2,000 Units by mouth daily.    [provider]  enalapril (VASOTEC) 10 MG tablet Hold this until outpatient PCP followup, because you had low blood pressure and kidney injury when you presented to the hospital. 07/19/19   LaEnzo BiMD  escitalopram (LEXAPRO) 20 MG tablet Take 20 mg by mouth daily.  07/19/17   [provider]  fluticasone (FLONASE) 50 MCG/ACT nasal spray Place 1 spray into both nostrils 2 (two) times daily as needed for allergies.     [provider]  fluticasone (FLOVENT HFA) 110 MCG/ACT inhaler Inhale 1 puff into the  lungs 2 (two) times daily as needed (shortness of breath).     [provider]  gabapentin (NEURONTIN) 300 MG capsule Take 600 mg by mouth 3 (three) times daily.     [provider]  ibandronate (BONIVA) 150 MG tablet Take 150 mg by mouth every 30 (thirty) days.     [provider]  levothyroxine (SYNTHROID, LEVOTHROID) 50 MCG tablet Take 1 tablet (  50 mcg total) by mouth daily. 07/08/13   Jackolyn Confer, MD  metoprolol succinate (TOPROL-XL) 25 MG 24 hr tablet Take 1 tablet (25 mg total) by mouth daily. 07/08/13   Jackolyn Confer, MD  Multiple Vitamins-Minerals (MULTIVITAMIN WITH MINERALS) tablet Take 1 tablet by mouth daily.    [provider]  omeprazole (PRILOSEC) 20 MG capsule Take 20 mg by mouth daily.     [provider]  oxybutynin (DITROPAN-XL) 10 MG 24 hr tablet Take 10 mg by mouth at bedtime.     [provider]  oxyCODONE (OXY IR/ROXICODONE) 5 MG immediate release tablet Take 1 tablet (5 mg total) by mouth every 6 (six) hours as needed for moderate pain or severe pain. 07/19/19   Enzo Bi, MD  rOPINIRole (REQUIP) 2 MG tablet Take 2 mg by mouth 3 (three) times daily.    [provider]  vitamin E 1000 UNIT capsule Take 1 tablet by mouth daily.     [provider]    Scheduled Meds:  chlorhexidine gluconate (MEDLINE KIT)  15 mL Mouth Rinse BID   Chlorhexidine Gluconate Cloth  6 each Topical Q0600   heparin injection (subcutaneous)  5,000 Units Subcutaneous Q8H   lactulose  10 g Oral TID   mouth rinse  15 mL Mouth Rinse 10 times per day   pantoprazole (PROTONIX) IV  40 mg Intravenous QHS   Continuous Infusions:  sodium chloride Stopped (12/03/20 0427)   PRN Meds:.docusate sodium, polyethylene glycol   Active Hospital Problem list   Intubated for airway protection Accidental drug overdose Acute toxic metabolic encephalopathy Elevated LFTs Hypertension Hypothyroidism Anxiety and  depression RLS  Assessment & Plan:  Intubated for airway protection -Extubated 8/19 -Wean FiO2 for goal SpO2 >=88%  Toxic metabolic encephalopathy in the setting of unintentional multidrug overdose Cirrhosis Hx: CVA, and mild cognitive impairment -Avoid centrally acting medications for now -Start lactulose TID to target 3-5 BM/day -General delirium precautions -Check iron profile, hepatitis panel  Paroxysmal Afib -Obtain TTE -Start Lopressor -Consider initiation of systemic A/C as this is a previously documented condition  Anxiety and depression -Hold home meds, Cymbalta, Lyrica, Cipro and buprenorphine  Restless leg syndrome -Hold Requip  Hypothyroidism -Continue Synthroid  This patient has no additional ICU needs. Will transfer to [facility], Med-Surg with Telemetry.  Best practice:  Diet:  Oral Pain/Anxiety/Delirium protocol (if indicated): No VAP protocol (if indicated): Not indicated DVT prophylaxis: Subcutaneous Heparin GI prophylaxis: PPI Glucose control:  SSI Yes Central venous access:  N/A Arterial line:  N/A Foley:  N/A Mobility:  OOB  PT consulted: Yes Last date of multidisciplinary goals of care discussion [8/19] Code Status:  full code Disposition: ICU, transfer to floor  = Goals of Care = Code Status Order: FULL  Primary Emergency Contact: Mcmasters,[name] Wishes to pursue full aggressive treatment and intervention options, including CPR and intubation,  goals of care will be addressed on going with family if that should become necessary.  Critical care time: 32 minutes     Otelia Limes, MD Acute Care Nurse Practitioner [place]

## 2020-12-05 DIAGNOSIS — J9601 Acute respiratory failure with hypoxia: Secondary | ICD-10-CM | POA: Diagnosis not present

## 2020-12-05 LAB — COMPREHENSIVE METABOLIC PANEL
ALT: 37 U/L (ref 0–44)
AST: 93 U/L — ABNORMAL HIGH (ref 15–41)
Albumin: 2.9 g/dL — ABNORMAL LOW (ref 3.5–5.0)
Alkaline Phosphatase: 83 U/L (ref 38–126)
Anion gap: 5 (ref 5–15)
BUN: 34 mg/dL — ABNORMAL HIGH (ref 8–23)
CO2: 26 mmol/L (ref 22–32)
Calcium: 10.3 mg/dL (ref 8.9–10.3)
Chloride: 111 mmol/L (ref 98–111)
Creatinine, Ser: 0.91 mg/dL (ref 0.44–1.00)
GFR, Estimated: >60 mL/min (ref >60)
Glucose, Bld: 128 mg/dL — ABNORMAL HIGH (ref 70–99)
Potassium: 5.2 mmol/L — ABNORMAL HIGH (ref 3.5–5.1)
Sodium: 142 mmol/L (ref 135–145)
Total Bilirubin: 1.5 mg/dL — ABNORMAL HIGH (ref 0.3–1.2)
Total Protein: 6.7 g/dL (ref 6.5–8.1)

## 2020-12-05 LAB — CBC
HCT: 39.5 % (ref 36.0–46.0)
Hemoglobin: 12.2 g/dL (ref 12.0–15.0)
MCH: 30 pg (ref 26.0–34.0)
MCHC: 30.9 g/dL (ref 30.0–36.0)
MCV: 97.1 fL (ref 80.0–100.0)
Platelets: 154 10*3/uL (ref 150–400)
RBC: 4.07 MIL/uL (ref 3.87–5.11)
RDW: 16 % — ABNORMAL HIGH (ref 11.5–15.5)
WBC: 5.2 10*3/uL (ref 4.0–10.5)
nRBC: 0 % (ref 0.0–0.2)

## 2020-12-05 LAB — ECHOCARDIOGRAM COMPLETE BUBBLE STUDY
AR max vel: 1.89 cm2
AV Peak grad: 10.6 mmHg
Ao pk vel: 1.63 m/s
Area-P 1/2: 5.84 cm2
S' Lateral: 2.97 cm

## 2020-12-05 LAB — GLUCOSE, CAPILLARY
Glucose-Capillary: 120 mg/dL — ABNORMAL HIGH (ref 70–99)
Glucose-Capillary: 103 mg/dL — ABNORMAL HIGH (ref 70–99)
Glucose-Capillary: 114 mg/dL — ABNORMAL HIGH (ref 70–99)
Glucose-Capillary: 121 mg/dL — ABNORMAL HIGH (ref 70–99)
Glucose-Capillary: 112 mg/dL — ABNORMAL HIGH (ref 70–99)
Glucose-Capillary: 117 mg/dL — ABNORMAL HIGH (ref 70–99)

## 2020-12-05 LAB — POTASSIUM: Potassium: 5 mmol/L (ref 3.5–5.1)

## 2020-12-05 LAB — PHOSPHORUS: Phosphorus: 3.5 mg/dL (ref 2.5–4.6)

## 2020-12-05 LAB — MAGNESIUM: Magnesium: 2.4 mg/dL (ref 1.7–2.4)

## 2020-12-05 MED ORDER — SODIUM POLYSTYRENE SULFONATE 15 GM/60ML PO SUSP
30.0000 g | Freq: Once | ORAL | Status: AC
  Administered 2020-12-05: 30 g via ORAL
  Filled 2020-12-05: qty 120

## 2020-12-05 MED ORDER — LACTULOSE 10 GM/15ML PO SOLN
20.0000 g | Freq: Three times a day (TID) | ORAL | Status: DC
  Administered 2020-12-05 (×2): 20 g via ORAL
  Filled 2020-12-05 (×3): qty 30

## 2020-12-05 MED ORDER — ENOXAPARIN SODIUM 60 MG/0.6ML IJ SOSY
0.5000 mg/kg | PREFILLED_SYRINGE | INTRAMUSCULAR | Status: DC
  Administered 2020-12-05 – 2020-12-06 (×2): 47.5 mg via SUBCUTANEOUS
  Filled 2020-12-05 (×3): qty 0.47

## 2020-12-05 NOTE — NC FL2 (Signed)
[place] LEVEL OF CARE SCREENING TOOL     IDENTIFICATION  Patient Name: Gwendolyn [name] Birthdate: January 11, 1946 Sex: female Admission Date (Current Location): 12/02/2020  [facility] and [place] Number:  Engineering geologist and Address:  [facility], [address] [place], [place] [address]      Provider Number: [id number]  Attending Physician Name and Address:  Enzo Bi, MD  Relative Name and Phone Number:  Carrieanne, Kleen)   [phone] [facility])    Current Level of Care: Hospital Recommended Level of Care: [place] Prior Approval Number:    Date Approved/Denied:   PASRR Number: PENDING  Discharge Plan:      Current Diagnoses: Patient Active Problem List   Diagnosis Date Noted   Acute respiratory failure with hypoxia ([place]) 61/95/0932   Metabolic encephalopathy 67/03/4579   Cirrhosis of liver ([place])    Cerebrovascular disease    ARF (acute renal failure) ([place]) 01/09/2018   Chronic venous insufficiency 11/28/2017   Lymphedema 11/28/2017   Leg pain 11/28/2017   GERD (gastroesophageal reflux disease) 11/28/2017   Unspecified hypothyroidism 12/10/2013   Medicare annual wellness visit, subsequent 07/08/2013   Weakness of both legs 07/08/2013   Night sweats 01/16/2013   Tremor 01/16/2013   Palpitations 01/16/2013   Fibromyalgia 07/22/2012   Essential hypertension, benign 06/19/2012   Bereavement 06/19/2012   Depression 03/28/2012   Insomnia 03/28/2012   Lumbar back pain 12/22/2011   HX: breast cancer 09/18/2011   Abnormal CT lung screening 08/07/2011   NASH (nonalcoholic steatohepatitis) 06/12/2011    Orientation RESPIRATION BLADDER Height & Weight     Self  Normal External catheter Weight: 214 lb 4.6 oz (97.2 kg) Height:  5' 3"  (160 cm)  BEHAVIORAL SYMPTOMS/MOOD NEUROLOGICAL BOWEL NUTRITION STATUS      Continent Diet (dys 3)  AMBULATORY STATUS COMMUNICATION OF NEEDS Skin   Extensive Assist Verbally  Normal                       Personal Care Assistance Level of Assistance  Bathing, Feeding, Dressing Bathing Assistance: Maximum assistance Feeding assistance: Maximum assistance Dressing Assistance: Maximum assistance     Functional Limitations Info             SPECIAL CARE FACTORS FREQUENCY  PT (By licensed PT), OT (By licensed OT)     PT Frequency: 5 x/week OT Frequency: 5 x/week            Contractures      Additional Factors Info  Code Status, Allergies Code Status Info: full code Allergies Info: Codeine, Oxycodone, Silver           Current Medications (12/05/2020):  This is the current hospital active medication list Current Facility-Administered Medications  Medication Dose Route Frequency Provider Last Rate Last Admin   0.9 %  sodium chloride infusion  250 mL Intravenous Continuous Lang Snow, NP   Held at 12/03/20 0427   chlorhexidine gluconate (MEDLINE KIT) (PERIDEX) 0.12 % solution 15 mL  15 mL Mouth Rinse BID Flora Lipps, MD   15 mL at 12/05/20 0800   Chlorhexidine Gluconate Cloth 2 % PADS 6 each  6 each Topical D9833 Flora Lipps, MD   6 each at 12/05/20 0400   docusate sodium (COLACE) capsule 100 mg  100 mg Oral BID PRN Lang Snow, NP       heparin injection 5,000 Units  5,000 Units Subcutaneous Q8H Benita Gutter, [facility]  5,000 Units at 12/05/20 0509   levothyroxine (SYNTHROID) tablet 50 mcg  50 mcg Oral Q0600 Bennie Pierini, MD   50 mcg at 12/05/20 3391   MEDLINE mouth rinse  15 mL Mouth Rinse 10 times per day Flora Lipps, MD   15 mL at 12/05/20 0509   metoprolol tartrate (LOPRESSOR) tablet 12.5 mg  12.5 mg Oral BID Bennie Pierini, MD   12.5 mg at 12/05/20 0936   pantoprazole (PROTONIX) EC tablet 40 mg  40 mg Oral Daily Bennie Pierini, MD   40 mg at 12/05/20 0935   polyethylene glycol (MIRALAX / GLYCOLAX) packet 17 g  17 g Oral Daily PRN Lang Snow, NP       sodium polystyrene (KAYEXALATE) 15  GM/60ML suspension 30 g  30 g Oral Once Enzo Bi, MD         Discharge Medications: Please see discharge summary for a list of discharge medications.  Relevant Imaging Results:  Relevant Lab Results:   Additional Information SS #: [id number] [place], LCSW

## 2020-12-05 NOTE — TOC Progression Note (Addendum)
Transition of Care [facility]) - Progression Note    Patient Details  Name: CASSIDEY BARRALES MRN: [id number] Date of Birth: 04-24-1945  Transition of Care [facility]) CM/SW [place], LCSW Phone Number: 12/05/2020, 12:50 PM  Clinical Narrative:   PT recommending SNF. CSW called [facility] Nay, talked to Nurses at [facility] and SNF long term care units. Both reported patient is not a resident there, is a resident of their ILF. CSW left VM for Joelene Millin in Admissions at [facility] requesting call to weekday ICU [facility] member tomorrow on if they can take patient in their SNF side tomorrow.   There is question about if friend is POA or son. POA will need to bring in paperwork to determine who is POA.  Sent FL2. PASRR pending.    2:50- Left VM for Joelene Millin at [facility] to inform her that now patient likely wont be medically ready for DC to SNF tomorrow. Asked her to return call to weekday [facility] tomorrow for follow up.  Uploaded PASRR additional information.  Expected Discharge Plan: Assisted Living Barriers to Discharge: Continued Medical Work up  Expected Discharge Plan and Services Expected Discharge Plan: Assisted Living       Living arrangements for the past 2 months: Assisted Living Facility                                       Social Determinants of Health (SDOH) Interventions    Readmission Risk Interventions Readmission Risk Prevention Plan 12/04/2020  Post Dischage Appt Complete  Medication Screening Complete  Transportation Screening Complete  Some recent data might be hidden

## 2020-12-05 NOTE — Progress Notes (Signed)
Patient weaned to room air, O2 saturation maintaining greater than 90% on room air.

## 2020-12-05 NOTE — Progress Notes (Signed)
PROGRESS NOTE    Gwendolyn [name]  BMS:[id number] DOB: [birth year] DOA: 12/02/2020 PCP: Barbaraann Boys, MD  [id number]/[id number]   Assessment & Plan:   Active Problems:   Acute respiratory failure with hypoxia ([place])   75 y.o female with significant PMH as below who presented to the ED with chief complaints of altered mental status and near syncopal episode.   Patient is currently intubated and sedated, history obtained from patient's POA/friend who is currently at the bedside.  POA, patient recently moved to an assisted living at [facility].  Patient apparently has not been taking her home medication which consist of Lexapro 20 mg, Requip, Cymbalta, Lyrica, and buprenorphine for unknown period of time.  Per POA who manages her medication, today was the first time the patient took her medications.  Patient's POA states that they were walking 30 minutes after she took her medication around 330 when she suddenly complained of feeling dizzy near syncope.   Patient was able to sit down but was soon noted to be somnolent, clammy and incoherent.  POA also stated that patient's eyes appeared to be rolling backwards but did not lose consciousness entirely.  EMS was called and patient was transported to the ED.    Given concerns for possible accidental overdose, patient was intubated for airway protection and respiratory failure.  PCCM consulted for admission to ICU.  Pt was extubated on 8/19 and transferred to hospitalist service on 8/21.   Intubated for airway protection -Extubated 8/19 --currently on RA   Toxic metabolic encephalopathy in the setting of unintentional multidrug overdose --hold sedating meds  Elevated ammonia --ammonia 50's.  Liver appeared cirrhotic.  Reported AMS seemed too sudden to be from hepatic encephalopathy Plan: --will still give lactulose for BM's   Hx of CVA, and mild cognitive impairment  Paroxysmal Afib --cont Lopressor 12.5 mg BID (new)   Anxiety and  depression Chronic pain -on home Cymbalta, Lyrica, Lexapro and buprenorphine --hold all for now   Restless leg syndrome -Hold Requip   Hypothyroidism -free T4 wnl --cont home Synthroid   DVT prophylaxis: Lovenox SQ Code Status: Full code Friend healthcare POA asked to bring in paper work for [employer] Family Communication:  Level of care: Stepdown Dispo:   The patient is from: home Anticipated d/c is to: SNF Anticipated d/c date is: 1-2 days Patient currently is not medically ready to d/c due to: somnolent   Subjective and Interval History:  Pt remained very somnolent, briefly woke up to stimuli, but fell back to sleep quickly.  Not responding to questions or commands.   Objective: Vitals:   12/05/20 1200 12/05/20 1300 12/05/20 1400 12/05/20 1500  BP: 134/64 (!) 142/80 (!) 118/57 140/75  Pulse: 63 (!) 54 65 (!) 58  Resp: 12 20 15 18   Temp: 98.4 F (36.9 C) 98.2 F (36.8 C) 98.2 F (36.8 C) 98.2 F (36.8 C)  TempSrc:      SpO2: 94% 96% 98% 97%  Weight:      Height:        Intake/Output Summary (Last 24 hours) at 12/05/2020 1700 Last data filed at 12/05/2020 1500 Gross per 24 hour  Intake --  Output 685 ml  Net -685 ml   Filed Weights   12/03/20 0429 12/04/20 0434 12/05/20 0453  Weight: 102.4 kg 98.1 kg 97.2 kg    Examination:   Constitutional: NAD, somnolent, not responding to questions or commands HEENT: conjunctivae and lids normal, pupils equal CV: No cyanosis. Systolic murmur. RESP:  normal respiratory effort, on RA Extremities: some swelling in both hands SKIN: warm, dry   Data Reviewed: I have personally reviewed following labs and imaging studies  CBC: Recent Labs  Lab 12/02/20 1729 12/03/20 0429 12/04/20 0618 12/05/20 0332  WBC 4.8 4.9 6.4 5.2  HGB 13.1 11.5* 11.8* 12.2  HCT 40.0 35.1* 37.7 39.5  MCV 94.1 94.9 97.2 97.1  PLT 185 128* 123* 458   Basic Metabolic Panel: Recent Labs  Lab 12/02/20 1729 12/03/20 0429 12/03/20 1620  12/04/20 0618 12/05/20 0332 12/05/20 1351  NA 137 137  --  139 142  --   K 3.8 4.7  --  4.7 5.2* 5.0  CL 106 110  --  108 111  --   CO2 23 22  --  24 26  --   GLUCOSE 141* 104*  --  118* 128*  --   BUN 15 17  --  27* 34*  --   CREATININE 0.64 0.95  --  0.94 0.91  --   CALCIUM 9.8 9.1  --  9.7 10.3  --   MG  --  2.0 2.1 2.1 2.4  --   PHOS  --  4.8* 5.2* 3.8 3.5  --    GFR: Estimated Creatinine Clearance: 62 mL/min (by C-G formula based on SCr of 0.91 mg/dL). Liver Function Tests: Recent Labs  Lab 12/02/20 1729 12/04/20 0618 12/05/20 0332  AST 99* 77* 93*  ALT 42 34 37  ALKPHOS 112 75 83  BILITOT 1.0 1.1 1.5*  PROT 7.4 6.1* 6.7  ALBUMIN 3.5 2.9* 2.9*   No results for input(s): LIPASE, AMYLASE in the last 168 hours. Recent Labs  Lab 12/03/20 0429 12/04/20 0618  AMMONIA 52* 58*   Coagulation Profile: Recent Labs  Lab 12/04/20 0857  INR 1.1   Cardiac Enzymes: No results for input(s): CKTOTAL, CKMB, CKMBINDEX, TROPONINI in the last 168 hours. BNP (last 3 results) No results for input(s): PROBNP in the last 8760 hours. HbA1C: No results for input(s): HGBA1C in the last 72 hours. CBG: Recent Labs  Lab 12/04/20 2322 12/05/20 0326 12/05/20 0726 12/05/20 1118 12/05/20 1613  GLUCAP 128* 120* 103* 114* 121*   Lipid Profile: No results for input(s): CHOL, HDL, LDLCALC, TRIG, CHOLHDL, LDLDIRECT in the last 72 hours. Thyroid Function Tests: Recent Labs    12/02/20 1729 12/03/20 0429  TSH 10.165*  --   FREET4 1.09  --   T3FREE  --  2.6   Anemia Panel: Recent Labs    12/04/20 0857  VITAMINB12 351  FOLATE 10.1  FERRITIN 27  TIBC 336  IRON 50   Sepsis Labs: No results for input(s): PROCALCITON, LATICACIDVEN in the last 168 hours.  Recent Results (from the past 240 hour(s))  Resp Panel by RT-PCR (Flu A&B, Covid) Nasopharyngeal Swab     Status: None   Collection Time: 12/02/20  5:55 PM   Specimen: Nasopharyngeal Swab; Nasopharyngeal(NP) swabs in vial  transport medium  Result Value Ref Range Status   SARS Coronavirus 2 by RT PCR NEGATIVE NEGATIVE Final    Comment: (NOTE) SARS-CoV-2 target nucleic acids are NOT DETECTED.  The SARS-CoV-2 RNA is generally detectable in upper respiratory specimens during the acute phase of infection. The lowest concentration of SARS-CoV-2 viral copies this assay can detect is 138 copies/mL. A negative result does not preclude SARS-Cov-2 infection and should not be used as the sole basis for treatment or other patient management decisions. A negative result may occur with  improper specimen  collection/handling, submission of specimen other than nasopharyngeal swab, presence of viral mutation(s) within the areas targeted by this assay, and inadequate number of viral copies(<138 copies/mL). A negative result must be combined with clinical observations, patient history, and epidemiological information. The expected result is Negative.  Fact Sheet for Patients:  EntrepreneurPulse.com.au  Fact Sheet for Healthcare Providers:  IncredibleEmployment.be  This test is no t yet approved or cleared by the [place] FDA and  has been authorized for detection and/or diagnosis of SARS-CoV-2 by FDA under an Emergency Use Authorization (EUA). This EUA will remain  in effect (meaning this test can be used) for the duration of the COVID-19 declaration under Section 564(b)(1) of the Act, 21 U.S.C.section 360bbb-3(b)(1), unless the authorization is terminated  or revoked sooner.       Influenza A by PCR NEGATIVE NEGATIVE Final   Influenza B by PCR NEGATIVE NEGATIVE Final    Comment: (NOTE) The Xpert Xpress SARS-CoV-2/FLU/RSV plus assay is intended as an aid in the diagnosis of influenza from Nasopharyngeal swab specimens and should not be used as a sole basis for treatment. Nasal washings and aspirates are unacceptable for Xpert Xpress SARS-CoV-2/FLU/RSV testing.  Fact  Sheet for Patients: EntrepreneurPulse.com.au  Fact Sheet for Healthcare Providers: IncredibleEmployment.be  This test is not yet approved or cleared by the [place] FDA and has been authorized for detection and/or diagnosis of SARS-CoV-2 by FDA under an Emergency Use Authorization (EUA). This EUA will remain in effect (meaning this test can be used) for the duration of the COVID-19 declaration under Section 564(b)(1) of the Act, 21 U.S.C. section 360bbb-3(b)(1), unless the authorization is terminated or revoked.  Performed at [facility], [address] [place], [place] [address]   MRSA Next Gen by PCR, Nasal     Status: None   Collection Time: 12/02/20  9:39 PM   Specimen: Nasal Mucosa; Nasal Swab  Result Value Ref Range Status   MRSA by PCR Next Gen NOT DETECTED NOT DETECTED Final    Comment: (NOTE) The GeneXpert MRSA Assay (FDA approved for NASAL specimens only), is one component of a comprehensive MRSA colonization surveillance program. It is not intended to diagnose MRSA infection nor to guide or monitor treatment for MRSA infections. Test performance is not FDA approved in patients less than 54 years old. Performed at [facility], [address], [place], [place] [address]       Radiology Studies: ECHOCARDIOGRAM COMPLETE BUBBLE STUDY  Result Date: 12/05/2020    ECHOCARDIOGRAM REPORT   Patient Name:   Gwendolyn [name] [facility] Date of Exam: 12/04/2020 Medical Rec #:  [id number]      Height:       63.0 in Accession #:    [id number]     Weight:       216.3 lb Date of Birth:  12/29/1945     BSA:          1.999 m Patient Age:    5 years       BP:           104/71 mmHg Patient Gender: F              HR:           82 bpm. Exam Location:  [place] Procedure: 2D Echo and Saline Contrast Bubble Study Indications:     Atrial fibrillation 427.31  History:         Patient has no prior history of Echocardiogram examinations.   Sonographer:  Kathlen Brunswick [email or web address] Referring Phys:  [id number] ADAM ROSS SCHERTZ Diagnosing Phys: Ida Rogue MD IMPRESSIONS  1. Left ventricular ejection fraction, by estimation, is 60 to 65%. The left ventricle has normal function. The left ventricle has no regional wall motion abnormalities. Left ventricular diastolic parameters are consistent with Grade I diastolic dysfunction (impaired relaxation).  2. Right ventricular systolic function is normal. The right ventricular size is normal.  3. Left atrial size was mildly dilated.  4. Agitated saline contrast bubble study was negative, with no evidence of any interatrial shunt.  5. Rhythm is normal sinus FINDINGS  Left Ventricle: Left ventricular ejection fraction, by estimation, is 60 to 65%. The left ventricle has normal function. The left ventricle has no regional wall motion abnormalities. The left ventricular internal cavity size was normal in size. There is  no left ventricular hypertrophy. Left ventricular diastolic parameters are consistent with Grade I diastolic dysfunction (impaired relaxation). Right Ventricle: The right ventricular size is normal. No increase in right ventricular wall thickness. Right ventricular systolic function is normal. Left Atrium: Left atrial size was mildly dilated. Right Atrium: Right atrial size was normal in size. Pericardium: There is no evidence of pericardial effusion. Mitral Valve: The mitral valve is normal in structure. No evidence of mitral valve regurgitation. No evidence of mitral valve stenosis. Tricuspid Valve: The tricuspid valve is normal in structure. Tricuspid valve regurgitation is mild . No evidence of tricuspid stenosis. Aortic Valve: The aortic valve was not well visualized. Aortic valve regurgitation is not visualized. No aortic stenosis is present. Aortic valve peak gradient measures 10.6 mmHg. Pulmonic Valve: The pulmonic valve was normal in structure. Pulmonic valve regurgitation is not  visualized. No evidence of pulmonic stenosis. Aorta: The aortic root is normal in size and structure. Venous: The inferior vena cava is normal in size with greater than 50% respiratory variability, suggesting right atrial pressure of 3 mmHg. IAS/Shunts: No atrial level shunt detected by color flow Doppler. Agitated saline contrast was given intravenously to evaluate for intracardiac shunting. Agitated saline contrast bubble study was negative, with no evidence of any interatrial shunt.  LEFT VENTRICLE PLAX 2D LVIDd:         4.26 cm  Diastology LVIDs:         2.97 cm  LV e' medial:    6.20 cm/s LV PW:         1.16 cm  LV E/e' medial:  11.2 LV IVS:        1.05 cm  LV e' lateral:   10.40 cm/s LVOT diam:     2.00 cm  LV E/e' lateral: 6.7 LV SV:         56 LV SV Index:   28 LVOT Area:     3.14 cm  RIGHT VENTRICLE RV Basal diam:  3.16 cm RV S prime:     12.60 cm/s TAPSE (M-mode): 1.9 cm LEFT ATRIUM           Index       RIGHT ATRIUM           Index LA diam:      4.30 cm 2.15 cm/m  RA Area:     14.10 cm LA Vol (A2C): 35.4 ml 17.71 ml/m RA Volume:   35.50 ml  17.76 ml/m LA Vol (A4C): 56.0 ml 28.01 ml/m  AORTIC VALVE                PULMONIC VALVE AV Area (Vmax): 1.89 cm    PV  Vmax:       1.03 m/s AV Vmax:        163.00 cm/s PV Peak grad:  4.2 mmHg AV Peak Grad:   10.6 mmHg LVOT Vmax:      98.30 cm/s LVOT Vmean:     62.100 cm/s LVOT VTI:       0.178 m  AORTA Ao Root diam: 2.80 cm Ao Asc diam:  2.70 cm MITRAL VALVE MV Area (PHT): 5.84 cm    SHUNTS MV Decel Time: 130 msec    Systemic VTI:  0.18 m MV E velocity: 69.40 cm/s  Systemic Diam: 2.00 cm MV A velocity: 75.40 cm/s MV E/A ratio:  0.92 Ida Rogue MD Electronically signed by Ida Rogue MD Signature Date/Time: 12/05/2020/9:46:26 AM    Final      Scheduled Meds:  chlorhexidine gluconate (MEDLINE KIT)  15 mL Mouth Rinse BID   Chlorhexidine Gluconate Cloth  6 each Topical Q0600   heparin injection (subcutaneous)  5,000 Units Subcutaneous Q8H    levothyroxine  50 mcg Oral Q0600   mouth rinse  15 mL Mouth Rinse 10 times per day   metoprolol tartrate  12.5 mg Oral BID   pantoprazole  40 mg Oral Daily   Continuous Infusions:  sodium chloride Stopped (12/03/20 0427)     LOS: 3 days     Enzo Bi, MD [facility] If 7PM-7AM, please contact night-coverage 12/05/2020, 5:00 PM

## 2020-12-05 NOTE — Progress Notes (Signed)
PHARMACY CONSULT NOTE - FOLLOW UP  Pharmacy Consult for Electrolyte Monitoring and Replacement   Recent Labs: Potassium (mmol/L)  Date Value  12/05/2020 5.2 (H)   Magnesium (mg/dL)  Date Value  12/05/2020 2.4   Calcium (mg/dL)  Date Value  12/05/2020 10.3   Albumin (g/dL)  Date Value  12/05/2020 2.9 (L)   Phosphorus (mg/dL)  Date Value  12/05/2020 3.5   Sodium (mmol/L)  Date Value  12/05/2020 142     Assessment: 75 year old female in the ICU intubated for airway protection in the setting of encephalopathy likely s/t unintentional overdose of several home medications. Pharmacy consult to assist in electrolyte replacement while in the ICU.  Goal of Therapy:  Electrolytes WNL  Plan:  K 4.7>>5.2 noted - has not received any KCL this admission per MAR (on potassium citrate 10 meq PTA per Med Rec) --No electrolyte replacement indicated at this time --Continue to follow along  Noralee Space, PharmD Clinical Pharmacist 12/05/2020 10:57 AM

## 2020-12-05 NOTE — Evaluation (Addendum)
Physical Therapy Evaluation Patient Details Name: Gwendolyn [name] MRN: [id number] DOB: 1945-05-03 Today's Date: 12/05/2020   History of Present Illness  Pt is a 75 y/o F admitted on 12/02/20 with c/c of AMS & near syncopal episode. Pt admitted for tx of possible accidental drug overdose. Chest x-ray revealed small L pleural effusion. PMH: anxiety, arthritis, asthma, breast CA (01/2010), chronic LBP, liver cirrhosis, depression, fibromyalgia, GERD, HTN, HLD, IBS, migraines, NASH, RLS, trigeminal neuralgia  Clinical Impression  MD cleared pt for participation in tx in setting of slightly elevated K+ (5.2). Pt seen for PT evaluation with visitor present (reporting her name is Gwendolyn [name] & she's the pt's POA). Gwendolyn [name] reports pt was independent prior to admission, did not require use of AD for mobility. On this date, pt does not verbalize any words and does not respond to name or simple commands although her eyes are open. Pt requires dependent +2 assist for supine<>sit and when assisted with sitting EOB does not flex knees for increased support/balance so returned pt to supine. PT attempts to perform PROM to BLE joints but pt maintains extension and grimaces when PT attempt to perform ROM. Encouraged Gwendolyn [name] to perform PROM as able. Will continue to follow pt acutely to progress mobility as able.   Addendum: LCSW reports pt is from [facility], not ALF.    Follow Up Recommendations SNF;Supervision/Assistance - 24 hour    Equipment Recommendations  None recommended by PT    Recommendations for Other Services       Precautions / Restrictions Precautions Precautions: Fall Restrictions Weight Bearing Restrictions: No      Mobility  Bed Mobility Overal bed mobility: Needs Assistance Bed Mobility: Supine to Sit;Sit to Supine     Supine to sit: Total assist;+2 for physical assistance;HOB elevated Sit to supine: Total assist;+2 for physical assistance;HOB elevated        Transfers                     Ambulation/Gait                Stairs            Wheelchair Mobility    Modified Rankin (Stroke Patients Only)       Balance Overall balance assessment: Needs assistance   Sitting balance-Leahy Scale: Zero   Postural control: Posterior lean                                   Pertinent Vitals/Pain Pain Assessment: Faces Faces Pain Scale: Hurts even more Pain Location: joints, generalized when PT attempts to perform PROM to all joints Pain Descriptors / Indicators: Grimacing;Guarding Pain Intervention(s): Monitored during session;Repositioned    Home Living Family/patient expects to be discharged to:: Assisted living Living Arrangements: Alone               Additional Comments: Per chart pt was a resident at [facility].    Prior Function           Comments: Gwendolyn [name] reports pt was independent without AD prior to admission.     Hand Dominance        Extremity/Trunk Assessment   Upper Extremity Assessment Upper Extremity Assessment: Generalized weakness;Difficult to assess due to impaired cognition    Lower Extremity Assessment Lower Extremity Assessment: Difficult to assess due to impaired cognition;Generalized weakness       Communication  Communication: Expressive difficulties;Receptive difficulties  Cognition Arousal/Alertness: Lethargic Behavior During Therapy: Flat affect Overall Cognitive Status: Impaired/Different from baseline Area of Impairment: Orientation;Memory;Safety/judgement;Attention;Following commands;Awareness;Problem solving                 Orientation Level: Disoriented to;Person;Place;Time;Situation   Memory: Decreased recall of precautions;Decreased short-term memory Following Commands:  (Does not follow simple, one step commands even with increased time) Safety/Judgement: Decreased awareness of deficits;Decreased awareness of safety Awareness:  Anticipatory;Emergent;Intellectual Problem Solving: Decreased initiation;Difficulty sequencing;Requires verbal cues;Requires tactile cues;Slow processing        General Comments      Exercises     Assessment/Plan    PT Assessment Patient needs continued PT services  PT Problem List Decreased strength;Decreased mobility;Decreased safety awareness;Decreased range of motion;Decreased coordination;Decreased activity tolerance;Decreased cognition;Decreased balance;Decreased knowledge of use of DME       PT Treatment Interventions DME instruction;Therapeutic activities;Modalities;Cognitive remediation;Gait training;Therapeutic exercise;Patient/family education;Balance training;Wheelchair mobility training;Neuromuscular re-education;Functional mobility training;Manual techniques    PT Goals (Current goals can be found in the Care Plan section)  Acute Rehab PT Goals PT Goal Formulation: Patient unable to participate in goal setting Time For Goal Achievement: 12/19/20 Potential to Achieve Goals: Poor    Frequency Min 2X/week   Barriers to discharge Decreased caregiver support;Inaccessible home environment      Co-evaluation               AM-PAC PT "6 Clicks" Mobility  Outcome Measure Help needed turning from your back to your side while in a flat bed without using bedrails?: Total Help needed moving from lying on your back to sitting on the side of a flat bed without using bedrails?: Total Help needed moving to and from a bed to a chair (including a wheelchair)?: Total Help needed standing up from a chair using your arms (e.g., wheelchair or bedside chair)?: Total Help needed to walk in hospital room?: Total Help needed climbing 3-5 steps with a railing? : Total 6 Click Score: 6    End of Session   Activity Tolerance:  (limited 2/2 impaired cognition) Patient left: in bed;with call bell/phone within reach;with family/visitor present Nurse Communication: Mobility  status PT Visit Diagnosis: Unsteadiness on feet (R26.81);Muscle weakness (generalized) (M62.81);Other abnormalities of gait and mobility (R26.89);Difficulty in walking, not elsewhere classified (R26.2)    Time: 7943-2761 PT Time Calculation (min) (ACUTE ONLY): 9 min   Charges:   PT Evaluation $PT Eval High Complexity: 1 High          Lavone Nian, PT, DPT 12/05/20, 12:40 PM   [name] 12/05/2020, 12:32 PM

## 2020-12-05 NOTE — Progress Notes (Signed)
PHARMACIST - PHYSICIAN COMMUNICATION  CONCERNING:  Enoxaparin (Lovenox) for DVT Prophylaxis    RECOMMENDATION: Patient was prescribed enoxaprin 41m q24 hours for VTE prophylaxis.   Filed Weights   12/03/20 0429 12/04/20 0434 12/05/20 0453  Weight: 102.4 kg (225 lb 12 oz) 98.1 kg (216 lb 4.3 oz) 97.2 kg (214 lb 4.6 oz)    Body mass index is 37.96 kg/m.  Estimated Creatinine Clearance: 62 mL/min (by C-G formula based on SCr of 0.91 mg/dL).   Based on CJenkinspatient is candidate for enoxaparin 0.573mkg TBW SQ every 24 hours based on BMI being >30.   DESCRIPTION: [facility]  Patient is now receiving enoxaparin 47.5 mg every 24 hours    BrNarda RutherfordPharmD Pharmacy Resident  12/05/2020 5:26 PM

## 2020-12-06 ENCOUNTER — Inpatient Hospital Stay: Payer: Medicare Other

## 2020-12-06 DIAGNOSIS — J9601 Acute respiratory failure with hypoxia: Secondary | ICD-10-CM | POA: Diagnosis not present

## 2020-12-06 LAB — CBC
HCT: 37 % (ref 36.0–46.0)
Hemoglobin: 11.7 g/dL — ABNORMAL LOW (ref 12.0–15.0)
MCH: 30 pg (ref 26.0–34.0)
MCHC: 31.6 g/dL (ref 30.0–36.0)
MCV: 94.9 fL (ref 80.0–100.0)
Platelets: 135 10*3/uL — ABNORMAL LOW (ref 150–400)
RBC: 3.9 MIL/uL (ref 3.87–5.11)
RDW: 15.9 % — ABNORMAL HIGH (ref 11.5–15.5)
WBC: 4.1 10*3/uL (ref 4.0–10.5)
nRBC: 0 % (ref 0.0–0.2)

## 2020-12-06 LAB — COMPREHENSIVE METABOLIC PANEL
ALT: 36 U/L (ref 0–44)
AST: 73 U/L — ABNORMAL HIGH (ref 15–41)
Albumin: 2.7 g/dL — ABNORMAL LOW (ref 3.5–5.0)
Alkaline Phosphatase: 79 U/L (ref 38–126)
Anion gap: 4 — ABNORMAL LOW (ref 5–15)
BUN: 32 mg/dL — ABNORMAL HIGH (ref 8–23)
CO2: 27 mmol/L (ref 22–32)
Calcium: 10.8 mg/dL — ABNORMAL HIGH (ref 8.9–10.3)
Chloride: 114 mmol/L — ABNORMAL HIGH (ref 98–111)
Creatinine, Ser: 0.82 mg/dL (ref 0.44–1.00)
GFR, Estimated: >60 mL/min (ref >60)
Glucose, Bld: 120 mg/dL — ABNORMAL HIGH (ref 70–99)
Potassium: 3.9 mmol/L (ref 3.5–5.1)
Sodium: 145 mmol/L (ref 135–145)
Total Bilirubin: 1.1 mg/dL (ref 0.3–1.2)
Total Protein: 6.1 g/dL — ABNORMAL LOW (ref 6.5–8.1)

## 2020-12-06 LAB — GLUCOSE, CAPILLARY
Glucose-Capillary: 112 mg/dL — ABNORMAL HIGH (ref 70–99)
Glucose-Capillary: 103 mg/dL — ABNORMAL HIGH (ref 70–99)
Glucose-Capillary: 104 mg/dL — ABNORMAL HIGH (ref 70–99)
Glucose-Capillary: 110 mg/dL — ABNORMAL HIGH (ref 70–99)
Glucose-Capillary: 105 mg/dL — ABNORMAL HIGH (ref 70–99)
Glucose-Capillary: 119 mg/dL — ABNORMAL HIGH (ref 70–99)

## 2020-12-06 LAB — MAGNESIUM: Magnesium: 2.2 mg/dL (ref 1.7–2.4)

## 2020-12-06 MED ORDER — GADOBUTROL 1 MMOL/ML IV SOLN
10.0000 mL | Freq: Once | INTRAVENOUS | Status: AC | PRN
  Administered 2020-12-06: 10 mL via INTRAVENOUS

## 2020-12-06 MED ORDER — POLYETHYLENE GLYCOL 3350 17 G PO PACK: 17.0000 g | PACK | Freq: Every day | ORAL | Status: DC | PRN

## 2020-12-06 MED ORDER — DOCUSATE SODIUM 50 MG/5ML PO LIQD: 100.0000 mg | Freq: Two times a day (BID) | ORAL | Status: DC | PRN

## 2020-12-06 MED ORDER — LACTULOSE 10 GM/15ML PO SOLN
30.0000 g | ORAL | Status: DC
  Administered 2020-12-06 – 2020-12-07 (×4): 30 g
  Filled 2020-12-06 (×4): qty 60

## 2020-12-06 MED ORDER — LEVOTHYROXINE SODIUM 50 MCG PO TABS
50.0000 ug | ORAL_TABLET | Freq: Every day | ORAL | Status: DC
  Administered 2020-12-07: 50 ug
  Filled 2020-12-06: qty 1

## 2020-12-06 MED ORDER — DILTIAZEM HCL 30 MG PO TABS
60.0000 mg | ORAL_TABLET | Freq: Three times a day (TID) | ORAL | Status: DC
  Administered 2020-12-06 – 2020-12-07 (×3): 60 mg
  Filled 2020-12-06 (×3): qty 2

## 2020-12-06 MED ORDER — LACTULOSE 10 GM/15ML PO SOLN
30.0000 g | Freq: Three times a day (TID) | ORAL | Status: DC
  Administered 2020-12-06: 30 g
  Filled 2020-12-06: qty 60

## 2020-12-06 MED ORDER — PANTOPRAZOLE SODIUM 40 MG PO PACK
40.0000 mg | PACK | Freq: Every day | ORAL | Status: DC
  Administered 2020-12-06 – 2020-12-07 (×2): 40 mg
  Filled 2020-12-06 (×2): qty 20

## 2020-12-06 NOTE — Evaluation (Signed)
Occupational Therapy Evaluation Patient Details Name: Gwendolyn [name] MRN: [id number] DOB: 1945/07/20 Today's Date: 12/06/2020    History of Present Illness Pt is a 75 y/o F admitted on 12/02/20 with c/c of AMS & near syncopal episode. Pt admitted for tx of possible accidental drug overdose. Chest x-ray revealed small L pleural effusion. PMH: anxiety, arthritis, asthma, breast CA (01/2010), chronic LBP, liver cirrhosis, depression, fibromyalgia, GERD, HTN, HLD, IBS, migraines, NASH, RLS, trigeminal neuralgia   Clinical Impression   Ms Mulroy was seen for OT evaluation this date. Prior to hospital admission, pt was Independent for I/ADLs and mobility living at [facility]. Pt presents to acute OT demonstrating impaired ADL performance and functional mobility 2/2 lethargy and functional strength/ROM/balance deficits.   Upon arrival pt reclined in bed with eyes intermittently opening and inconsistent responses to noxious stimuli. Pt requires TOTAL A face washing and LBD at bed level. MAX A rolling L>R. PT in to session to assist with mobility trial - pt requires TOTAL A x2 sup<>sit with no righting responses in sitting. Pt would benefit from trial of OT to address noted impairments and functional limitations to maximize safety and independence while minimizing falls risk and caregiver burden. Upon hospital discharge, recommend STR to maximize pt safety and return to PLOF.     Follow Up Recommendations  SNF;Supervision/Assistance - 24 hour    Equipment Recommendations  Other (comment) (TBD)    Recommendations for Other Services       Precautions / Restrictions Precautions Precautions: Fall Restrictions Weight Bearing Restrictions: No      Mobility Bed Mobility Overal bed mobility: Needs Assistance Bed Mobility: Supine to Sit;Sit to Supine;Rolling Rolling: Total assist   Supine to sit: Total assist;+2 for physical assistance;HOB elevated Sit to supine: Total assist;+2 for  physical assistance;HOB elevated        Transfers                 General transfer comment: deferred 2/2 lethargy    Balance Overall balance assessment: Needs assistance   Sitting balance-Leahy Scale: Zero                                     ADL either performed or assessed with clinical judgement   ADL Overall ADL's : Needs assistance/impaired                                       General ADL Comments: TOTAL A face washing and LBD at bed level. MAX A rolling L>R and TOTAL A x2 EOB sitting      Pertinent Vitals/Pain Pain Assessment: Faces Faces Pain Scale: Hurts even more Pain Location: gernealized response to sternal rub Pain Descriptors / Indicators: Grimacing;Guarding Pain Intervention(s): Limited activity within patient's tolerance;Repositioned     Hand Dominance     Extremity/Trunk Assessment Upper Extremity Assessment Upper Extremity Assessment: Generalized weakness;Difficult to assess due to impaired cognition   Lower Extremity Assessment Lower Extremity Assessment: Generalized weakness;Difficult to assess due to impaired cognition       Communication Communication Communication: Expressive difficulties;Receptive difficulties   Cognition Arousal/Alertness: Lethargic Behavior During Therapy: Flat affect Overall Cognitive Status: Impaired/Different from baseline Area of Impairment: Orientation;Memory;Safety/judgement;Attention;Following commands;Awareness;Problem solving                 Orientation Level: Disoriented to;Person;Place;Time;Situation  Memory: Decreased recall of precautions;Decreased short-term memory Following Commands: Follows one step commands inconsistently Safety/Judgement: Decreased awareness of deficits;Decreased awareness of safety   Problem Solving: Decreased initiation;Difficulty sequencing;Requires verbal cues;Requires tactile cues;Slow processing General Comments: pt opens eyes  intermittently but does not track therapist. inconsistently responding to pain   General Comments       Exercises Exercises: Other exercises Other Exercises Other Exercises: Pt educated re: OT role, delirium pcns Other Exercises: face wahsing, LBD, sup<>sit, rolling   Shoulder Instructions      Home Living Family/patient expects to be discharged to:: Other (Comment) ([place]) Living Arrangements: Alone                               Additional Comments: Per chart pt was a resident at [facility].      Prior Functioning/Environment Level of Independence: Independent                 OT Problem List: Decreased activity tolerance;Decreased strength;Decreased range of motion;Impaired balance (sitting and/or standing);Decreased coordination;Decreased cognition;Decreased safety awareness      OT Treatment/Interventions: Self-care/ADL training;Therapeutic exercise;Energy conservation;DME and/or AE instruction;Cognitive remediation/compensation;Therapeutic activities;Patient/family education;Visual/perceptual remediation/compensation;Balance training    OT Goals(Current goals can be found in the care plan section) Acute Rehab OT Goals Patient Stated Goal: unable to state OT Goal Formulation: Patient unable to participate in goal setting Time For Goal Achievement: 12/20/20 Potential to Achieve Goals: Fair ADL Goals Pt Will Perform Eating: with mod assist;bed level Pt Will Perform Grooming: with max assist;sitting Pt Will Transfer to Toilet: with mod assist (rolling at bed level)  OT Frequency: Min 1X/week   Barriers to D/C: Decreased caregiver support          Co-evaluation              AM-PAC OT "6 Clicks" Daily Activity     Outcome Measure Help from another person eating meals?: Total Help from another person taking care of personal grooming?: Total Help from another person toileting, which includes using toliet, bedpan,  or urinal?: Total Help from another person bathing (including washing, rinsing, drying)?: Total Help from another person to put on and taking off regular upper body clothing?: Total Help from another person to put on and taking off regular lower body clothing?: Total 6 Click Score: 6   End of Session Nurse Communication: Mobility status  Activity Tolerance: Patient limited by lethargy Patient left: in bed;with bed alarm set;with call bell/phone within reach  OT Visit Diagnosis: Other abnormalities of gait and mobility (R26.89);Muscle weakness (generalized) (M62.81);Other symptoms and signs involving the nervous system (R29.898)                Time: 1700-1749 OT Time Calculation (min): 20 min Charges:  OT General Charges $OT Visit: 1 Visit OT Evaluation $OT Eval Low Complexity: 1 Low  Dessie Coma, M.S. OTR/L  12/06/20, 9:42 AM  ascom [phone]

## 2020-12-06 NOTE — Progress Notes (Signed)
Nutrition Follow-up  DOCUMENTATION CODES:  Obesity unspecified  INTERVENTION:  Recommend initiating the following TF regimen: Vital 1.5 at 59m/h (10852mtotal volume) with 1 prosource TF daily (40kcal and 11g of protein per packet) 10066mree water flush q4h Regimen will provide 1660kcal, 84g of protein, and 1425m60mee water (TF+flush) Liquid MVI via tube daily as TF infusion rate is inadequate to meet RDIs  NUTRITION DIAGNOSIS:  Inadequate oral intake related to lethargy/confusion, inability to eat as evidenced by NPO status.  GOAL:  Patient will meet greater than or equal to 90% of their needs  MONITOR:  PO intake, Supplement acceptance, Skin, Labs  REASON FOR ASSESSMENT:  Ventilator, Malnutrition Screening Tool    ASSESSMENT:  74 y34 female with h/o breast cancer, NASH, cirrhosis, asthma, depresison, anxiety, GERD, HTN, HLD, IBS, roux-en-y gastric bypass 2009 and fibromyalgia who is admitted with AMS requiring intubation for airway protection in the setting of toxic-metabolic encephalopathy after apparent unintentional multidrug overdose of multiple prescription medications.  Pt extubated 8/19 and diet advanced 8/20. However, pt with poor mental status and unable to safely take in POs. NGT placed today and confirmed gastric. Will place TF recommendations above as pt has been without adequate nutrition throughout admission. Elevated Calcium today likely due to dehydration.    Intake/Output Summary (Last 24 hours) at 12/06/2020 1423 Last data filed at 12/06/2020 0800 Gross per 24 hour  Intake --  Output 750 ml  Net -750 ml  Net IO Since Admission: -2,316.97 mL [12/06/20 1423]  Nutritionally Relevant Medications: Scheduled Meds:  lactulose  20 g Oral TID   levothyroxine  50 mcg Oral Q0600   pantoprazole  40 mg Oral Daily   PRN Meds: docusate sodium, polyethylene glycol  Labs Reviewed: BUN 32 Calcium corrects to 11.8 mg/dL  NUTRITION - FOCUSED PHYSICAL  EXAM: Flowsheet Row Most Recent Value  Orbital Region No depletion  Upper Arm Region No depletion  Thoracic and Lumbar Region No depletion  Buccal Region No depletion  Temple Region Mild depletion  Clavicle Bone Region No depletion  Clavicle and Acromion Bone Region No depletion  Scapular Bone Region No depletion  Dorsal Hand No depletion  Patellar Region No depletion  Anterior Thigh Region No depletion  Posterior Calf Region No depletion  Edema (RD Assessment) None  Hair Reviewed  Eyes Reviewed  Mouth Reviewed  Skin Reviewed  Nails Reviewed   Diet Order:   Diet Order             Diet NPO time specified  Diet effective now                  EDUCATION NEEDS:  No education needs have been identified at this time  Skin:  Skin Assessment: Reviewed RN Assessment  Last BM:  8/21 - type 7  Height:  Ht Readings from Last 1 Encounters:  12/02/20 5' 3"  (1.6 m)    Weight:  Wt Readings from Last 1 Encounters:  12/05/20 97.2 kg    Ideal Body Weight:  52.3 kg  BMI:  Body mass index is 37.96 kg/m.  Estimated Nutritional Needs:  Kcal:  1600-1800 kcal/d Protein:  80-90 g/d Fluid:  1600-1800 mL/d   RachRanell Patrick, LDN Clinical Dietitian Pager on [facility]

## 2020-12-06 NOTE — Progress Notes (Signed)
Dr. Damita Dunnings notified that patient has been having 2-3 second pauses, last one called was 2.39 seconds with HR down to 37. New orders entered for BMP, Magnesium and ok to draw morning labs, if labs WNL then MD will cancel AM labs.Continue to assess and monitor.

## 2020-12-06 NOTE — Progress Notes (Signed)
Physical Therapy Treatment Patient Details Name: Gwendolyn [name] MRN: [id number] DOB: [birth year] Today's Date: 12/06/2020    History of Present Illness Pt is a 75 y/o F admitted on 12/02/20 with c/c of AMS & near syncopal episode. Pt admitted for tx of possible accidental drug overdose. Chest x-ray revealed small L pleural effusion. PMH: anxiety, arthritis, asthma, breast CA (01/2010), chronic LBP, liver cirrhosis, depression, fibromyalgia, GERD, HTN, HLD, IBS, migraines, NASH, RLS, trigeminal neuralgia    PT Comments    Pt seen for co-tx with OT. Pt received in bed, very lethargic with minimal eye opening to sternal rub. Utilized +2 dependent assist to sit pt EOB to attempt to increase alertness with no success. PT attempts to perform PROM to BLE & LUE joints with pt appearing somewhat resistive to movement/rigid joints.  Notified MD of pt's impaired alertness & cognition for 3 PT attempts thus far with MD planning to order CT scan.   Due to pt's limited ability to participate in PT treatment, will continue PT on a trial basis. If pt remains the same during next PT attempt, will then sign off until pt becomes more medically appropriate.     Follow Up Recommendations  SNF;Supervision/Assistance - 24 hour     Equipment Recommendations  None recommended by PT    Recommendations for Other Services       Precautions / Restrictions Precautions Precautions: Fall Restrictions Weight Bearing Restrictions: No    Mobility  Bed Mobility Overal bed mobility: Needs Assistance Bed Mobility: Supine to Sit;Sit to Supine;Rolling Rolling: Total assist;+2 for physical assistance   Supine to sit: Total assist;+2 for physical assistance Sit to supine: Total assist;+2 for physical assistance        Transfers                 General transfer comment: deferred 2/2 lethargy  Ambulation/Gait                 Stairs             Wheelchair Mobility    Modified Rankin  (Stroke Patients Only)       Balance Overall balance assessment: Needs assistance Sitting-balance support: Feet supported;Bilateral upper extremity supported Sitting balance-Leahy Scale: Zero   Postural control: Posterior lean                                  Cognition Arousal/Alertness: Lethargic Behavior During Therapy: Flat affect Overall Cognitive Status: Impaired/Different from baseline Area of Impairment: Orientation;Memory;Safety/judgement;Attention;Following commands;Awareness;Problem solving                 Orientation Level: Disoriented to;Person;Place;Time;Situation   Memory: Decreased recall of precautions;Decreased short-term memory Following Commands: Follows one step commands inconsistently Safety/Judgement: Decreased awareness of deficits;Decreased awareness of safety Awareness: Anticipatory;Emergent;Intellectual Problem Solving: Decreased initiation;Difficulty sequencing;Requires verbal cues;Requires tactile cues;Slow processing General Comments: pt opens eyes intermittently but does not track therapist. inconsistently responding to pain      Exercises Other Exercises Other Exercises: Pt educated re: OT role, delirium pcns Other Exercises: face wahsing, LBD, sup<>sit, rolling    General Comments        Pertinent Vitals/Pain Pain Assessment: Faces Faces Pain Scale: Hurts little more Pain Location: gernealized response to sternal rub Pain Descriptors / Indicators: Grimacing;Guarding Pain Intervention(s): Repositioned;Limited activity within patient's tolerance    Home Living Family/patient expects to be discharged to:: Other (Comment) ([facility]) Living Arrangements: Alone  Additional Comments: Per chart pt was a resident at [facility].    Prior Function Level of Independence: Independent          PT Goals (current goals can now be found in the care plan section) Acute Rehab PT  Goals Patient Stated Goal: unable to state PT Goal Formulation: Patient unable to participate in goal setting Time For Goal Achievement: 12/19/20 Potential to Achieve Goals: Poor    Frequency    Min 2X/week      PT Plan Current plan remains appropriate    Co-evaluation PT/OT/SLP Co-Evaluation/Treatment: Yes Reason for Co-Treatment: Complexity of the patient's impairments (multi-system involvement);Necessary to address cognition/behavior during functional activity;For patient/therapist safety PT goals addressed during session: Mobility/safety with mobility;Balance;Strengthening/ROM        AM-PAC PT "6 Clicks" Mobility   Outcome Measure  Help needed turning from your back to your side while in a flat bed without using bedrails?: Total Help needed moving from lying on your back to sitting on the side of a flat bed without using bedrails?: Total Help needed moving to and from a bed to a chair (including a wheelchair)?: Total Help needed standing up from a chair using your arms (e.g., wheelchair or bedside chair)?: Total Help needed to walk in hospital room?: Total Help needed climbing 3-5 steps with a railing? : Total 6 Click Score: 6    End of Session   Activity Tolerance:  (limited 2/2 impaired awareness) Patient left: in bed;with call bell/phone within reach;with nursing/sitter in room;with bed alarm set   PT Visit Diagnosis: Unsteadiness on feet (R26.81);Muscle weakness (generalized) (M62.81);Other abnormalities of gait and mobility (R26.89);Difficulty in walking, not elsewhere classified (R26.2)     Time: 0912-0921 PT Time Calculation (min) (ACUTE ONLY): 9 min  Charges:  $Therapeutic Activity: 8-22 mins                     Gwendolyn [name], PT, DPT 12/06/20, 9:46 AM    [name] 12/06/2020, 9:44 AM

## 2020-12-06 NOTE — Progress Notes (Signed)
PROGRESS NOTE    Gwendolyn [name]  TGY:[id number] DOB: 1945/05/13 DOA: 12/02/2020 PCP: Barbaraann Boys, MD  [id number]/[id number]   Assessment & Plan:   Active Problems:   Acute respiratory failure with hypoxia ([place])   75 y.o female with significant PMH as below who presented to the ED with chief complaints of altered mental status and near syncopal episode.   Patient is currently intubated and sedated, history obtained from patient's POA/friend who is currently at the bedside.  POA, patient recently moved to an assisted living at [place].  Patient apparently has not been taking her home medication which consist of Lexapro 20 mg, Requip, Cymbalta, Lyrica, and buprenorphine for unknown period of time.  Per POA who manages her medication, today was the first time the patient took her medications.  Patient's POA states that they were walking 30 minutes after she took her medication around 330 when she suddenly complained of feeling dizzy near syncope.   Patient was able to sit down but was soon noted to be somnolent, clammy and incoherent.  POA also stated that patient's eyes appeared to be rolling backwards but did not lose consciousness entirely.  EMS was called and patient was transported to the ED.    Given concerns for possible accidental overdose, patient was intubated for airway protection and respiratory failure.  PCCM consulted for admission to ICU.  Pt was extubated on 8/19 and transferred to hospitalist service on 8/21.   Intubated for airway protection -Extubated 8/19 --currently on RA   Acute encephalopathy in the setting of unintentional multidrug overdose --pt remained obtunded 4 days after drug ingestion. --CT head today, unremarkable Plan: --MRI brain  --treat for presumed hepatic encephalopathy   Elevated ammonia --ammonia 50's.  Liver appeared cirrhotic.  Reported AMS seemed too sudden to be from hepatic encephalopathy, but started treatment with  lactulose. Plan: --Place NG tube to administer lactulose   Hx of CVA, and mild cognitive impairment  Paroxysmal Afib --d/c Lopressor 12.5 mg BID (new) --switch to liquid dilt 60 q8h  HTN --BP varied widely --d/c Lopressor 12.5 mg BID (new) --switch to liquid dilt 60 q8h   Anxiety and depression Chronic pain -on home Cymbalta, Lyrica, Lexapro and buprenorphine --cont to hold   Restless leg syndrome -Hold Requip   Hypothyroidism -free T4 wnl --cont home Synthroid   DVT prophylaxis: Lovenox SQ Code Status: Full code  Family Communication: Friend healthcare POA Katie updated at bedside today  Level of care: Stepdown Dispo:   The patient is from: home Anticipated d/c is to: SNF Anticipated d/c date is: >3 days Patient currently is not medically ready to d/c due to: obtunded, unclear etiology   Subjective and Interval History:  Pt continued to be obtunded.  Could not be aroused.  CT head neg.  Still hasn't had a BM.  NG tube placed to administer lactulose.   Objective: Vitals:   12/06/20 1200 12/06/20 1400 12/06/20 1600 12/06/20 1700  BP: (!) 161/89 (!) 164/93 (!) 153/84 (!) 171/89  Pulse: (!) 56 80 80 94  Resp: _0 Temp:  99.3 F (37.4 C)    TempSrc:  Axillary    SpO2: 96% 97% 96% 96%  Weight:      Height:        Intake/Output Summary (Last 24 hours) at 12/06/2020 1824 Last data filed at 12/06/2020 1800 Gross per 24 hour  Intake 120 ml  Output 1000 ml  Net -880 ml   [employer]  12/03/20 0429 12/04/20 0434 12/05/20 0453  Weight: 102.4 kg 98.1 kg 97.2 kg    Examination:   Constitutional: obtunded HEENT: conjunctivae and lids normal, pupils equal CV: No cyanosis.   RESP: normal respiratory effort, on RA Extremities: No effusions, edema in BLE SKIN: warm, dry   Data Reviewed: I have personally reviewed following labs and imaging studies  CBC: Recent Labs  Lab 12/02/20 1729 12/03/20 0429 12/04/20 0618 12/05/20 0332  12/06/20 0032  WBC 4.8 4.9 6.4 5.2 4.1  HGB 13.1 11.5* 11.8* 12.2 11.7*  HCT 40.0 35.1* 37.7 39.5 37.0  MCV 94.1 94.9 97.2 97.1 94.9  PLT 185 128* 123* 154 532*   Basic Metabolic Panel: Recent Labs  Lab 12/02/20 1729 12/03/20 0429 12/03/20 1620 12/04/20 0618 12/05/20 0332 12/05/20 1351 12/06/20 0032  NA 137 137  --  139 142  --  145  K 3.8 4.7  --  4.7 5.2* 5.0 3.9  CL 106 110  --  108 111  --  114*  CO2 23 22  --  24 26  --  27  GLUCOSE 141* 104*  --  118* 128*  --  120*  BUN 15 17  --  27* 34*  --  32*  CREATININE 0.64 0.95  --  0.94 0.91  --  0.82  CALCIUM 9.8 9.1  --  9.7 10.3  --  10.8*  MG  --  2.0 2.1 2.1 2.4  --  2.2  PHOS  --  4.8* 5.2* 3.8 3.5  --   --    GFR: Estimated Creatinine Clearance: 68.8 mL/min (by C-G formula based on SCr of 0.82 mg/dL). Liver Function Tests: Recent Labs  Lab 12/02/20 1729 12/04/20 0618 12/05/20 0332 12/06/20 0032  AST 99* 77* 93* 73*  ALT 42 34 37 36  ALKPHOS 112 75 83 79  BILITOT 1.0 1.1 1.5* 1.1  PROT 7.4 6.1* 6.7 6.1*  ALBUMIN 3.5 2.9* 2.9* 2.7*   No results for input(s): LIPASE, AMYLASE in the last 168 hours. Recent Labs  Lab 12/03/20 0429 12/04/20 0618  AMMONIA 52* 58*   Coagulation Profile: Recent Labs  Lab 12/04/20 0857  INR 1.1   Cardiac Enzymes: No results for input(s): CKTOTAL, CKMB, CKMBINDEX, TROPONINI in the last 168 hours. BNP (last 3 results) No results for input(s): PROBNP in the last 8760 hours. HbA1C: No results for input(s): HGBA1C in the last 72 hours. CBG: Recent Labs  Lab 12/05/20 2301 12/06/20 0310 12/06/20 0736 12/06/20 1146 12/06/20 1611  GLUCAP 117* 112* 103* 104* 110*   Lipid Profile: No results for input(s): CHOL, HDL, LDLCALC, TRIG, CHOLHDL, LDLDIRECT in the last 72 hours. Thyroid Function Tests: No results for input(s): TSH, T4TOTAL, FREET4, T3FREE, THYROIDAB in the last 72 hours.  Anemia Panel: Recent Labs    12/04/20 0857  VITAMINB12 351  FOLATE 10.1  FERRITIN 27   TIBC 336  IRON 50   Sepsis Labs: No results for input(s): PROCALCITON, LATICACIDVEN in the last 168 hours.  Recent Results (from the past 240 hour(s))  Resp Panel by RT-PCR (Flu A&B, Covid) Nasopharyngeal Swab     Status: None   Collection Time: 12/02/20  5:55 PM   Specimen: Nasopharyngeal Swab; Nasopharyngeal(NP) swabs in vial transport medium  Result Value Ref Range Status   SARS Coronavirus 2 by RT PCR NEGATIVE NEGATIVE Final    Comment: (NOTE) SARS-CoV-2 target nucleic acids are NOT DETECTED.  The SARS-CoV-2 RNA is generally detectable in upper respiratory specimens during the acute  phase of infection. The lowest concentration of SARS-CoV-2 viral copies this assay can detect is 138 copies/mL. A negative result does not preclude SARS-Cov-2 infection and should not be used as the sole basis for treatment or other patient management decisions. A negative result may occur with  improper specimen collection/handling, submission of specimen other than nasopharyngeal swab, presence of viral mutation(s) within the areas targeted by this assay, and inadequate number of viral copies(<138 copies/mL). A negative result must be combined with clinical observations, patient history, and epidemiological information. The expected result is Negative.  Fact Sheet for Patients:  EntrepreneurPulse.com.au  Fact Sheet for Healthcare Providers:  IncredibleEmployment.be  This test is no t yet approved or cleared by the [place] FDA and  has been authorized for detection and/or diagnosis of SARS-CoV-2 by FDA under an Emergency Use Authorization (EUA). This EUA will remain  in effect (meaning this test can be used) for the duration of the COVID-19 declaration under Section 564(b)(1) of the Act, 21 U.S.C.section 360bbb-3(b)(1), unless the authorization is terminated  or revoked sooner.       Influenza A by PCR NEGATIVE NEGATIVE Final   Influenza B by PCR  NEGATIVE NEGATIVE Final    Comment: (NOTE) The Xpert Xpress SARS-CoV-2/FLU/RSV plus assay is intended as an aid in the diagnosis of influenza from Nasopharyngeal swab specimens and should not be used as a sole basis for treatment. Nasal washings and aspirates are unacceptable for Xpert Xpress SARS-CoV-2/FLU/RSV testing.  Fact Sheet for Patients: EntrepreneurPulse.com.au  Fact Sheet for Healthcare Providers: IncredibleEmployment.be  This test is not yet approved or cleared by the [place] FDA and has been authorized for detection and/or diagnosis of SARS-CoV-2 by FDA under an Emergency Use Authorization (EUA). This EUA will remain in effect (meaning this test can be used) for the duration of the COVID-19 declaration under Section 564(b)(1) of the Act, 21 U.S.C. section 360bbb-3(b)(1), unless the authorization is terminated or revoked.  Performed at [facility], [address] [place], [place] [address]   MRSA Next Gen by PCR, Nasal     Status: None   Collection Time: 12/02/20  9:39 PM   Specimen: Nasal Mucosa; Nasal Swab  Result Value Ref Range Status   MRSA by PCR Next Gen NOT DETECTED NOT DETECTED Final    Comment: (NOTE) The GeneXpert MRSA Assay (FDA approved for NASAL specimens only), is one component of a comprehensive MRSA colonization surveillance program. It is not intended to diagnose MRSA infection nor to guide or monitor treatment for MRSA infections. Test performance is not FDA approved in patients less than 6 years old. Performed at [facility], [address] [place], [place] [address]       Radiology Studies: DG Abd 1 View  Result Date: 12/06/2020 CLINICAL DATA:  Nasogastric tube placement EXAM: ABDOMEN - 1 VIEW COMPARISON:  Earlier film of the same day FINDINGS: The nasogastric tube has been advanced into the stomach which is partially decompressed. Multiple gas distended nondilated small  bowel loops are noted in the mid abdomen. The lower abdomen is excluded. Cholecystectomy clips. Thoracolumbar scoliosis. IMPRESSION: Nasogastric tube into decompressed stomach. Electronically Signed   By: Lucrezia Europe M.D.   On: 12/06/2020 13:41   DG Abd 1 View  Result Date: 12/06/2020 CLINICAL DATA:  NG tube placement EXAM: ABDOMEN - 1 VIEW COMPARISON:  None. FINDINGS: Nasogastric tube with the tip projecting over the distal esophagus 8 cm above the GE junction. Recommend advancing the nasogastric tube 18 cm.  No bowel dilatation to suggest obstruction. No evidence of pneumoperitoneum, portal venous gas or pneumatosis. No pathologic calcifications along the expected course of the ureters. Severe dextroscoliosis of the thoracolumbar spine. IMPRESSION: Nasogastric tube with the tip projecting over the distal esophagus 8 cm above the GE junction. Recommend advancing the nasogastric tube 18 cm. Electronically Signed   By: Kathreen Devoid M.D.   On: 12/06/2020 12:36   CT HEAD WO CONTRAST (5MM)  Result Date: 12/06/2020 CLINICAL DATA:  Somnolent, unresponsive, history cirrhosis, hypertension, NASH EXAM: CT HEAD WITHOUT CONTRAST TECHNIQUE: Contiguous axial images were obtained from the base of the skull through the vertex without intravenous contrast. Sagittal and coronal MPR images reconstructed from axial data set. COMPARISON:  12/02/2020 FINDINGS: Brain: Examination mildly degraded by motion artifacts. Mild atrophy. Normal ventricular morphology. No midline shift or mass effect. Dilated perivascular space at inferior RIGHT basal ganglia unchanged. Otherwise normal appearance of brain parenchyma. No intracranial hemorrhage, mass lesion, or evidence of acute infarction. No extra-axial fluid collections. Vascular: No hyperdense vessels. Mild atherosclerotic calcifications of vertebral arteries at skull base. Skull: Demineralized but grossly intact within limitations of motion Sinuses/Orbits: Clear Other: N/A IMPRESSION:  No acute intracranial abnormalities. Electronically Signed   By: Lavonia Dana M.D.   On: 12/06/2020 12:38     Scheduled Meds:  chlorhexidine gluconate (MEDLINE KIT)  15 mL Mouth Rinse BID   Chlorhexidine Gluconate Cloth  6 each Topical Q0600   diltiazem  60 mg Oral Q8H   enoxaparin (LOVENOX) injection  0.5 mg/kg Subcutaneous Q24H   lactulose  30 g Per Tube TID   levothyroxine  50 mcg Oral Q0600   mouth rinse  15 mL Mouth Rinse 10 times per day   pantoprazole sodium  40 mg Per Tube Daily   Continuous Infusions:  sodium chloride Stopped (12/03/20 0427)     LOS: 4 days     Enzo Bi, MD [facility] If 7PM-7AM, please contact night-coverage 12/06/2020, 6:24 PM

## 2020-12-06 NOTE — Progress Notes (Signed)
[place] for Electrolyte Monitoring and Replacement   Recent Labs: Potassium (mmol/L)  Date Value  12/06/2020 3.9   Magnesium (mg/dL)  Date Value  12/06/2020 2.2   Calcium (mg/dL)  Date Value  12/06/2020 10.8 (H)   Albumin (g/dL)  Date Value  12/06/2020 2.7 (L)   Phosphorus (mg/dL)  Date Value  12/05/2020 3.5   Sodium (mmol/L)  Date Value  12/06/2020 145   Assessment: 75 year old female in the ICU intubated for airway protection in the setting of encephalopathy likely s/t unintentional overdose of several home medications. Pharmacy consult to assist in electrolyte replacement while in the ICU.  Goal of Therapy:  Electrolytes WNL  Plan:  --Mild hyperkalemia resolved with Kayexalate yesterday --Corrected calcium 11.8 mg/dL. Un-clear etiology. Possibly secondary to dehydration. Patient is on lactulose. Defer management to primary provider --No electrolyte replacement warranted at this time --Will continue to monitor  Benita Gutter 12/06/2020 11:40 AM

## 2020-12-07 ENCOUNTER — Other Ambulatory Visit: Payer: Self-pay

## 2020-12-07 DIAGNOSIS — Z66 Do not resuscitate: Secondary | ICD-10-CM

## 2020-12-07 DIAGNOSIS — R4182 Altered mental status, unspecified: Secondary | ICD-10-CM | POA: Diagnosis not present

## 2020-12-07 DIAGNOSIS — Z515 Encounter for palliative care: Secondary | ICD-10-CM

## 2020-12-07 DIAGNOSIS — J9601 Acute respiratory failure with hypoxia: Secondary | ICD-10-CM | POA: Diagnosis not present

## 2020-12-07 LAB — CBC
HCT: 37.2 % (ref 36.0–46.0)
Hemoglobin: 12.2 g/dL (ref 12.0–15.0)
MCH: 31.3 pg (ref 26.0–34.0)
MCHC: 32.8 g/dL (ref 30.0–36.0)
MCV: 95.4 fL (ref 80.0–100.0)
Platelets: 135 10*3/uL — ABNORMAL LOW (ref 150–400)
RBC: 3.9 MIL/uL (ref 3.87–5.11)
RDW: 15.5 % (ref 11.5–15.5)
WBC: 3.6 10*3/uL — ABNORMAL LOW (ref 4.0–10.5)
nRBC: 0 % (ref 0.0–0.2)

## 2020-12-07 LAB — BASIC METABOLIC PANEL
Anion gap: 9 (ref 5–15)
BUN: 29 mg/dL — ABNORMAL HIGH (ref 8–23)
CO2: 30 mmol/L (ref 22–32)
Calcium: 10.8 mg/dL — ABNORMAL HIGH (ref 8.9–10.3)
Chloride: 111 mmol/L (ref 98–111)
Creatinine, Ser: 0.78 mg/dL (ref 0.44–1.00)
GFR, Estimated: >60 mL/min (ref >60)
Glucose, Bld: 121 mg/dL — ABNORMAL HIGH (ref 70–99)
Potassium: 3.4 mmol/L — ABNORMAL LOW (ref 3.5–5.1)
Sodium: 150 mmol/L — ABNORMAL HIGH (ref 135–145)

## 2020-12-07 LAB — MAGNESIUM: Magnesium: 2.1 mg/dL (ref 1.7–2.4)

## 2020-12-07 LAB — GLUCOSE, CAPILLARY
Glucose-Capillary: 104 mg/dL — ABNORMAL HIGH (ref 70–99)
Glucose-Capillary: 106 mg/dL — ABNORMAL HIGH (ref 70–99)
Glucose-Capillary: 132 mg/dL — ABNORMAL HIGH (ref 70–99)

## 2020-12-07 MED ORDER — VITAL 1.5 CAL PO LIQD
1000.0000 mL | ORAL | Status: DC
  Administered 2020-12-07: 1000 mL

## 2020-12-07 MED ORDER — PROSOURCE TF PO LIQD
45.0000 mL | Freq: Every day | ORAL | Status: DC
  Administered 2020-12-07: 45 mL
  Filled 2020-12-07: qty 45

## 2020-12-07 MED ORDER — MORPHINE SULFATE (PF) 2 MG/ML IV SOLN
2.0000 mg | INTRAVENOUS | Status: DC
  Administered 2020-12-07 – 2020-12-09 (×10): 2 mg via INTRAVENOUS
  Filled 2020-12-07 (×10): qty 1

## 2020-12-07 MED ORDER — POTASSIUM CHLORIDE 20 MEQ PO PACK
40.0000 meq | PACK | Freq: Once | ORAL | Status: AC
  Administered 2020-12-07: 40 meq
  Filled 2020-12-07: qty 2

## 2020-12-07 MED ORDER — MORPHINE SULFATE (PF) 2 MG/ML IV SOLN: 1.0000 mg | INTRAVENOUS | Status: DC | PRN

## 2020-12-07 MED ORDER — LACTULOSE 10 GM/15ML PO SOLN: 30.0000 g | Freq: Three times a day (TID) | ORAL | Status: DC

## 2020-12-07 MED ORDER — FREE WATER
150.0000 mL | Status: DC
Start: 2020-12-07 — End: 2020-12-07
  Administered 2020-12-07 (×2): 150 mL

## 2020-12-07 NOTE — Consult Note (Addendum)
Consultation Note Date: 12/07/2020   Patient Name: Gwendolyn [name]  DOB: 12-24-1945  MRN: [id number]  Age / Sex: 75 y.o., female  PCP: Barbaraann Boys, MD Referring Physician: Enzo Bi, MD  Reason for Consultation: Establishing goals of care and Psychosocial/spiritual support  HPI/Patient Profile: 75 y.o. female  admitted on 12/02/2020 with past medical history significant for GERD, hypertension, breast cancer, nonalcoholic liver cirrhosis, anxiety and depression, asthma, chronic pain , hyperlipidemia, hypothyroidism, history of CVA, anxiety and depression.  She was admitted through the emergency room with altered mental status secondary to possible repeat consumption of her daily meds.  According to [facility] POA patient has had ongoing intermittent cognitive changes over the past many months.   Patient was intubated in the emergency room for airway protection. She was successfully extubated on 8/19, she remains encephalopathic, unable to follow commands, today is day 4 of this hospitalization.  Treatment option decisions, advanced directive decisions and anticipatory care needs,  need to be addressed.      ----------------------------------------------------------------------------------------------  In-depth review of patient's legal advance care planning documents in the chart.  Clearly that the patient's lifelong friend Tawni Millers is documented HPOA and [place].  There is a second person listed Stanton Kidney "Joellen Jersey" Delford Field who is listed as a secondary.    Tawni Millers is adamant and clear that she is to be called with any medical decisions first and foremost.    According to [facility] patient does have 2 sons/Kevin and [name] , however they are not directly involved in the patient's care or involved in any medical decisions.    __________________________________________________________       Clinical Assessment and Goals of Care:  This NP Wadie Lessen reviewed medical records, received report from team, assessed the patient and then meet at the patient's bedside  along with Armiyah Capron [facility] "Anda Kraft" Delford Field and Tawni Millers HPOA to discuss diagnosis, prognosis, GOC, EOL wishes disposition and options.   Concept of Palliative Care was introduced as specialized medical care for people and their families living with serious illness.  If focuses on providing relief from the symptoms and stress of a serious illness.  The goal is to improve quality of life for both the patient and the family.  Values and goals of care important to patient and family were attempted to be elicited.  Created space and opportunity for support persons to explore thoughts and feelings regarding current medical situation.  Both Ivin Booty and Joellen Jersey clearly verbalize an understanding of what patient would want for herself at this time.  Both are very clear and comfortable verbalizing that Dametra has always made it clear that she did not want heroic life prolonging measures if it was unlikely that she would return to her baseline.  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   She never wanted resuscitative measures or artificial feeding of any kind.     A  discussion was had today regarding advanced  directives.  Concepts specific to code status, artifical feeding and hydration, continued IV antibiotics and rehospitalization was had.    The difference between a aggressive medical intervention path  and a palliative comfort care path for this patient at this time was had.    Pan of care: Focus of care is comfort, quality and dignity--allow a natural death -DNR/DNI -No artificial feeding or hydration now or in the future/ dc cor-trac -No further diagnostics, no  further life prolonging measures, no IV antibiotic use -Symptom management for comfort      -morphine IV 2 mg every 4 hours for underlying chronic pain      -morphine IV 1 mg every 1 hour as needed for breakthrough pain or dyspnea -May transfer out of ICU to regular unit -Reassess in the morning for residential hospice placement      Education offered on natural trajectory and expectations at EOL.    Questions and concerns addressed.  Patient  encouraged to call with questions or concerns.     PMT will continue to support holistically.           Documented H POA and DURABLE POWER OF ATTORNEY is [employer] as listed inpatient contacts.  See document in patient's hard chart    SUMMARY OF RECOMMENDATIONS    Code Status/Advance Care Planning: DNR-documented today   Symptom Management:  Morphine 2 mg IV scheduled every 4 hours for underlying chronic pain Morphine 1 mg IV every 1 hour as needed for breakthrough pain or dyspnea  Palliative Prophylaxis:  Eye Care, Frequent Pain Assessment, and Oral Care  Additional Recommendations (Limitations, Scope, Preferences): Full Comfort Care  Psycho-social/Spiritual:  Desire for further Chaplaincy support:no Additional Recommendations: Education on Hospice  Prognosis:  < 2 weeks  Discharge Planning: Hospice facility      Primary Diagnoses: Present on Admission:  Acute respiratory failure with hypoxia ([place])   I have reviewed the medical record, interviewed the patient and family, and examined the patient. The following aspects are pertinent.  Past Medical History:  Diagnosis Date   Anxiety    Arthritis    Asthma    Breast cancer ([place]) 01/2010   left mastectomy, LN neg, on femara   Chronic low back pain    Cirrhosis of liver (H[facility])    Depression    Fibromyalgia    GERD (gastroesophageal reflux disease)    History of chicken pox    HTN (hypertension)    Hyperlipidemia    Hypothyroidism    IBS (irritable bowel  syndrome)    Migraines    NASH (nonalcoholic steatohepatitis)    Follow[facility] 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      Regular Exercise -  Yes, water aerobics 3 times a week   Daily Caffeine Use:  NO            Social Determinants of Health   Financial Resource Strain: Not on file  Food Insecurity: Not on file  Transportation Needs: Not on file  Physical Activity: Not on file  Stress: Not on file  Social Connections: Not on file   Family History  Problem Relation Age of Onset   COPD Mother    Alzheimer's disease Father    Cancer Sister 80       Breast   Arthritis Maternal Grandmother    Arthritis Maternal Grandfather    Scheduled Meds:  chlorhexidine gluconate (MEDLINE KIT)  15 mL Mouth Rinse BID   Chlorhexidine Gluconate Cloth  6 each Topical Q0600   mouth rinse  15 mL Mouth Rinse 10 times per day    morphine injection  2 mg Intravenous Q4H   Continuous Infusions:  sodium chloride Stopped (12/03/20 0427)   PRN Meds:. Medications Prior to Admission:  Prior to Admission medications   Medication Sig Start Date End Date Taking? Authorizing Provider  buprenorphine (SUBUTEX) 2 MG SUBL SL tablet Place 4 mg under the tongue daily.   Yes [provider]  calcium  carbonate (OS-CAL - DOSED IN MG OF ELEMENTAL CALCIUM) 1250 (500 Ca) MG tablet Take 1 tablet by mouth daily.   Yes [provider]  Cholecalciferol (VITAMIN D) 50 MCG (2000 UT) tablet Take 2,000 Units by mouth daily.   Yes [provider]  DULoxetine (CYMBALTA) 20 MG capsule Take 20 mg by mouth daily.   Yes [provider]  enalapril (VASOTEC) 10 MG tablet Hold this until outpatient PCP followup, because you had low blood pressure and kidney injury when you presented to the hospital. 07/19/19  Yes Enzo Bi, MD  escitalopram (LEXAPRO) 20 MG tablet Take 20 mg by mouth daily.  07/19/17  Yes [provider]  levothyroxine (SYNTHROID, LEVOTHROID) 50 MCG tablet Take 1 tablet (50 mcg total) by mouth daily. 07/08/13  Yes Jackolyn Confer, MD  omeprazole (PRILOSEC) 20 MG capsule Take 20 mg by mouth daily.   Yes [provider]  pregabalin (LYRICA) 50 MG capsule Take 50-100 mg by mouth 3 (three) times daily.   Yes [provider]  rOPINIRole (REQUIP) 2 MG tablet Take 2 mg by mouth 3 (three) times daily.   Yes [provider]  acetaminophen (TYLENOL) 500 MG tablet Take 1,000 mg by mouth every 6 (six) hours as needed for moderate pain or headache.    [provider]  albuterol (PROVENTIL HFA;VENTOLIN HFA) 108 (90 Base) MCG/ACT inhaler Inhale 2 puffs into the lungs every 6 (six) hours as needed for wheezing or shortness of breath.    [provider]  furosemide (LASIX) 20 MG tablet Take 20 mg by mouth daily as needed for fluid or edema.    [provider]  potassium citrate (UROCIT-K) 10 MEQ (1080 MG) SR tablet Take 10 mEq by mouth daily. When taking LASIX    [provider]   Allergies  Allergen Reactions   Codeine Nausea And Vomiting   Oxycodone Itching   Silver Other (See Comments)    (Tegaderm) Blisters.  Okay on hands.   Review of Systems  Unable to perform ROS: Acuity of condition   Physical  Exam Constitutional:      Appearance: She  is well-developed. She is ill-appearing.     Comments: Unable to arouse with vigorous touch and verbal stimuli  Cardiovascular:     Rate and Rhythm: Normal rate.  Pulmonary:     Breath sounds: Decreased breath sounds present.    Vital Signs: BP (!) 155/79   Pulse 94   Temp 98.9 F (37.2 C) (Oral)   Resp 16   Ht 5' 3"  (1.6 m)   Wt 97.2 kg   SpO2 98%   BMI 37.96 kg/m  Pain Scale: PAINAD   Pain Score: 0-No pain   SpO2: SpO2: 98 % O2 Device:SpO2: 98 % O2 Flow Rate: .O2 Flow Rate (L/min): 3 L/min  IO: Intake/output summary:  Intake/Output Summary (Last 24 hours) at 12/07/2020 1538 Last data filed at 12/07/2020 0433 Gross per 24 hour  Intake --  Output 860 ml  Net -860 ml    LBM: Last BM Date: 12/07/20 Baseline Weight: Weight: 99.6 kg Most recent weight: Weight: 97.2 kg     Palliative Assessment/Data:  20 % at best   Discussed with Dr Billie Ruddy and bedside RN/Matthew  Time In: 1500 Time Out: 1615 Time Total: 75 minutes Greater than 50%  of this time was spent counseling and coordinating care related to the above assessment and plan.  Signed by: Wadie Lessen, NP   Please contact Palliative Medicine Team phone at [phone] for questions and concerns.  For individual provider: See Shea Evans

## 2020-12-07 NOTE — Progress Notes (Signed)
[name] for Electrolyte Monitoring and Replacement   Recent Labs: Potassium (mmol/L)  Date Value  12/07/2020 3.4 (L)   Magnesium (mg/dL)  Date Value  12/07/2020 2.1   Calcium (mg/dL)  Date Value  12/07/2020 10.8 (H)   Albumin (g/dL)  Date Value  12/06/2020 2.7 (L)   Phosphorus (mg/dL)  Date Value  12/05/2020 3.5   Sodium (mmol/L)  Date Value  12/07/2020 150 (H)   Assessment: 75 year old female in the ICU intubated for airway protection in the setting of encephalopathy likely s/t unintentional overdose of several home medications. Pharmacy consult to assist in electrolyte replacement while in the ICU.  Goal of Therapy:  Electrolytes WNL  Plan:  --New hypernatremia, Na 150. Suspect dehydration secondary to lactulose which was increased yesterday --Corrected calcium 11.8 mg/dL. Suspect dehydration as above --K 3.4, will give Kcl 40 mEq PO x 1 dose --Will continue to monitor  Gwendolyn [name] 12/07/2020 7:51 AM

## 2020-12-07 NOTE — Plan of Care (Addendum)
Daughter/POA/Katie called to request possible transfer to [facility] and informed that patients advanced directives indictae NO artificial feedings  - only, as needed, meds through NG.  Dr. Informed.  Update: Dietician talked/educated POAs x2, on the phone, and agreed to start feedings if temporary.

## 2020-12-07 NOTE — Progress Notes (Addendum)
Brief Nutrition Support Note  Discussed initiation of TF with MD this AM, ok to start feeds. RN reports that he spoke with one of pt's POAs Veterinary surgeon) and she had indicated that she did not want artifical feeds, only meds down the tube. Called and spoke with both POAs (Katie and [place]) and explained that the TF was not meant to be a permanent measure and that it was a part of her supportive care in aiding recovery. Explained that the NGT was not a permanently placed tube and that if they wished, TF could be stopped immediately if mental status did not improve in the coming days. Discussed pt's altered labs (elevated Na and Ca) this AM likely due to dehydration which could be addressed by TF and free water flushes. Also discussed the preservation of lean body mass with adequate protein administration. Both POAs agree to a trial of TF but if pt's mental status does not improve, they both agree they do not want any heroic measures.   Also - Ivin Booty requested that GI be consulted to evaluate the state of pt's liver damage and give input on the probability of recovery. Also asked for advanced directives paperwork to be entered into chart (if not already done so). Passed along requests to ICU charge nurse and MD.   Will increase free water flushes from original planned regimen and monitor for ability to reduce. Pt has been without nutrition this admission. At moderate risk for refeeding syndrome. Pharmacy monitoring electrolytes daily and replacing as needed. Will order for phosphorus labs to be monitored in addition to the previously scheduled Mg and K for signs of refeeding.   Recommend the following TF regimen. Discussed with RN.  Recommend initiating the following TF regimen: Vital 1.5 at 14m/h (10843mtotal volume) with 1 prosource TF daily (40kcal and 11g of protein per packet) 15061mree water flush q4h Regimen will provide 1660kcal, 84g of protein, and 1725m35mee water (TF+flush)  RachRanell Patrick,  LDN Clinical Dietitian Pager on Amion

## 2020-12-07 NOTE — Progress Notes (Signed)
PT Cancellation Note  Patient Details Name: FLO BERROA MRN: [id number] DOB: 1945/06/26   Cancelled Treatment:    Reason Eval/Treat Not Completed: Other (comment). PT to sign off due to cancellation of PT orders.   Lieutenant Diego PT, DPT 3:49 PM,12/07/20

## 2020-12-07 NOTE — Progress Notes (Signed)
PROGRESS NOTE    Gwendolyn [name]  FYT:[id number] DOB: [birth year] DOA: 12/02/2020 PCP: Barbaraann Boys, MD  [id number]/[id number]   Assessment & Plan:   Active Problems:   Acute respiratory failure with hypoxia ([place])   75 y.o female with significant PMH as below who presented to the ED with chief complaints of altered mental status and near syncopal episode.   Patient is currently intubated and sedated, history obtained from patient's POA/friend who is currently at the bedside.  POA, patient recently moved to an assisted living at [place].  Patient apparently has not been taking her home medication which consist of Lexapro 20 mg, Requip, Cymbalta, Lyrica, and buprenorphine for unknown period of time.  Per POA who manages her medication, today was the first time the patient took her medications.  Patient's POA states that they were walking 30 minutes after she took her medication around 330 when she suddenly complained of feeling dizzy near syncope.   Patient was able to sit down but was soon noted to be somnolent, clammy and incoherent.  POA also stated that patient's eyes appeared to be rolling backwards but did not lose consciousness entirely.  EMS was called and patient was transported to the ED.    Given concerns for possible accidental overdose, patient was intubated for airway protection and respiratory failure.  PCCM consulted for admission to ICU.  Pt was extubated on 8/19 and transferred to hospitalist service on 8/21.   Comfort care measures --decision made on 8/23 by Gwendolyn [name], health care POA, after discussion with palliative care provider. --comfort care orders --likely refer to hospice facility tomorrow  Intubated for airway protection -Extubated 8/19 --currently on RA   Acute encephalopathy in the setting of unintentional multidrug overdose --pt remained obtunded 5 days after drug ingestion. --CT head and MRI brain, unremarkable --still obtunded after BM's with  lactulose Plan: --comfort care status now  Elevated ammonia --ammonia 50's.  Liver appeared cirrhotic.  Reported AMS seemed too sudden to be from hepatic encephalopathy, but started treatment with lactulose.  BM's achieved, but mental status did not improve. Plan: --d/c further lactulose since comfort care status now   Hx of CVA, and mild cognitive impairment  Paroxysmal Afib --d/c Lopressor 12.5 mg BID (new) --switch to liquid dilt 60 q8h  HTN --BP varied widely --d/c BP meds since comfort care status now   Anxiety and depression Chronic pain -on home Cymbalta, Lyrica, Lexapro and buprenorphine --d/c home meds since comfort care status now --IV morphine PRN instead   Restless leg syndrome -d/c Requip since comfort care status now   Hypothyroidism -free T4 wnl --d/c home Synthroid since comfort care status now   DVT prophylaxis: None:Comfort Care Code Status: DNR  Family Communication: Friend healthcare POA Gwendolyn [name] updated by palliative care provider today  Level of care: Med-Surg Dispo:   The patient is from: home Anticipated d/c is to: hospice facility Anticipated d/c date is: whenever bed Patient currently is ready for discharge.   Subjective and Interval History:  Had large BM's after lactulose, however, still obtunded, mental status not improved.  Palliative care consulted, who clarified Gwendolyn [name] as main POA and first contact for this pt.  Pt was made DNR and transitioned to comfort care, per direction of POA.   Objective: Vitals:   12/07/20 0500 12/07/20 0600 12/07/20 0646 12/07/20 1200  BP: (!) 147/85 (!) 155/79 (!) 155/79   Pulse: 84 93  94  Resp: 17 16    Temp:    98.9  F (37.2 C)  TempSrc:    Oral  SpO2: 95% 97%  98%  Weight:      Height:        Intake/Output Summary (Last 24 hours) at 12/07/2020 1730 Last data filed at 12/07/2020 1700 Gross per 24 hour  Intake 570.75 ml  Output 860 ml  Net -289.25 ml   Filed Weights   12/03/20 0429  12/04/20 0434 12/05/20 0453  Weight: 102.4 kg 98.1 kg 97.2 kg    Examination:   Constitutional: obtunded CV: No cyanosis.   RESP: normal respiratory effort, on RA Extremities: swelling in BUE's SKIN: warm, dry   Data Reviewed: I have personally reviewed following labs and imaging studies  CBC: Recent Labs  Lab 12/03/20 0429 12/04/20 0618 12/05/20 0332 12/06/20 0032 12/07/20 0546  WBC 4.9 6.4 5.2 4.1 3.6*  HGB 11.5* 11.8* 12.2 11.7* 12.2  HCT 35.1* 37.7 39.5 37.0 37.2  MCV 94.9 97.2 97.1 94.9 95.4  PLT 128* 123* 154 135* 027*   Basic Metabolic Panel: Recent Labs  Lab 12/03/20 0429 12/03/20 1620 12/04/20 0618 12/05/20 0332 12/05/20 1351 12/06/20 0032 12/07/20 0546  NA 137  --  139 142  --  145 150*  K 4.7  --  4.7 5.2* 5.0 3.9 3.4*  CL 110  --  108 111  --  114* 111  CO2 22  --  24 26  --  27 30  GLUCOSE 104*  --  118* 128*  --  120* 121*  BUN 17  --  27* 34*  --  32* 29*  CREATININE 0.95  --  0.94 0.91  --  0.82 0.78  CALCIUM 9.1  --  9.7 10.3  --  10.8* 10.8*  MG 2.0 2.1 2.1 2.4  --  2.2 2.1  PHOS 4.8* 5.2* 3.8 3.5  --   --   --    GFR: Estimated Creatinine Clearance: 70.5 mL/min (by C-G formula based on SCr of 0.78 mg/dL). Liver Function Tests: Recent Labs  Lab 12/02/20 1729 12/04/20 0618 12/05/20 0332 12/06/20 0032  AST 99* 77* 93* 73*  ALT 42 34 37 36  ALKPHOS 112 75 83 79  BILITOT 1.0 1.1 1.5* 1.1  PROT 7.4 6.1* 6.7 6.1*  ALBUMIN 3.5 2.9* 2.9* 2.7*   No results for input(s): LIPASE, AMYLASE in the last 168 hours. Recent Labs  Lab 12/03/20 0429 12/04/20 0618  AMMONIA 52* 58*   Coagulation Profile: Recent Labs  Lab 12/04/20 0857  INR 1.1   Cardiac Enzymes: No results for input(s): CKTOTAL, CKMB, CKMBINDEX, TROPONINI in the last 168 hours. BNP (last 3 results) No results for input(s): PROBNP in the last 8760 hours. HbA1C: No results for input(s): HGBA1C in the last 72 hours. CBG: Recent Labs  Lab 12/06/20 2015 12/06/20 2312  12/07/20 0340 12/07/20 0712 12/07/20 1137  GLUCAP 105* 119* 104* 106* 132*   Lipid Profile: No results for input(s): CHOL, HDL, LDLCALC, TRIG, CHOLHDL, LDLDIRECT in the last 72 hours. Thyroid Function Tests: No results for input(s): TSH, T4TOTAL, FREET4, T3FREE, THYROIDAB in the last 72 hours.  Anemia Panel: No results for input(s): VITAMINB12, FOLATE, FERRITIN, TIBC, IRON, RETICCTPCT in the last 72 hours.  Sepsis Labs: No results for input(s): PROCALCITON, LATICACIDVEN in the last 168 hours.  Recent Results (from the past 240 hour(s))  Resp Panel by RT-PCR (Flu A&B, Covid) Nasopharyngeal Swab     Status: None   Collection Time: 12/02/20  5:55 PM   Specimen: Nasopharyngeal Swab; Nasopharyngeal(NP) swabs  in vial transport medium  Result Value Ref Range Status   SARS Coronavirus 2 by RT PCR NEGATIVE NEGATIVE Final    Comment: (NOTE) SARS-CoV-2 target nucleic acids are NOT DETECTED.  The SARS-CoV-2 RNA is generally detectable in upper respiratory specimens during the acute phase of infection. The lowest concentration of SARS-CoV-2 viral copies this assay can detect is 138 copies/mL. A negative result does not preclude SARS-Cov-2 infection and should not be used as the sole basis for treatment or other patient management decisions. A negative result may occur with  improper specimen collection/handling, submission of specimen other than nasopharyngeal swab, presence of viral mutation(s) within the areas targeted by this assay, and inadequate number of viral copies(<138 copies/mL). A negative result must be combined with clinical observations, patient history, and epidemiological information. The expected result is Negative.  Fact Sheet for Patients:  EntrepreneurPulse.com.au  Fact Sheet for Healthcare Providers:  IncredibleEmployment.be  This test is no t yet approved or cleared by the [place] FDA and  has been authorized for  detection and/or diagnosis of SARS-CoV-2 by FDA under an Emergency Use Authorization (EUA). This EUA will remain  in effect (meaning this test can be used) for the duration of the COVID-19 declaration under Section 564(b)(1) of the Act, 21 U.S.C.section 360bbb-3(b)(1), unless the authorization is terminated  or revoked sooner.       Influenza A by PCR NEGATIVE NEGATIVE Final   Influenza B by PCR NEGATIVE NEGATIVE Final    Comment: (NOTE) The Xpert Xpress SARS-CoV-2/FLU/RSV plus assay is intended as an aid in the diagnosis of influenza from Nasopharyngeal swab specimens and should not be used as a sole basis for treatment. Nasal washings and aspirates are unacceptable for Xpert Xpress SARS-CoV-2/FLU/RSV testing.  Fact Sheet for Patients: EntrepreneurPulse.com.au  Fact Sheet for Healthcare Providers: IncredibleEmployment.be  This test is not yet approved or cleared by the [place] FDA and has been authorized for detection and/or diagnosis of SARS-CoV-2 by FDA under an Emergency Use Authorization (EUA). This EUA will remain in effect (meaning this test can be used) for the duration of the COVID-19 declaration under Section 564(b)(1) of the Act, 21 U.S.C. section 360bbb-3(b)(1), unless the authorization is terminated or revoked.  Performed at [facility], [address] [place], [place] [address]   MRSA Next Gen by PCR, Nasal     Status: None   Collection Time: 12/02/20  9:39 PM   Specimen: Nasal Mucosa; Nasal Swab  Result Value Ref Range Status   MRSA by PCR Next Gen NOT DETECTED NOT DETECTED Final    Comment: (NOTE) The GeneXpert MRSA Assay (FDA approved for NASAL specimens only), is one component of a comprehensive MRSA colonization surveillance program. It is not intended to diagnose MRSA infection nor to guide or monitor treatment for MRSA infections. Test performance is not FDA approved in patients less than 104  years old. Performed at [facility], [address] [place], [place] [address]       Radiology Studies: DG Abd 1 View  Result Date: 12/06/2020 CLINICAL DATA:  Nasogastric tube placement EXAM: ABDOMEN - 1 VIEW COMPARISON:  Earlier film of the same day FINDINGS: The nasogastric tube has been advanced into the stomach which is partially decompressed. Multiple gas distended nondilated small bowel loops are noted in the mid abdomen. The lower abdomen is excluded. Cholecystectomy clips. Thoracolumbar scoliosis. IMPRESSION: Nasogastric tube into decompressed stomach. Electronically Signed   By: Lucrezia Europe M.D.   On: 12/06/2020 13:41  DG Abd 1 View  Result Date: 12/06/2020 CLINICAL DATA:  NG tube placement EXAM: ABDOMEN - 1 VIEW COMPARISON:  None. FINDINGS: Nasogastric tube with the tip projecting over the distal esophagus 8 cm above the GE junction. Recommend advancing the nasogastric tube 18 cm. No bowel dilatation to suggest obstruction. No evidence of pneumoperitoneum, portal venous gas or pneumatosis. No pathologic calcifications along the expected course of the ureters. Severe dextroscoliosis of the thoracolumbar spine. IMPRESSION: Nasogastric tube with the tip projecting over the distal esophagus 8 cm above the GE junction. Recommend advancing the nasogastric tube 18 cm. Electronically Signed   By: Kathreen Devoid M.D.   On: 12/06/2020 12:36   CT HEAD WO CONTRAST (5MM)  Result Date: 12/06/2020 CLINICAL DATA:  Somnolent, unresponsive, history cirrhosis, hypertension, NASH EXAM: CT HEAD WITHOUT CONTRAST TECHNIQUE: Contiguous axial images were obtained from the base of the skull through the vertex without intravenous contrast. Sagittal and coronal MPR images reconstructed from axial data set. COMPARISON:  12/02/2020 FINDINGS: Brain: Examination mildly degraded by motion artifacts. Mild atrophy. Normal ventricular morphology. No midline shift or mass effect. Dilated perivascular space at  inferior RIGHT basal ganglia unchanged. Otherwise normal appearance of brain parenchyma. No intracranial hemorrhage, mass lesion, or evidence of acute infarction. No extra-axial fluid collections. Vascular: No hyperdense vessels. Mild atherosclerotic calcifications of vertebral arteries at skull base. Skull: Demineralized but grossly intact within limitations of motion Sinuses/Orbits: Clear Other: N/A IMPRESSION: No acute intracranial abnormalities. Electronically Signed   By: Lavonia Dana M.D.   On: 12/06/2020 12:38   MR BRAIN W WO CONTRAST  Result Date: 12/07/2020 CLINICAL DATA:  Initial evaluation for altered mental status, unclear etiology. EXAM: MRI HEAD WITHOUT AND WITH CONTRAST TECHNIQUE: Multiplanar, multiecho pulse sequences of the brain and surrounding structures were obtained without and with intravenous contrast. CONTRAST:  58m GADAVIST GADOBUTROL 1 MMOL/ML IV SOLN COMPARISON:  Comparison made with prior CT from earlier the same day as well as recent CTA from 12/02/2020. FINDINGS: Brain: Mild diffuse prominence of the CSF containing spaces compatible generalized age-related cerebral atrophy. Remote lacunar infarct present at the anterior right basal ganglia. Associated mild chronic hemosiderin staining at this location. Few additional prominent dilated perivascular spaces noted about the right greater than left basal ganglia. No abnormal foci of restricted diffusion to suggest acute or subacute ischemia or changes related to seizure. Gray-white matter differentiation otherwise maintained. No other areas of remote cortical infarction. No other evidence for acute or chronic intracranial hemorrhage. No mass lesion, midline shift or mass effect. No hydrocephalus or extra-axial fluid collection. Pituitary gland and suprasellar region normal. Midline structures intact. Symmetric increased T1 hyperintense signal intensity involving the bilateral basal ganglia, primarily involving the globus palladi, likely  related to patient history of intrinsic liver disease. No abnormal enhancement. Vascular: Major intracranial vascular flow voids are maintained. Skull and upper cervical spine: Craniocervical junction within normal limits. Bone marrow signal intensity diffusely heterogeneous without focal marrow replacing lesion. No scalp soft tissue abnormality. Sinuses/Orbits: Globes and orbital soft tissues within normal limits. Scattered mucosal thickening noted throughout the paranasal sinuses. No air-fluid levels to suggest acute sinusitis. Mastoid air cells are clear. Inner ear structures grossly normal. Other: None. IMPRESSION: 1. No acute intracranial abnormality. 2. Remote right basal ganglia lacunar infarct. 3. Symmetric increased T1 hyperintense signal intensity involving the bilateral basal ganglia, likely related to patient history of cirrhosis/intrinsic liver disease. Electronically Signed   By: BJeannine BogaM.D.   On: 12/07/2020 01:08  Scheduled Meds:  chlorhexidine gluconate (MEDLINE KIT)  15 mL Mouth Rinse BID   Chlorhexidine Gluconate Cloth  6 each Topical Q0600   mouth rinse  15 mL Mouth Rinse 10 times per day    morphine injection  2 mg Intravenous Q4H   Continuous Infusions:  sodium chloride Stopped (12/03/20 0427)     LOS: 5 days     Enzo Bi, MD [facility] If 7PM-7AM, please contact night-coverage 12/07/2020, 5:30 PM

## 2020-12-08 DIAGNOSIS — J9601 Acute respiratory failure with hypoxia: Secondary | ICD-10-CM | POA: Diagnosis not present

## 2020-12-08 DIAGNOSIS — Z66 Do not resuscitate: Secondary | ICD-10-CM | POA: Diagnosis not present

## 2020-12-08 DIAGNOSIS — Z515 Encounter for palliative care: Secondary | ICD-10-CM | POA: Diagnosis not present

## 2020-12-08 DIAGNOSIS — G9341 Metabolic encephalopathy: Secondary | ICD-10-CM | POA: Diagnosis not present

## 2020-12-08 NOTE — Progress Notes (Signed)
PROGRESS NOTE    Gwendolyn [name]  ERX:[id number] DOB: 1946/03/30 DOA: 12/02/2020 PCP: Barbaraann Boys, MD   Chief complaint.  Altered mental status. Brief Narrative:   75 y.o female with significant PMH as below who presented to the ED with chief complaints of altered mental status and near syncopal episode.   Patient is currently intubated and sedated, history obtained from patient's POA/friend who is currently at the bedside.  POA, patient recently moved to an assisted living at [facility].  Patient apparently has not been taking her home medication which consist of Lexapro 20 mg, Requip, Cymbalta, Lyrica, and buprenorphine for unknown period of time.  Per POA who manages her medication, today was the first time the patient took her medications.  Patient's POA states that they were walking 30 minutes after she took her medication around 330 when she suddenly complained of feeling dizzy near syncope.   Patient was able to sit down but was soon noted to be somnolent, clammy and incoherent.  POA also stated that patient's eyes appeared to be rolling backwards but did not lose consciousness entirely.  EMS was called and patient was transported to the ED.     Given concerns for possible accidental overdose, patient was intubated for airway protection and respiratory failure.  PCCM consulted for admission to ICU.  Pt was extubated on 8/19 and transferred to hospitalist service on 8/21.  Assessment & Plan:   Active Problems:   Acute respiratory failure with hypoxia (HCC)  #1.  Acute hypoxemic respiratory failure Acute metabolic encephalopathy. Patient was requiring intubation for airway protection. Currently respiratory status has improved. But the patient is still minimally responsive.  #2.  Paroxysmal atrial fibrillation.   History of a CVA. Mild dementia.  #3 essential hypertension.  4.  Morbid obesity.  Discussed with palliative care, patient has transition to comfort care  only.  We will transfer to hospice when bed is available.    Subjective: Patient opening eyes only.  she seemed to be comfortable. She does not have any short of breath or cough. No fever or chills. No abdominal pain or nausea vomiting  Objective: Vitals:   12/08/20 0300 12/08/20 0500 12/08/20 0600 12/08/20 0700  BP:   (!) 142/79   Pulse: (!) 59 74 86 60  Resp: 14 13 13 11   Temp:      TempSrc:      SpO2: 95% 96% 97% 94%  Weight:      Height:        Intake/Output Summary (Last 24 hours) at 12/08/2020 1507 Last data filed at 12/07/2020 1700 Gross per 24 hour  Intake 570.75 ml  Output --  Net 570.75 ml   Filed Weights   12/03/20 0429 12/04/20 0434 12/05/20 0453  Weight: 102.4 kg 98.1 kg 97.2 kg    Examination:  General exam: Appears calm and comfortable  Respiratory system: Clear to auscultation. Respiratory effort normal. Cardiovascular system: S1 & S2 heard, RRR. No JVD, murmurs, rubs, gallops or clicks. No pedal edema. Gastrointestinal system: Abdomen is nondistended, soft and nontender. No organomegaly or masses felt. Normal bowel sounds heard. Central nervous system: And oriented x1.. No focal neurological deficits. Extremities: Symmetric 5 x 5 power. Skin: No rashes, lesions or ulcers     Data Reviewed: I have personally reviewed following labs and imaging studies  CBC: Recent Labs  Lab 12/03/20 0429 12/04/20 0618 12/05/20 0332 12/06/20 0032 12/07/20 0546  WBC 4.9 6.4 5.2 4.1 3.6*  HGB 11.5* 11.8* 12.2  11.7* 12.2  HCT 35.1* 37.7 39.5 37.0 37.2  MCV 94.9 97.2 97.1 94.9 95.4  PLT 128* 123* 154 135* 037*   Basic Metabolic Panel: Recent Labs  Lab 12/03/20 0429 12/03/20 1620 12/04/20 0618 12/05/20 0332 12/05/20 1351 12/06/20 0032 12/07/20 0546  NA 137  --  139 142  --  145 150*  K 4.7  --  4.7 5.2* 5.0 3.9 3.4*  CL 110  --  108 111  --  114* 111  CO2 22  --  24 26  --  27 30  GLUCOSE 104*  --  118* 128*  --  120* 121*  BUN 17  --  27* 34*  --   32* 29*  CREATININE 0.95  --  0.94 0.91  --  0.82 0.78  CALCIUM 9.1  --  9.7 10.3  --  10.8* 10.8*  MG 2.0 2.1 2.1 2.4  --  2.2 2.1  PHOS 4.8* 5.2* 3.8 3.5  --   --   --    GFR: Estimated Creatinine Clearance: 70.5 mL/min (by C-G formula based on SCr of 0.78 mg/dL). Liver Function Tests: Recent Labs  Lab 12/02/20 1729 12/04/20 0618 12/05/20 0332 12/06/20 0032  AST 99* 77* 93* 73*  ALT 42 34 37 36  ALKPHOS 112 75 83 79  BILITOT 1.0 1.1 1.5* 1.1  PROT 7.4 6.1* 6.7 6.1*  ALBUMIN 3.5 2.9* 2.9* 2.7*   No results for input(s): LIPASE, AMYLASE in the last 168 hours. Recent Labs  Lab 12/03/20 0429 12/04/20 0618  AMMONIA 52* 58*   Coagulation Profile: Recent Labs  Lab 12/04/20 0857  INR 1.1   Cardiac Enzymes: No results for input(s): CKTOTAL, CKMB, CKMBINDEX, TROPONINI in the last 168 hours. BNP (last 3 results) No results for input(s): PROBNP in the last 8760 hours. HbA1C: No results for input(s): HGBA1C in the last 72 hours. CBG: Recent Labs  Lab 12/06/20 2015 12/06/20 2312 12/07/20 0340 12/07/20 0712 12/07/20 1137  GLUCAP 105* 119* 104* 106* 132*   Lipid Profile: No results for input(s): CHOL, HDL, LDLCALC, TRIG, CHOLHDL, LDLDIRECT in the last 72 hours. Thyroid Function Tests: No results for input(s): TSH, T4TOTAL, FREET4, T3FREE, THYROIDAB in the last 72 hours. Anemia Panel: No results for input(s): VITAMINB12, FOLATE, FERRITIN, TIBC, IRON, RETICCTPCT in the last 72 hours. Sepsis Labs: No results for input(s): PROCALCITON, LATICACIDVEN in the last 168 hours.  Recent Results (from the past 240 hour(s))  Resp Panel by RT-PCR (Flu A&B, Covid) Nasopharyngeal Swab     Status: None   Collection Time: 12/02/20  5:55 PM   Specimen: Nasopharyngeal Swab; Nasopharyngeal(NP) swabs in vial transport medium  Result Value Ref Range Status   SARS Coronavirus 2 by RT PCR NEGATIVE NEGATIVE Final    Comment: (NOTE) SARS-CoV-2 target nucleic acids are NOT DETECTED.  The  SARS-CoV-2 RNA is generally detectable in upper respiratory specimens during the acute phase of infection. The lowest concentration of SARS-CoV-2 viral copies this assay can detect is 138 copies/mL. A negative result does not preclude SARS-Cov-2 infection and should not be used as the sole basis for treatment or other patient management decisions. A negative result may occur with  improper specimen collection/handling, submission of specimen other than nasopharyngeal swab, presence of viral mutation(s) within the areas targeted by this assay, and inadequate number of viral copies(<138 copies/mL). A negative result must be combined with clinical observations, patient history, and epidemiological information. The expected result is Negative.  Fact Sheet for Patients:  EntrepreneurPulse.com.au  Fact Sheet for Healthcare Providers:  IncredibleEmployment.be  This test is no t yet approved or cleared by the [place] FDA and  has been authorized for detection and/or diagnosis of SARS-CoV-2 by FDA under an Emergency Use Authorization (EUA). This EUA will remain  in effect (meaning this test can be used) for the duration of the COVID-19 declaration under Section 564(b)(1) of the Act, 21 U.S.C.section 360bbb-3(b)(1), unless the authorization is terminated  or revoked sooner.       Influenza A by PCR NEGATIVE NEGATIVE Final   Influenza B by PCR NEGATIVE NEGATIVE Final    Comment: (NOTE) The Xpert Xpress SARS-CoV-2/FLU/RSV plus assay is intended as an aid in the diagnosis of influenza from Nasopharyngeal swab specimens and should not be used as a sole basis for treatment. Nasal washings and aspirates are unacceptable for Xpert Xpress SARS-CoV-2/FLU/RSV testing.  Fact Sheet for Patients: EntrepreneurPulse.com.au  Fact Sheet for Healthcare Providers: IncredibleEmployment.be  This test is not yet approved or  cleared by the [place] FDA and has been authorized for detection and/or diagnosis of SARS-CoV-2 by FDA under an Emergency Use Authorization (EUA). This EUA will remain in effect (meaning this test can be used) for the duration of the COVID-19 declaration under Section 564(b)(1) of the Act, 21 U.S.C. section 360bbb-3(b)(1), unless the authorization is terminated or revoked.  Performed at [facility], [address] [place], [place] [address]   MRSA Next Gen by PCR, Nasal     Status: None   Collection Time: 12/02/20  9:39 PM   Specimen: Nasal Mucosa; Nasal Swab  Result Value Ref Range Status   MRSA by PCR Next Gen NOT DETECTED NOT DETECTED Final    Comment: (NOTE) The GeneXpert MRSA Assay (FDA approved for NASAL specimens only), is one component of a comprehensive MRSA colonization surveillance program. It is not intended to diagnose MRSA infection nor to guide or monitor treatment for MRSA infections. Test performance is not FDA approved in patients less than 71 years old. Performed at [facility], [address], [place], [place] [address]          Radiology Studies: MR BRAIN W WO CONTRAST  Result Date: 12/07/2020 CLINICAL DATA:  Initial evaluation for altered mental status, unclear etiology. EXAM: MRI HEAD WITHOUT AND WITH CONTRAST TECHNIQUE: Multiplanar, multiecho pulse sequences of the brain and surrounding structures were obtained without and with intravenous contrast. CONTRAST:  55m GADAVIST GADOBUTROL 1 MMOL/ML IV SOLN COMPARISON:  Comparison made with prior CT from earlier the same day as well as recent CTA from 12/02/2020. FINDINGS: Brain: Mild diffuse prominence of the CSF containing spaces compatible generalized age-related cerebral atrophy. Remote lacunar infarct present at the anterior right basal ganglia. Associated mild chronic hemosiderin staining at this location. Few additional prominent dilated perivascular spaces noted about the right  greater than left basal ganglia. No abnormal foci of restricted diffusion to suggest acute or subacute ischemia or changes related to seizure. Gray-white matter differentiation otherwise maintained. No other areas of remote cortical infarction. No other evidence for acute or chronic intracranial hemorrhage. No mass lesion, midline shift or mass effect. No hydrocephalus or extra-axial fluid collection. Pituitary gland and suprasellar region normal. Midline structures intact. Symmetric increased T1 hyperintense signal intensity involving the bilateral basal ganglia, primarily involving the globus palladi, likely related to patient history of intrinsic liver disease. No abnormal enhancement. Vascular: Major intracranial vascular flow voids are maintained. Skull and upper cervical spine: Craniocervical junction within normal limits. Bone marrow signal  intensity diffusely heterogeneous without focal marrow replacing lesion. No scalp soft tissue abnormality. Sinuses/Orbits: Globes and orbital soft tissues within normal limits. Scattered mucosal thickening noted throughout the paranasal sinuses. No air-fluid levels to suggest acute sinusitis. Mastoid air cells are clear. Inner ear structures grossly normal. Other: None. IMPRESSION: 1. No acute intracranial abnormality. 2. Remote right basal ganglia lacunar infarct. 3. Symmetric increased T1 hyperintense signal intensity involving the bilateral basal ganglia, likely related to patient history of cirrhosis/intrinsic liver disease. Electronically Signed   By: Jeannine Boga M.D.   On: 12/07/2020 01:08        Scheduled Meds:  chlorhexidine gluconate (MEDLINE KIT)  15 mL Mouth Rinse BID   Chlorhexidine Gluconate Cloth  6 each Topical Q0600   mouth rinse  15 mL Mouth Rinse 10 times per day    morphine injection  2 mg Intravenous Q4H   Continuous Infusions:  sodium chloride Stopped (12/03/20 0427)     LOS: 6 days    Time spent: 23 minutes    Sharen Hones, MD [facility]   To contact the attending provider between 7A-7P or the covering provider during after hours 7P-7A, please log into the web site [email or web address] and access using universal [place] password for that web site. If you do not have the password, please call the hospital operator.  12/08/2020, 3:07 PM

## 2020-12-08 NOTE — Progress Notes (Signed)
Received request from [facility], [place] for family interest in hospice home. Chart reviewed and hospice home eligibility is pending at this time. Visited with patient at bedside and spoke with POA Ivin Booty to acknowledge referral and explain services. Unfortunately hospice home does not have a bed to offer today. TOC is aware hospital liaison will follow up tomorrow or sooner if room becomes available.   Please do not hesitate to call with any hospice related questions or concerns.   Thank you for the opportunity to participate in this patient's care.  Jhonnie Garner, Therapist, sports, [facility] Liaison  [phone]

## 2020-12-08 NOTE — Progress Notes (Signed)
Patient ID: [name], female   DOB: 28-Sep-1945, 75 y.o.   MRN: [id number]    Progress Note from the Palliative Medicine Team at [facility]   Patient Name: Gwendolyn [name]        Date: 12/08/2020 DOB: Oct 08, 1945  Age: 75 y.o. MRN#: [id number] Attending Physician: Sharen Hones, MD Primary Care Physician: Barbaraann Boys, MD Admit Date: 12/02/2020   Medical records reviewed   75 y.o female with significant PMH as below who presented to the ED with chief complaints of altered mental status and near syncopal episode, accidental overdose in question.  Past medical history significant for GERD, hypertension, breast cancer, nonalcoholic liver cirrhosis, anxiety and depression, asthma, chronic pain, hyperlipidemia, hypothyroidism, history of CVA, anxiety and depression.    She was admitted through the emergency room for treatment and stabilization and observation.  Intubated and admitted to ICU, successfully extubated but never neurologically rebounded, remains obtunded within context of full medical support.   Patient today appears to be resting comfortably, she is unresponsive to vigorous touch and verbal stimulation.  Yesterday conversation was had with patient's clearly documented  H POA [employer] and decision was made to shift to full comfort path, focusing on comfort and dignity allowing for natural death.   I spoke to [facility]  by telephone this morning.  Ivin Booty again verbalizes comfort with decision to shift to comfort, allowing a natural death,  knowing that patient, [name],  was clear and adamant about her advance care planning wishes.  "She never wanted life-prolonging measures".  Ivin Booty speaks to patient's readiness and acceptance of her death.    Plan of Care: -DNR/DNI -No artificial feeding or hydration now or in the future -No further diagnostics or life prolonging measures -Symptom management for end-of-life care    -Morphine 2 mg IV every 4 hours  scheduled for underlying chronic pain    -Morphine 1 mg IV every 1 hour as needed/breakthrough pain/dyspnea -Referral to residential hospice for end-of-life care    (Prognosis is likely less than two weeks; unresponsive, no oral intake,  no life prolonging interventions)   Questions and concerns addressed   Discussed with Dr Roosevelt Locks   Total time spent on the unit was 35 minutes  Greater than 50% of the time was spent in counseling and coordination of care  Wadie Lessen NP  Palliative Medicine Team Team Phone # [phone] Pager [phone]

## 2020-12-08 NOTE — Progress Notes (Signed)
Nutrition Brief Note  Chart reviewed. Pt now transitioning to comfort care.  No further nutrition interventions planned at this time.  Please re-consult as needed.   Ranell Patrick, RD, LDN Clinical Dietitian Pager on [place]

## 2020-12-09 ENCOUNTER — Inpatient Hospital Stay: Payer: Medicare Other

## 2020-12-09 ENCOUNTER — Encounter: Payer: Self-pay | Admitting: Internal Medicine

## 2020-12-09 DIAGNOSIS — J9601 Acute respiratory failure with hypoxia: Secondary | ICD-10-CM | POA: Diagnosis not present

## 2020-12-09 DIAGNOSIS — R221 Localized swelling, mass and lump, neck: Secondary | ICD-10-CM

## 2020-12-09 DIAGNOSIS — G9341 Metabolic encephalopathy: Secondary | ICD-10-CM | POA: Diagnosis not present

## 2020-12-09 DIAGNOSIS — Z515 Encounter for palliative care: Secondary | ICD-10-CM | POA: Diagnosis not present

## 2020-12-09 MED ORDER — MORPHINE SULFATE (PF) 2 MG/ML IV SOLN: 1.0000 mg | INTRAVENOUS | Status: DC | PRN

## 2020-12-09 MED ORDER — METHYLPREDNISOLONE SODIUM SUCC 40 MG IJ SOLR
40.0000 mg | Freq: Every day | INTRAMUSCULAR | Status: DC
  Administered 2020-12-09 – 2020-12-11 (×3): 40 mg via INTRAVENOUS
  Filled 2020-12-09 (×3): qty 1

## 2020-12-09 MED ORDER — MORPHINE SULFATE (PF) 2 MG/ML IV SOLN: 1.0000 mg | INTRAVENOUS | 0 refills | Status: DC | PRN

## 2020-12-09 MED ORDER — IOHEXOL 350 MG/ML SOLN
75.0000 mL | Freq: Once | INTRAVENOUS | Status: AC | PRN
  Administered 2020-12-09: 75 mL via INTRAVENOUS

## 2020-12-09 MED ORDER — LACTATED RINGERS IV SOLN: INTRAVENOUS | Status: DC

## 2020-12-09 MED ORDER — SODIUM CHLORIDE 0.9 % IV SOLN
3.0000 g | Freq: Three times a day (TID) | INTRAVENOUS | Status: DC
  Administered 2020-12-09 – 2020-12-11 (×6): 3 g via INTRAVENOUS
  Filled 2020-12-09 (×3): qty 3
  Filled 2020-12-09 (×3): qty 8
  Filled 2020-12-09 (×3): qty 3

## 2020-12-09 NOTE — Progress Notes (Addendum)
[facility]) Hospital Liaison RN Note  Hospice Home eligibility had been confirmed by [facility] physician. Patient was to transport to [facility] today. Approximately 2 hours prior to transport time patient's status changed.   Patient is now awake and drinking PO fluids. She has an edematous area to the left side of her neck. Due to this change, spoke with legal guardian Tawni Millers, Dr. Roosevelt Locks and Wadie Lessen, NP. The plan is now for the patient to have evaluation of neck and monitor for increased improvement. [facility] will follow patient through disposition and reassess if she begins to decline again. Transport to [facility] has been discontinued at this time.   Please do not hesitate to call with any hospice related questions.   Thank you for the opportunity to participate in this patient's care.  Bobbie "Loren Racer, RN, BSN [facility] Liaison [phone]

## 2020-12-09 NOTE — Care Management Important Message (Signed)
Important Message  Patient Details  Name: Gwendolyn [name] MRN: [id number] Date of Birth: March 28, 1946   Medicare Important Message Given:  Other (see comment)  Patient is comfort care with plan to transfer to the [facility] when a bed is available.  Out of respect for the patient and family no Important Message from [facility] given.   Juliann Pulse A Caiden Arteaga 12/09/2020, 8:53 AM

## 2020-12-09 NOTE — Progress Notes (Signed)
SLP Cancellation Note  Patient Details Name: Gwendolyn [name] MRN: [id number] DOB: 1945-07-27   Cancelled treatment:       Reason Eval/Treat Not Completed:  (chart reviewed; Imaging still pending.). Per chart review, pt was admitted w/ AMS, orally intubated and now extubated, improved Neurologically, but now has a large, hard, tender mass on the left side of her neck per MD assessment. Imaging still pending this PM.  She currently presents w/ drowsiness and requires Mod++ verbal cues for follow through w/ tasks but has been sipping thin liquids w/ Family assisting w/ overt decline of status.  To be conservative in this situation, recommend modifying the current mech soft diet(per MD order) to a Pureed consistency diet w/ thin liquids until ST can perform BSE and provide pt/family education. Recommend general aspiration precautions and pills in Puree as well. Discussed the above w/ NSG who agreed based on pt's current presentation this PM. ST services will f/u in the morning.     Orinda Kenner, MS, CCC-SLP Speech Language Pathologist Rehab Services [phone] [facility] 12/09/2020, 3:53 PM

## 2020-12-09 NOTE — Progress Notes (Addendum)
Spoke with patient POA, patient appears to be more awake today, she was able to speak a few words.  As result, POA want to cancel hospice transfer.  Looking to patient can be treated or not.  Also patient had a significant left neck swelling, she could have abscess.  Will obtain CT scan of the neck. As for her altered mental status, I will repeat a CT head. Also obtain speech therapy evaluation to start a diet.  1635. Reviewed CT scan results. Neck CT showed enlarged submandibular gland, no evidence of abscess. CT head no acute changes. Discussed with Dr. Pryor Ochoa, CT finding is consistent with sialoadenitis, recommended IVF, antibiotics and steroids.

## 2020-12-09 NOTE — Discharge Summary (Signed)
Physician Discharge Summary  Patient ID: Gwendolyn [name] MRN: [id number] DOB/AGE: April 10, 1946 75 y.o.  Admit date: 12/02/2020 Discharge date: 12/09/2020  Admission Diagnoses:  Discharge Diagnoses:  Active Problems:   Acute metabolic encephalopathy   Acute respiratory failure with hypoxia [facility])   Discharged Condition: poor  Hospital Course:  75 y.o female with significant PMH as below who presented to the ED with chief complaints of altered mental status and near syncopal episode.   Patient is currently intubated and sedated, history obtained from patient's POA/friend who is currently at the bedside.  POA, patient recently moved to an assisted living at [facility].  Patient apparently has not been taking her home medication which consist of Lexapro 20 mg, Requip, Cymbalta, Lyrica, and buprenorphine for unknown period of time.  Per POA who manages her medication, today was the first time the patient took her medications.  Patient's POA states that they were walking 30 minutes after she took her medication around 330 when she suddenly complained of feeling dizzy near syncope.   Patient was able to sit down but was soon noted to be somnolent, clammy and incoherent.  POA also stated that patient's eyes appeared to be rolling backwards but did not lose consciousness entirely.  EMS was called and patient was transported to the ED.     Given concerns for possible accidental overdose, patient was intubated for airway protection and respiratory failure.  PCCM consulted for admission to ICU.  Pt was extubated on 8/19 and transferred to hospitalist service on 8/21.  #1.  Acute hypoxemic respiratory failure Acute metabolic encephalopathy. Patient was requiring intubation for airway protection. Currently respiratory status has improved. But the patient is still minimally responsive. Patient has been followed by palliative care, after discussion with the family, transitioned to comfort care  since yesterday.  Patient has been accepted into inpatient hospice today.   #2.  Paroxysmal atrial fibrillation.   History of a CVA. Mild dementia.   #3 essential hypertension.  4.  Morbid obesity.  Consults: None  Significant Diagnostic Studies:   Treatments: comfort care  Discharge Exam: Blood pressure (!) 149/76, pulse 65, temperature 98.1 F (36.7 C), resp. rate 14, height 5' 3"  (1.6 m), weight 97.2 kg, SpO2 95 %. General appearance: Open eyes only Resp: clear to auscultation bilaterally Cardio: regular rate and rhythm, S1, S2 normal, no murmur, click, rub or gallop GI: soft, non-tender; bowel sounds normal; no masses,  no organomegaly Extremities: extremities normal, atraumatic, no cyanosis or edema  Disposition: Discharge disposition: 51-Hospice/Medical Facility       Discharge Instructions     Diet - low sodium heart healthy   Complete by: As directed    Increase activity slowly   Complete by: As directed       Allergies as of 12/09/2020       Reactions   Codeine Nausea And Vomiting   Oxycodone Itching   Silver Other (See Comments)   (Tegaderm) Blisters.  Okay on hands.        Medication List     STOP taking these medications    albuterol 108 (90 Base) MCG/ACT inhaler Commonly known as: VENTOLIN HFA   buprenorphine 2 MG Subl SL tablet Commonly known as: SUBUTEX   calcium carbonate 1250 (500 Ca) MG tablet Commonly known as: OS-CAL - dosed in mg of elemental calcium   DULoxetine 20 MG capsule Commonly known as: CYMBALTA   enalapril 10 MG tablet Commonly known as: VASOTEC   escitalopram 20 MG  tablet Commonly known as: LEXAPRO   furosemide 20 MG tablet Commonly known as: LASIX   levothyroxine 50 MCG tablet Commonly known as: SYNTHROID   omeprazole 20 MG capsule Commonly known as: PRILOSEC   potassium citrate 10 MEQ (1080 MG) SR tablet Commonly known as: UROCIT-K   pregabalin 50 MG capsule Commonly known as: LYRICA    rOPINIRole 2 MG tablet Commonly known as: REQUIP   Vitamin D 50 MCG (2000 UT) tablet       TAKE these medications    acetaminophen 500 MG tablet Commonly known as: TYLENOL Take 1,000 mg by mouth every 6 (six) hours as needed for moderate pain or headache.   morphine 2 MG/ML injection Inject 0.5 mLs (1 mg total) into the vein every hour as needed (breakthru pain or dyspnea).         Signed: Sharen Hones 12/09/2020, 11:39 AM

## 2020-12-09 NOTE — Progress Notes (Signed)
Patient ID: Gwendolyn [name], female   DOB: Jan 21, 1946, 75 y.o.   MRN: [id number]    Progress Note from the Palliative Medicine Team at [facility]   Patient Name: Gwendolyn [name]        Date: 12/09/2020 DOB: Dec 20, 1945  Age: 75 y.o. MRN#: [id number] Attending Physician: Sharen Hones, MD Primary Care Physician: Barbaraann Boys, MD Admit Date: 12/02/2020   Medical records reviewed   75 y.o female with significant PMH as below who presented to the ED with chief complaints of altered mental status and near syncopal episode, accidental overdose in question.  Past medical history significant for GERD, hypertension, breast cancer, nonalcoholic liver cirrhosis, anxiety and depression, asthma, chronic pain, hyperlipidemia, hypothyroidism, history of CVA, anxiety and depression.    She was admitted through the emergency room for treatment and stabilization and observation.  Intubated and admitted to ICU, successfully extubated but never neurologically rebounded, remains obtunded within context of full medical support.   Patient's H POA Tawni Millers had made decision to forego life prolonging measures focus on comfort and dignity and allow natural death.   Plan was residential hospice for end-of-life care however today when the surprisingly is arousing.   Her speech is garbled and she is not following commands but her support person/Katie offered her drink and she has consumed 2 full cups of water without difficulty.  On assessment I noted a large,  hard, tender mass on the left side of her neck.  Discussed with Dr. Roosevelt Locks, with consent from [facility] he will proceed with further evaluation and treatment.  I spoke with Tawni Millers again today and she is happy to hear that Shamyia seems to be waking.   She is in agreement with further work-up, and treatment.   She hopes for continued improvement.  Education offered on the importance of consideration of anticipatory care needs.  This patient was living in an  independent facility prior to this admission and  trajectory of this hospital stay is unclear.   Plan of Care: -DNR/DNI -No artificial feeding or hydration now or in the future -Morphine 1 mg IV every 3 hour as needed for  pain/dyspnea -Cancel residential hospice for end-of-life care -continue work-up for right neck mass -H POA will make decisions day by day depending on patient outcomes ultimately comfort and dignity are priority and care      Questions and concerns addressed   Discussed with Dr Roosevelt Locks   Total time spent on the unit was 35 minutes  Greater than 50% of the time was spent in counseling and coordination of care  Wadie Lessen NP  Palliative Medicine Team Team Phone # [phone] Pager [phone]

## 2020-12-09 NOTE — TOC Progression Note (Signed)
Transition of Care [facility]) - Progression Note    Patient Details  Name: Gwendolyn [name] MRN: [id number] Date of Birth: [birth year]  Transition of Care [facility]) CM/SW [place], RN Phone Number: 12/09/2020, 11:29 AM  Clinical Narrative:   The patient will be going to the [place] facility today around 1 PM transported with [place] paperwork on the chart for DC, POA aware    Expected Discharge Plan: Assisted Living Barriers to Discharge: Continued Medical Work up  Expected Discharge Plan and Services Expected Discharge Plan: Assisted Living       Living arrangements for the past 2 months: Assisted Living Facility                                       Social Determinants of Health (SDOH) Interventions    Readmission Risk Interventions Readmission Risk Prevention Plan 12/04/2020  Post Dischage Appt Complete  Medication Screening Complete  Transportation Screening Complete  Some recent data might be hidden

## 2020-12-09 NOTE — TOC Progression Note (Signed)
Transition of Care [facility]) - Progression Note    Patient Details  Name: Gwendolyn [name] MRN: [id number] Date of Birth: 1945/10/06  Transition of Care [facility]) CM/SW [place], RN Phone Number: 12/09/2020, 12:15 PM  Clinical Narrative:    Transportation has been cancelled and a place on her neck will be evaluated, will continue to monitor for changes and needs   Expected Discharge Plan: Assisted Living Barriers to Discharge: Continued Medical Work up  Expected Discharge Plan and Services Expected Discharge Plan: Assisted Living       Living arrangements for the past 2 months: [place] Expected Discharge Date: 12/09/20                                     Social Determinants of Health (SDOH) Interventions    Readmission Risk Interventions Readmission Risk Prevention Plan 12/04/2020  Post Dischage Appt Complete  Medication Screening Complete  Transportation Screening Complete  Some recent data might be hidden

## 2020-12-10 DIAGNOSIS — D696 Thrombocytopenia, unspecified: Secondary | ICD-10-CM

## 2020-12-10 DIAGNOSIS — G9341 Metabolic encephalopathy: Secondary | ICD-10-CM | POA: Diagnosis not present

## 2020-12-10 DIAGNOSIS — E870 Hyperosmolality and hypernatremia: Secondary | ICD-10-CM

## 2020-12-10 DIAGNOSIS — J9601 Acute respiratory failure with hypoxia: Secondary | ICD-10-CM | POA: Diagnosis not present

## 2020-12-10 LAB — CBC WITH DIFFERENTIAL/PLATELET
Abs Immature Granulocytes: 0.05 10*3/uL (ref 0.00–0.07)
Basophils Absolute: 0 10*3/uL (ref 0.0–0.1)
Basophils Relative: 0 %
Eosinophils Absolute: 0 10*3/uL (ref 0.0–0.5)
Eosinophils Relative: 0 %
HCT: 39.3 % (ref 36.0–46.0)
Hemoglobin: 12.4 g/dL (ref 12.0–15.0)
Immature Granulocytes: 1 %
Lymphocytes Relative: 13 %
Lymphs Abs: 0.9 10*3/uL (ref 0.7–4.0)
MCH: 30.6 pg (ref 26.0–34.0)
MCHC: 31.6 g/dL (ref 30.0–36.0)
MCV: 97 fL (ref 80.0–100.0)
Monocytes Absolute: 0.6 10*3/uL (ref 0.1–1.0)
Monocytes Relative: 9 %
Neutro Abs: 5.3 10*3/uL (ref 1.7–7.7)
Neutrophils Relative %: 77 %
Platelets: 105 10*3/uL — ABNORMAL LOW (ref 150–400)
RBC: 4.05 MIL/uL (ref 3.87–5.11)
RDW: 15.9 % — ABNORMAL HIGH (ref 11.5–15.5)
WBC: 6.9 10*3/uL (ref 4.0–10.5)
nRBC: 0 % (ref 0.0–0.2)

## 2020-12-10 LAB — MAGNESIUM: Magnesium: 2.1 mg/dL (ref 1.7–2.4)

## 2020-12-10 LAB — BASIC METABOLIC PANEL
Anion gap: 7 (ref 5–15)
BUN: 25 mg/dL — ABNORMAL HIGH (ref 8–23)
CO2: 30 mmol/L (ref 22–32)
Calcium: 9.7 mg/dL (ref 8.9–10.3)
Chloride: 110 mmol/L (ref 98–111)
Creatinine, Ser: 0.75 mg/dL (ref 0.44–1.00)
GFR, Estimated: >60 mL/min (ref >60)
Glucose, Bld: 97 mg/dL (ref 70–99)
Potassium: 3.5 mmol/L (ref 3.5–5.1)
Sodium: 147 mmol/L — ABNORMAL HIGH (ref 135–145)

## 2020-12-10 LAB — PHOSPHORUS: Phosphorus: 3.2 mg/dL (ref 2.5–4.6)

## 2020-12-10 MED ORDER — MAGIC MOUTHWASH
10.0000 mL | Freq: Three times a day (TID) | ORAL | Status: DC | PRN
  Filled 2020-12-10: qty 10

## 2020-12-10 MED ORDER — LEVOTHYROXINE SODIUM 50 MCG PO TABS
75.0000 ug | ORAL_TABLET | Freq: Every day | ORAL | Status: DC
  Administered 2020-12-11 – 2020-12-15 (×5): 75 ug via ORAL
  Filled 2020-12-10 (×5): qty 1

## 2020-12-10 MED ORDER — PANTOPRAZOLE SODIUM 40 MG PO TBEC
40.0000 mg | DELAYED_RELEASE_TABLET | Freq: Every day | ORAL | Status: DC
  Administered 2020-12-11 – 2020-12-15 (×5): 40 mg via ORAL
  Filled 2020-12-10 (×5): qty 1

## 2020-12-10 MED ORDER — HYDROCODONE-ACETAMINOPHEN 5-325 MG PO TABS
1.0000 | ORAL_TABLET | Freq: Four times a day (QID) | ORAL | Status: DC | PRN
  Administered 2020-12-10: 1 via ORAL
  Filled 2020-12-10: qty 1

## 2020-12-10 MED ORDER — ENOXAPARIN SODIUM 60 MG/0.6ML IJ SOSY
50.0000 mg | PREFILLED_SYRINGE | INTRAMUSCULAR | Status: DC
  Administered 2020-12-10 – 2020-12-14 (×5): 50 mg via SUBCUTANEOUS
  Filled 2020-12-10 (×5): qty 0.6

## 2020-12-10 NOTE — Progress Notes (Signed)
Patient slept on and off throughout the night. Responding to her name. She could not verbalize her date of birth and was not aware of her situation. She was able to say that she was [facility] when asked her location.

## 2020-12-10 NOTE — Final Consult Note (Signed)
Gwendolyn [name], Gwendolyn [name] [id number] Sep 16, 1945 Sharen Hones, MD  Reason for Consult: Left neck swelling  HPI: 75 y.o. female with AMS admitted for acute respiratory failure.  Patient was set to be discharged to hospice when her status changed.  She was noted to have left sided neck swelling and CT scan showed inflammation of the left submandibular gland.  Patient denies pain but is a poor historian and answers to most questions with "ok".  Allergies:  Allergies  Allergen Reactions   Codeine Nausea And Vomiting   Oxycodone Itching   Silver Other (See Comments)    (Tegaderm) Blisters.  Okay on hands.    ROS: Review of systems normal other than 12 systems except per HPI.  PMH:  Past Medical History:  Diagnosis Date   Anxiety    Arthritis    Asthma    Breast cancer ([place]) 01/2010   left mastectomy, LN neg, on femara   Chronic low back pain    Cirrhosis of liver (HCC)    Depression    Fibromyalgia    GERD (gastroesophageal reflux disease)    History of chicken pox    HTN (hypertension)    Hyperlipidemia    Hypothyroidism    IBS (irritable bowel syndrome)    Migraines    NASH (nonalcoholic steatohepatitis)    Followed by Dr. 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      Regular Exercise -  Yes, water aerobics 3 times a week   Daily Caffeine Use:  NO            Social Determinants of Health   Financial Resource Strain: Not on file  Food Insecurity: Not on file  Transportation Needs: Not on file  Physical Activity: Not on file  Stress: Not on file  Social Connections: Not on file  Intimate Partner Violence: Not on file    PSH:  Past Surgical History:  Procedure Laterality Date   ABDOMINAL HYSTERECTOMY     ANKLE SURGERY  2006   [place] BIOPSY  2011   [place]   CHOLECYSTECTOMY  2005   [place]     GASTRIC BYPASS  2008   KNEE ARTHROSCOPY Left 09/17/2018   Procedure: KNEE ARTHROSCOPY WITH DEBRIDEMENT, PARTIAL MEDIAL MENISCECTOMY AND SUB-CHONDROPLASTY OF A BONE MARROW  LESION INVOLVING THE MEIDAL TIBIAL PLATEAU;  Surgeon: Corky Mull, MD;  Location: [facility] ORS;  Service: Orthopedics;  Laterality: Left;   MASTECTOMY  2011   Breast Cancer   RECTOCELE REPAIR     ROTATOR CUFF REPAIR  2001   SEPTOPLASTY     TONSILLECTOMY  age 68   TOTAL ABDOMINAL HYSTERECTOMY W/ BILATERAL SALPINGOOPHORECTOMY  1991   VAGINAL DELIVERY     x3    Physical  Exam:  GEN-  supine in bed in NAD NEURO- CN 2-12 grossly intact and symmetric., patient is confused and not oriented to situation EARS- external ears clear NOSE- clear OC/OP- dry mucosa with cracking of tongue and crusting throughout, no flow of saliva from submandibular ducts.  No floor of mouth swelling. NECK-  left submandibular gland enlargement with tenderness RESP- unlabored   CT-  Left submandibular gland sialoadenitis with no abscess or stone  A/P: Left submandibular sialoadenitis secondary to dry mouth  Plan:  Recommend hydration and oral fluids depending on  clearance from speech therapy.  Recommend oral swab sticks and if cleared by speech sialogogues such as lemon wedges.  Agree with antibiotics and steroids.  Slightly improved from yesterdays examination.  If possible, have patient massage gland but patient was not able to follow instructions this morning regarding this.  No surgical intervention needed at this time.   Jeannie Fend Abhimanyu Cruces 12/10/2020 8:14 AM

## 2020-12-10 NOTE — Progress Notes (Signed)
PHARMACIST - PHYSICIAN COMMUNICATION  CONCERNING:  Enoxaparin (Lovenox) for DVT Prophylaxis    RECOMMENDATION: Patient was prescribed enoxaprin 76m q24 hours for VTE prophylaxis.   Filed Weights   12/03/20 0429 12/04/20 0434 12/05/20 0453  Weight: 102.4 kg (225 lb 12 oz) 98.1 kg (216 lb 4.3 oz) 97.2 kg (214 lb 4.6 oz)    Body mass index is 37.96 kg/m.  Estimated Creatinine Clearance: 70.5 mL/min (by C-G formula based on SCr of 0.75 mg/dL).   [place] for enoxaparin 0.520mkg TBW SQ every 24 hours based on BMI being >30.   DESCRIPTION: [facility]  Patient is now receiving enoxaparin 50 mg every 24 hours    CaDorothe PeaPharmD, BCPS Clinical Pharmacist  12/10/2020 2:51 PM

## 2020-12-10 NOTE — Evaluation (Addendum)
Clinical/Bedside Swallow Evaluation Patient Details  Name: Gwendolyn [name] MRN: [id number] Date of Birth: 1946-03-12  Today's Date: 12/10/2020 Time: SLP Start Time (ACUTE ONLY): 0845 SLP Stop Time (ACUTE ONLY): 0945 SLP Time Calculation (min) (ACUTE ONLY): 60 min  Past Medical History:  Past Medical History:  Diagnosis Date   Anxiety    Arthritis    Asthma    Breast cancer ([place]) 01/2010   left mastectomy, LN neg, on femara   Chronic low back pain    Cirrhosis of liver (HCC)    Depression    Fibromyalgia    GERD (gastroesophageal reflux disease)    History of chicken pox    HTN (hypertension)    Hyperlipidemia    Hypothyroidism    IBS (irritable bowel syndrome)    Migraines    NASH (nonalcoholic steatohepatitis)    Followed by Dr[facility] Gerald Dexter at [facility]   PONV (postoperative nausea and vomiting)    RLS (restless legs syndrome)    Seasonal allergies    Trigeminal neuralgia    Past Surgical History:  Past Surgical History:  Procedure Laterality Date   ABDOMINAL HYSTERECTOMY     ANKLE SURGERY  2006   BREAST BIOPSY  2011   [place]   CHOLECYSTECTOMY  2005   CYSTOCELE REPAIR     DILATION AND CURETTAGE OF UTERUS     GASTRIC BYPASS  2008   KNEE ARTHROSCOPY Left 09/17/2018   Procedure: KNEE ARTHROSCOPY WITH DEBRIDEMENT, PARTIAL MEDIAL MENISCECTOMY AND SUB-CHONDROPLASTY OF A BONE MARROW LESION INVOLVING THE MEIDAL TIBIAL PLATEAU;  Surgeon: Corky Mull, MD;  Location: [facility] ORS;  Service: Orthopedics;  Laterality: Left;   MASTECTOMY  2011   Breast Cancer   RECTOCELE REPAIR     ROTATOR CUFF REPAIR  2001   SEPTOPLASTY     TONSILLECTOMY  age 75   TOTAL ABDOMINAL HYSTERECTOMY W/ BILATERAL SALPINGOOPHORECTOMY  1991   VAGINAL DELIVERY     x3   HPI:  Pt  is a 75 y.o. femalewho presents to the ED for evaluation of altered mentation and somnolence.   Chart review indicates history of MULTIPLE medical dxs including anxiety/depression, GERD, fibromyalgia, IBD, NASH, CVA, depression, Cognitive  Impairment, leg swelling, chronic lumbar radiculopathy and osteoarthritis and Suboxone.  Pt recently moved in w/ a friend who noted pt has not been taking her Medications as prescribed.  Pt was intubated for airway protection in the setting of toxic-metabolic encephalopathy after apparent unintentional multidrug overdose of multiple prescription medications at admit; she was successfully extubated on 8/19, she remains encephalopathic.  Palliative Care consult for [place].  Pt began arousing on 12/09/2020 and consult placed for BSE.  Of note, pt has a Left submandibular gland sialoadenitis with no abscess or stone per ENT.   Assessment / Plan / Recommendation Clinical Impression  Pt appears to present w/ Mild-Mod oropharyngeal phase dysphagia w/ impacted from decreased awareness during oral intake tasks d/t declined Cognitive status. Per chart, there is Cognitive decline baseline but also new encephalopathy this admit -- any decline impacts overall awareness/engagement w/ po tasks which can increase risk for aspiration. Pt also has a baseline of GERD. Though aspiration risk is present, the risk appears reduced when following general aspiration precautions, when supported during the meal w/ setup and guidance w/ self-feeding, and when using a modified diet including Nectar consistency liquids in the diet.  She required mod verbal/tactile/visual cues during bolus trials and an overall need to REDUCE DISTRACTIONS during po trials.     Pt  was positioned upright in bed then supported in self-feeding/consuming trials of thin liquids VIA CUP, Nectar liquids via Cup, purees, and soft solids w/ overt clinical s/s of aspiration noted (coughing) w/ the thin liquid trials. No overt clinical s/s of aspiration noted w/ the Nectar liquids and food trials; no decline in phonations, no cough, and no decline in respiratory status during/post trials. Oral phase was grossly [facility] for bolus management and oral clearing of the liquid  boluses given; however, min slower mastication and oral clearing time w/ the increased textured foods. Improved oral phase time was noted when giving broken down/moistened foods. Lingual sweeping was encouraged to clear orally b/t bites. Small sips were encouraged; noted Min+ decreased awareness during oral prep stage.    Discussed diet consistency of foods/Nectar liquids; aspiration precautions; pills Crushed in puree; self-feeding support and supervision at meals w/ NSG. Handouts posted in room, chart.   Recommend a Dysphagia level 2(minced) w/ Nectar liquids VIA CUP; general aspiration precautions; reduce Distractions during meals. Pills Crushed in Puree for safer swallowing. Support at all meals d/t Cognitive decline. Dietician f/u for potential drink supplements to support nutrition also. Palliative Care f/u for overall GOC. NSG/MD updated. Pt can be followed at next venue of care for trials to upgrade diet and ongoing assessment as medical/Cognitive status' continue to improve hopefully. SLP Visit Diagnosis: Dysphagia, oropharyngeal phase (R13.12) (cognitive decline baseline)    Aspiration Risk  Mild aspiration risk;Moderate aspiration risk;Risk for inadequate nutrition/hydration    Diet Recommendation   Dysphagia level 2(minced) w/ Nectar liquids VIA CUP; general aspiration precautions; reduce Distractions during meals. Support w/ feeding at all meals d/t Cognitive decline. REFLUX precautions.  Medication Administration: Crushed with puree    Other  Recommendations Recommended Consults:  (Dietician; Palliative Care) Oral Care Recommendations: Oral care BID;Oral care before and after PO;Staff/trained caregiver to provide oral care Other Recommendations: Order thickener from pharmacy;Prohibited food (jello, ice cream, thin soups);Remove water pitcher;Have oral suction available   Follow up Recommendations Skilled Nursing facility      Frequency and Duration   F/u at next venue of care as  Cognitive and medical status' improve overall         Prognosis Prognosis for Safe Diet Advancement: Fair Barriers to Reach Goals: Cognitive deficits;Time post onset;Severity of deficits      Swallow Study   General Date of Onset: 12/02/20 HPI: Pt  is a 75 y.o. femalewho presents to the ED for evaluation of altered mentation and somnolence.   Chart review indicates history of MULTIPLE medical dxs including anxiety/depression, GERD, fibromyalgia, IBD, NASH, CVA, depression, Cognitive Impairment, leg swelling, chronic lumbar radiculopathy and osteoarthritis and Suboxone.  Pt recently moved in w/ a friend who noted pt has not been taking her Medications as prescribed.  Pt was intubated for airway protection in the setting of toxic-metabolic encephalopathy after apparent unintentional multidrug overdose of multiple prescription medications at admit; she was successfully extubated on 8/19, she remains encephalopathic.  Palliative Care consult for [place].  Pt began arousing on 12/09/2020 and consult placed for BSE.  Of note, pt has a Left submandibular gland sialoadenitis with no abscess or stone per ENT. Type of Study: Bedside Swallow Evaluation Previous Swallow Assessment: none Diet Prior to this Study: NPO Temperature Spikes Noted: No (wbc 6.9) Respiratory Status: Room air History of Recent Intubation: Yes Length of Intubations (days): 2 days Date extubated: 12/03/20 Behavior/Cognition: Alert;Cooperative;Pleasant mood;Confused;Distractible;Requires cueing (baseline Cognitive decline per chart) Oral Cavity Assessment: Dry;Dried secretions (Sticky secretions) Oral Care  Completed by SLP: Yes Oral Cavity - Dentition: Adequate natural dentition Vision: Functional for self-feeding Self-Feeding Abilities: Able to feed self;Needs assist;Needs set up;Total assist (Cognitive) Patient Positioning: Upright in bed (needed positioning) Baseline Vocal Quality: Normal Volitional Cough: Strong Volitional  Swallow: Able to elicit    Oral/Motor/Sensory Function Overall Oral Motor/Sensory Function: Mild impairment Facial ROM: Within Functional Limits Facial Symmetry: Within Functional Limits Lingual ROM: Within Functional Limits Lingual Symmetry: Within Functional Limits Lingual Strength: Within Functional Limits Velum:  (lack of lift/movement) Mandible: Within Functional Limits   Ice Chips Ice chips: Within functional limits Presentation: Spoon (fed; 8 trials)   Thin Liquid Thin Liquid: Impaired Presentation: Cup;Self Fed (supported; 10 trials) Oral Phase Impairments:  (none) Pharyngeal  Phase Impairments: Cough - Immediate;Cough - Delayed (x5/8 trials)    Nectar Thick Nectar Thick Liquid: Within functional limits Presentation: Cup;Self Fed (~3-4 ozs)   Honey Thick Honey Thick Liquid: Not tested   Puree Puree: Within functional limits Presentation: Spoon (fed; 10 trials)   Solid     Solid: Impaired Presentation: Spoon (fed; 8 trials) Oral Phase Impairments: Poor awareness of bolus;Impaired mastication (min) Oral Phase Functional Implications: Prolonged oral transit Pharyngeal Phase Impairments:  (none)       Orinda Kenner, MS, [employer] Speech Language Pathologist Rehab Services [phone] [facility] 12/10/2020,2:09 PM

## 2020-12-10 NOTE — Progress Notes (Signed)
PROGRESS NOTE    Gwendolyn [name]  VQX:[id number] DOB: 1945/11/18 DOA: 12/02/2020 PCP: Barbaraann Boys, MD   Chief complaint.  Altered mental status. Brief Narrative:   75 y.o female with significant PMH as below who presented to the ED with chief complaints of altered mental status and near syncopal episode.   patient recently moved to an assisted living at [facility].  Patient apparently has not been taking her home medication which consist of Lexapro 20 mg, Requip, Cymbalta, Lyrica, and buprenorphine for unknown period of time.  Per POA who manages her medication, today was the first time the patient took her medications.  Patient's POA states that they were walking 30 minutes after she took her medication around 330 when she suddenly complained of feeling dizzy near syncope.   Patient was able to sit down but was soon noted to be somnolent, clammy and incoherent.  POA also stated that patient's eyes appeared to be rolling backwards but did not lose consciousness entirely.  EMS was called and patient was transported to the ED.     Given concerns for possible accidental overdose, patient was intubated for airway protection and respiratory failure.  PCCM consulted for admission to ICU.  Pt was extubated on 8/19 and transferred to hospitalist service on 8/21.  Patient was not responsive after extubation, POA had made a decision to transition to comfort care.  However, patient started waking up 8/25.   Assessment & Plan:   Active Problems:   Acute metabolic encephalopathy   Acute respiratory failure with hypoxia ([place])  #1.  Acute hypoxemic respiratory failure Acute metabolic encephalopathy. Patient was requiring intubation for airway protection. Patient has woke up yesterday.  Resume diet after seen by speech therapy.  #2.  Sialoadenitis with left neck swelling. This is due to dehydration.  Per ENT recommendation, we will continue antibiotics, steroids and IV fluid  rehydration.  3.  Hypernatremia. Secondary to dehydration. Continue lactated Ringer solution.  4.  Mild thrombocytopenia. Continue to follow.  5. Paroxysmal atrial fibrillation.   History of a CVA. Mild dementia. Patient was not on anticoagulation at home.  6.  Essential hypertension. Morbid obesity.   Discussed with patient POA yesterday, comfort care is canceled.  Will resume treatment.  DVT prophylaxis: Lovenox Code Status: DNR Family Communication: as above Disposition Plan:    Status is: Inpatient  Remains inpatient appropriate because:IV treatments appropriate due to intensity of illness or inability to take PO and Inpatient level of care appropriate due to severity of illness  Dispo: The patient is from: Home              Anticipated d/c is to: Home              Patient currently is not medically stable to d/c.   Difficult to place patient No        I/O last 3 completed shifts: In: 1167.2 [I.V.:967.2; IV Piggyback:200] Out: 0034 [Urine:1650] Total I/O In: 0  Out: 500 [Urine:500]     Consultants:  none  Procedures: None  Antimicrobials: Unasyn  Subjective: Patient woke up since yesterday, she can maintain a conversation.  She still has some confusion. Denies any short of breath or cough. She is able to take small amount of food in.  No nausea vomiting abdominal pain. No fever or chills. No dysuria hematuria. Objective: Vitals:   12/09/20 2036 12/10/20 0406 12/10/20 0735 12/10/20 1116  BP: (!) 142/94 (!) 143/77 (!) 162/85 136/62  Pulse: 82 80  95 65  Resp: 20 20    Temp: 98.2 F (36.8 C) 98.6 F (37 C) 97.9 F (36.6 C) 98.2 F (36.8 C)  TempSrc: Oral  Oral   SpO2: 96% 96% 92% 93%  Weight:      Height:        Intake/Output Summary (Last 24 hours) at 12/10/2020 1437 Last data filed at 12/10/2020 1037 Gross per 24 hour  Intake 1167.16 ml  Output 1500 ml  Net -332.84 ml   Filed Weights   12/03/20 0429 12/04/20 0434 12/05/20 0453   Weight: 102.4 kg 98.1 kg 97.2 kg    Examination:  General exam: Appears calm and comfortable  Respiratory system: Clear to auscultation. Respiratory effort normal. Cardiovascular system: S1 & S2 heard, RRR. No JVD, murmurs, rubs, gallops or clicks. No pedal edema. Gastrointestinal system: Abdomen is nondistended, soft and nontender. No organomegaly or masses felt. Normal bowel sounds heard. Central nervous system: Alert and oriented x3. No focal neurological deficits. Extremities: Symmetric 5 x 5 power. Skin: No rashes, lesions or ulcers Psychiatry: Judgement and insight appear normal. Mood & affect appropriate.     Data Reviewed: I have personally reviewed following labs and imaging studies  CBC: Recent Labs  Lab 12/04/20 0618 12/05/20 0332 12/06/20 0032 12/07/20 0546 12/10/20 0712  WBC 6.4 5.2 4.1 3.6* 6.9  NEUTROABS  --   --   --   --  5.3  HGB 11.8* 12.2 11.7* 12.2 12.4  HCT 37.7 39.5 37.0 37.2 39.3  MCV 97.2 97.1 94.9 95.4 97.0  PLT 123* 154 135* 135* 696*   Basic Metabolic Panel: Recent Labs  Lab 12/03/20 1620 12/03/20 1620 12/04/20 0618 12/05/20 0332 12/05/20 1351 12/06/20 0032 12/07/20 0546 12/10/20 0712  NA  --   --  139 142  --  145 150* 147*  K  --    < > 4.7 5.2* 5.0 3.9 3.4* 3.5  CL  --   --  108 111  --  114* 111 110  CO2  --   --  24 26  --  27 30 30   GLUCOSE  --   --  118* 128*  --  120* 121* 97  BUN  --   --  27* 34*  --  32* 29* 25*  CREATININE  --   --  0.94 0.91  --  0.82 0.78 0.75  CALCIUM  --   --  9.7 10.3  --  10.8* 10.8* 9.7  MG 2.1  --  2.1 2.4  --  2.2 2.1 2.1  PHOS 5.2*  --  3.8 3.5  --   --   --  3.2   < > = values in this interval not displayed.   GFR: Estimated Creatinine Clearance: 70.5 mL/min (by C-G formula based on SCr of 0.75 mg/dL). Liver Function Tests: Recent Labs  Lab 12/04/20 0618 12/05/20 0332 12/06/20 0032  AST 77* 93* 73*  ALT 34 37 36  ALKPHOS 75 83 79  BILITOT 1.1 1.5* 1.1  PROT 6.1* 6.7 6.1*  ALBUMIN  2.9* 2.9* 2.7*   No results for input(s): LIPASE, AMYLASE in the last 168 hours. Recent Labs  Lab 12/04/20 0618  AMMONIA 58*   Coagulation Profile: Recent Labs  Lab 12/04/20 0857  INR 1.1   Cardiac Enzymes: No results for input(s): CKTOTAL, CKMB, CKMBINDEX, TROPONINI in the last 168 hours. BNP (last 3 results) No results for input(s): PROBNP in the last 8760 hours. HbA1C: No results for input(s): HGBA1C  in the last 72 hours. CBG: Recent Labs  Lab 12/06/20 2015 12/06/20 2312 12/07/20 0340 12/07/20 0712 12/07/20 1137  GLUCAP 105* 119* 104* 106* 132*   Lipid Profile: No results for input(s): CHOL, HDL, LDLCALC, TRIG, CHOLHDL, LDLDIRECT in the last 72 hours. Thyroid Function Tests: No results for input(s): TSH, T4TOTAL, FREET4, T3FREE, THYROIDAB in the last 72 hours. Anemia Panel: No results for input(s): VITAMINB12, FOLATE, FERRITIN, TIBC, IRON, RETICCTPCT in the last 72 hours. Sepsis Labs: No results for input(s): PROCALCITON, LATICACIDVEN in the last 168 hours.  Recent Results (from the past 240 hour(s))  Resp Panel by RT-PCR (Flu A&B, Covid) Nasopharyngeal Swab     Status: None   Collection Time: 12/02/20  5:55 PM   Specimen: Nasopharyngeal Swab; Nasopharyngeal(NP) swabs in vial transport medium  Result Value Ref Range Status   SARS Coronavirus 2 by RT PCR NEGATIVE NEGATIVE Final    Comment: (NOTE) SARS-CoV-2 target nucleic acids are NOT DETECTED.  The SARS-CoV-2 RNA is generally detectable in upper respiratory specimens during the acute phase of infection. The lowest concentration of SARS-CoV-2 viral copies this assay can detect is 138 copies/mL. A negative result does not preclude SARS-Cov-2 infection and should not be used as the sole basis for treatment or other patient management decisions. A negative result may occur with  improper specimen collection/handling, submission of specimen other than nasopharyngeal swab, presence of viral mutation(s) within  the areas targeted by this assay, and inadequate number of viral copies(<138 copies/mL). A negative result must be combined with clinical observations, patient history, and epidemiological information. The expected result is Negative.  Fact Sheet for Patients:  EntrepreneurPulse.com.au  Fact Sheet for Healthcare Providers:  IncredibleEmployment.be  This test is no t yet approved or cleared by the [place] FDA and  has been authorized for detection and/or diagnosis of SARS-CoV-2 by FDA under an Emergency Use Authorization (EUA). This EUA will remain  in effect (meaning this test can be used) for the duration of the COVID-19 declaration under Section 564(b)(1) of the Act, 21 U.S.C.section 360bbb-3(b)(1), unless the authorization is terminated  or revoked sooner.       Influenza A by PCR NEGATIVE NEGATIVE Final   Influenza B by PCR NEGATIVE NEGATIVE Final    Comment: (NOTE) The Xpert Xpress SARS-CoV-2/FLU/RSV plus assay is intended as an aid in the diagnosis of influenza from Nasopharyngeal swab specimens and should not be used as a sole basis for treatment. Nasal washings and aspirates are unacceptable for Xpert Xpress SARS-CoV-2/FLU/RSV testing.  Fact Sheet for Patients: EntrepreneurPulse.com.au  Fact Sheet for Healthcare Providers: IncredibleEmployment.be  This test is not yet approved or cleared by the [place] FDA and has been authorized for detection and/or diagnosis of SARS-CoV-2 by FDA under an Emergency Use Authorization (EUA). This EUA will remain in effect (meaning this test can be used) for the duration of the COVID-19 declaration under Section 564(b)(1) of the Act, 21 U.S.C. section 360bbb-3(b)(1), unless the authorization is terminated or revoked.  Performed at [facility], [address] [place], [place] [address]   MRSA Next Gen by PCR, Nasal     Status: None    Collection Time: 12/02/20  9:39 PM   Specimen: Nasal Mucosa; Nasal Swab  Result Value Ref Range Status   MRSA by PCR Next Gen NOT DETECTED NOT DETECTED Final    Comment: (NOTE) The GeneXpert MRSA Assay (FDA approved for NASAL specimens only), is one component of a comprehensive MRSA colonization surveillance program. It is  not intended to diagnose MRSA infection nor to guide or monitor treatment for MRSA infections. Test performance is not FDA approved in patients less than 61 years old. Performed at [facility], [address] [place], [place] [address]          Radiology Studies: CT HEAD WO CONTRAST (5MM)  Result Date: 12/09/2020 CLINICAL DATA:  Delirium, neck swelling EXAM: CT HEAD WITHOUT CONTRAST TECHNIQUE: Contiguous axial images were obtained from the base of the skull through the vertex without intravenous contrast. COMPARISON:  12/06/2020 FINDINGS: Brain: Chronic right basal ganglia lacunar infarct. No signs of acute infarct or hemorrhage. Lateral ventricles and remaining midline structures are unremarkable. No acute extra-axial fluid collections. No mass effect. Vascular: No hyperdense vessel or unexpected calcification. Skull: Normal. Negative for fracture or focal lesion. Sinuses/Orbits: Mild mucosal thickening within the right maxillary, ethmoid, and sphenoid sinus. No gas fluid levels. Other: None. IMPRESSION: 1. Stable head CT.  No acute intracranial process. Electronically Signed   By: Randa Ngo M.D.   On: 12/09/2020 16:00   CT SOFT TISSUE NECK W CONTRAST  Result Date: 12/09/2020 CLINICAL DATA:  Neck abscess, deep tissue. EXAM: CT NECK WITH CONTRAST TECHNIQUE: Multidetector CT imaging of the neck was performed using the standard protocol following the bolus administration of intravenous contrast. CONTRAST:  32m OMNIPAQUE IOHEXOL 350 MG/ML SOLN COMPARISON:  12/02/2020 FINDINGS: Pharynx and larynx: No evidence of mucosal mass lesion. There appears to be  sialoadenitis of the left submandibular gland with inflammatory change also affecting the parapharyngeal space on the left, probably with some mucosal edema on the left. Salivary glands: As noted above, both parotid glands in the right submandibular gland are normal. The left submandibular gland is enlarged and edematous consistent with sialoadenitis. No visible ductal stone. Surrounding soft tissue inflammatory changes without a defined drainable abscess. Thyroid: Normal Lymph nodes: No enlarged or low-density lymph nodes. Vascular: Ordinary mild atherosclerotic calcification at both carotid bifurcations. Limited intracranial: Negative Visualized orbits: Normal Mastoids and visualized paranasal sinuses: Clear except for mild mucosal thickening of the right maxillary sinus. Skeleton: Scoliotic curvature convex to the left. Minimal cervical spondylosis. Upper chest: Normal Other: None IMPRESSION: Sialoadenitis of the submandibular gland on the left with swelling and surrounding soft tissue edematous change. This includes inflammatory edema of the left parapharyngeal space and probably of the left hypopharyngeal mucosa. No evidence of an actual drainable fluid collection. No evidence of stone disease as an etiology of the inflammation. Electronically Signed   By: MNelson ChimesM.D.   On: 12/09/2020 15:49        Scheduled Meds:  chlorhexidine gluconate (MEDLINE KIT)  15 mL Mouth Rinse BID   Chlorhexidine Gluconate Cloth  6 each Topical Q0600   methylPREDNISolone (SOLU-MEDROL) injection  40 mg Intravenous Daily   Continuous Infusions:  sodium chloride Stopped (12/03/20 0427)   ampicillin-sulbactam (UNASYN) IV 3 g (12/10/20 0942)   lactated ringers 100 mL/hr at 12/10/20 0323     LOS: 8 days    Time spent: 32 minutes    DSharen Hones MD [facility]   To contact the attending provider between 7A-7P or the covering provider during after hours 7P-7A, please log into the web site [email or web address]  and access using universal [place] password for that web site. If you do not have the password, please call the hospital operator.  12/10/2020, 2:37 PM

## 2020-12-10 NOTE — Plan of Care (Signed)
  Problem: Pain Managment: Goal: General experience of comfort will improve Outcome: Progressing   

## 2020-12-11 DIAGNOSIS — J9601 Acute respiratory failure with hypoxia: Secondary | ICD-10-CM | POA: Diagnosis not present

## 2020-12-11 DIAGNOSIS — D696 Thrombocytopenia, unspecified: Secondary | ICD-10-CM | POA: Diagnosis not present

## 2020-12-11 DIAGNOSIS — G9341 Metabolic encephalopathy: Secondary | ICD-10-CM | POA: Diagnosis not present

## 2020-12-11 LAB — BASIC METABOLIC PANEL
Anion gap: 4 — ABNORMAL LOW (ref 5–15)
BUN: 27 mg/dL — ABNORMAL HIGH (ref 8–23)
CO2: 32 mmol/L (ref 22–32)
Calcium: 10.2 mg/dL (ref 8.9–10.3)
Chloride: 110 mmol/L (ref 98–111)
Creatinine, Ser: 0.85 mg/dL (ref 0.44–1.00)
GFR, Estimated: >60 mL/min (ref >60)
Glucose, Bld: 116 mg/dL — ABNORMAL HIGH (ref 70–99)
Potassium: 3.2 mmol/L — ABNORMAL LOW (ref 3.5–5.1)
Sodium: 146 mmol/L — ABNORMAL HIGH (ref 135–145)

## 2020-12-11 LAB — PHOSPHORUS: Phosphorus: 3.3 mg/dL (ref 2.5–4.6)

## 2020-12-11 LAB — MAGNESIUM: Magnesium: 2.1 mg/dL (ref 1.7–2.4)

## 2020-12-11 MED ORDER — POTASSIUM CHLORIDE 10 MEQ/100ML IV SOLN
10.0000 meq | INTRAVENOUS | Status: AC
Start: 2020-12-11 — End: 2020-12-11
  Administered 2020-12-11 (×4): 10 meq via INTRAVENOUS
  Filled 2020-12-11: qty 100

## 2020-12-11 MED ORDER — AMOXICILLIN-POT CLAVULANATE 875-125 MG PO TABS
1.0000 | ORAL_TABLET | Freq: Two times a day (BID) | ORAL | Status: DC
  Administered 2020-12-11 – 2020-12-12 (×2): 1 via ORAL
  Filled 2020-12-11 (×3): qty 1

## 2020-12-11 MED ORDER — SODIUM CHLORIDE 0.9% FLUSH: 10.0000 mL | INTRAVENOUS | Status: DC | PRN

## 2020-12-11 MED ORDER — ENSURE ENLIVE PO LIQD
237.0000 mL | Freq: Two times a day (BID) | ORAL | Status: DC
  Administered 2020-12-11 – 2020-12-12 (×3): 237 mL via ORAL

## 2020-12-11 NOTE — TOC Progression Note (Signed)
Transition of Care [facility]) - Progression Note    Patient Details  Name: Gwendolyn [name] MRN: [id number] Date of Birth: [birth year]  Transition of Care [facility]) CM/SW [place], LCSW Phone Number: 12/11/2020, 2:57 PM  Clinical Narrative:   PT has reevaluated and recommends shot term rehab. CSW left VM for Shirley at [facility].     Expected Discharge Plan: Assisted Living Barriers to Discharge: Continued Medical Work up  Expected Discharge Plan and Services Expected Discharge Plan: Assisted Living       Living arrangements for the past 2 months: [place] Expected Discharge Date: 12/09/20                                     Social Determinants of Health (SDOH) Interventions    Readmission Risk Interventions Readmission Risk Prevention Plan 12/04/2020  Post Dischage Appt Complete  Medication Screening Complete  Transportation Screening Complete  Some recent data might be hidden

## 2020-12-11 NOTE — Plan of Care (Signed)
Pt seen for re-evaluation with improvement in status. Upgraded goals to reflect pt's new CLOF.   Gwendolyn [name], PT, DPT 12/11/20, 9:19 AM  Problem: Acute Rehab PT Goals(only PT should resolve) Goal: Pt Will Go Supine/Side To Sit Flowsheets (Taken 12/11/2020 0918) Pt will go Supine/Side to Sit: Independently Goal: Pt Will Go Sit To Supine/Side Flowsheets (Taken 12/11/2020 0918) Pt will go Sit to Supine/Side: Independently Goal: Pt Will Transfer Bed To Chair/Chair To Bed Flowsheets (Taken 12/11/2020 0918) Pt will Transfer Bed to Chair/Chair to Bed: with modified independence Note: With LRAD Goal: Pt Will Ambulate Flowsheets (Taken 12/11/2020 0918) Pt will Ambulate:  > 125 feet  with modified independence  with least restrictive assistive device

## 2020-12-11 NOTE — Progress Notes (Signed)
PROGRESS NOTE    Gwendolyn [name]  SJG:[id number] DOB: June 11, 1945 DOA: 12/02/2020 PCP: Barbaraann Boys, MD   Chief complaint.  Altered mental status. Brief Narrative:   75 y.o female with significant PMH as below who presented to the ED with chief complaints of altered mental status and near syncopal episode.   patient recently moved to an assisted living at [place].  Patient apparently has not been taking her home medication which consist of Lexapro 20 mg, Requip, Cymbalta, Lyrica, and buprenorphine for unknown period of time.  Per POA who manages her medication, today was the first time the patient took her medications.  Patient's POA states that they were walking 30 minutes after she took her medication around 330 when she suddenly complained of feeling dizzy near syncope.   Patient was able to sit down but was soon noted to be somnolent, clammy and incoherent.  POA also stated that patient's eyes appeared to be rolling backwards but did not lose consciousness entirely.  EMS was called and patient was transported to the ED.     Given concerns for possible accidental overdose, patient was intubated for airway protection and respiratory failure.  PCCM consulted for admission to ICU.  Pt was extubated on 8/19 and transferred to hospitalist service on 8/21.   Patient was not responsive after extubation, POA had made a decision to transition to comfort care.  However, patient started waking up 8/25.   Assessment & Plan:   Active Problems:   Essential hypertension, benign   Acute metabolic encephalopathy   Acute respiratory failure with hypoxia (HCC)   Hypernatremia   Thrombocytopenia ([place])  #1.  Acute hypoxemic respiratory failure Acute metabolic encephalopathy. Patient was requiring intubation for airway protection. Patient condition seem to be improved, currently she is alert and oriented to time place and person.  We still do not know what happened that caused severe mental  status changes.  Family states that she was recently started on multiple medicines which she has not been taking before. At this point, patient condition is better.  I will add Ensure to try to improve nutrition status. Patient has not been eating for the last few days, I will monitor her phosphorus level and electrolytes daily before discharge.  #2.  Sialoadenitis with the left neck swelling. Condition improved.  I will discontinue steroids.  Change antibiotic to oral.  IV fluids discontinued, as patient was taking fluids.  3.  Hypernatremia. Hypokalemia. Sodium level has normalized.  Continue replete potassium.  Continue monitor potassium magnesium and phosphorus.  4.  Mild thrombocytopenia. Stable.  5.  Paroxysmal atrial fibrillation. History of CVA. Mild dementia. Condition stable.    DVT prophylaxis: Lovenox Code Status: DNR Family Communication: Son updated at bedside. Disposition Plan:    Status is: Inpatient  Remains inpatient appropriate because:Inpatient level of care appropriate due to severity of illness  Dispo: The patient is from: Home              Anticipated d/c is to: SNF              Patient currently is not medically stable to d/c.   Difficult to place patient No        I/O last 3 completed shifts: In: 2183 [P.O.:120; I.V.:1863; IV Piggyback:200] Out: 2725 [Urine:2725] No intake/output data recorded.     Consultants:  None  Procedures: None  Antimicrobials: Augmentin.  Subjective: Patient mental status much improved today, she is able to talk, she no longer  is confused.  She still have some memory issue. No short of breath or cough. No abdominal pain nausea vomiting No dysuria or hematuria.  Objective: Vitals:   12/10/20 0735 12/10/20 1116 12/10/20 1534 12/11/20 0747  BP: (!) 162/85 136/62 (!) 157/73 (!) 149/59  Pulse: 95 65 74 76  Resp:    20  Temp: 97.9 F (36.6 C) 98.2 F (36.8 C) 97.7 F (36.5 C) 98.1 F (36.7 C)  TempSrc:  Oral  Oral   SpO2: 92% 93% 95% 97%  Weight:      Height:        Intake/Output Summary (Last 24 hours) at 12/11/2020 1422 Last data filed at 12/11/2020 0641 Gross per 24 hour  Intake 1115.87 ml  Output 1000 ml  Net 115.87 ml   Filed Weights   12/03/20 0429 12/04/20 0434 12/05/20 0453  Weight: 102.4 kg 98.1 kg 97.2 kg    Examination:  General exam: Appears calm and comfortable  Respiratory system: Clear to auscultation. Respiratory effort normal. Cardiovascular system: S1 & S2 heard, RRR. No JVD, murmurs, rubs, gallops or clicks. No pedal edema. Gastrointestinal system: Abdomen is nondistended, soft and nontender. No organomegaly or masses felt. Normal bowel sounds heard. Central nervous system: Alert and oriented x3. No focal neurological deficits. Extremities: Symmetric 5 x 5 power. Skin: No rashes, lesions or ulcers Psychiatry: Judgement and insight appear normal. Mood & affect appropriate.     Data Reviewed: I have personally reviewed following labs and imaging studies  CBC: Recent Labs  Lab 12/05/20 0332 12/06/20 0032 12/07/20 0546 12/10/20 0712  WBC 5.2 4.1 3.6* 6.9  NEUTROABS  --   --   --  5.3  HGB 12.2 11.7* 12.2 12.4  HCT 39.5 37.0 37.2 39.3  MCV 97.1 94.9 95.4 97.0  PLT 154 135* 135* 073*   Basic Metabolic Panel: Recent Labs  Lab 12/05/20 0332 12/05/20 1351 12/06/20 0032 12/07/20 0546 12/10/20 0712 12/11/20 0504  NA 142  --  145 150* 147* 146*  K 5.2* 5.0 3.9 3.4* 3.5 3.2*  CL 111  --  114* 111 110 110  CO2 26  --  27 30 30  32  GLUCOSE 128*  --  120* 121* 97 116*  BUN 34*  --  32* 29* 25* 27*  CREATININE 0.91  --  0.82 0.78 0.75 0.85  CALCIUM 10.3  --  10.8* 10.8* 9.7 10.2  MG 2.4  --  2.2 2.1 2.1 2.1  PHOS 3.5  --   --   --  3.2 3.3   GFR: Estimated Creatinine Clearance: 66.4 mL/min (by C-G formula based on SCr of 0.85 mg/dL). Liver Function Tests: Recent Labs  Lab 12/05/20 0332 12/06/20 0032  AST 93* 73*  ALT 37 36  ALKPHOS 83 79   BILITOT 1.5* 1.1  PROT 6.7 6.1*  ALBUMIN 2.9* 2.7*   No results for input(s): LIPASE, AMYLASE in the last 168 hours. No results for input(s): AMMONIA in the last 168 hours. Coagulation Profile: No results for input(s): INR, PROTIME in the last 168 hours. Cardiac Enzymes: No results for input(s): CKTOTAL, CKMB, CKMBINDEX, TROPONINI in the last 168 hours. BNP (last 3 results) No results for input(s): PROBNP in the last 8760 hours. HbA1C: No results for input(s): HGBA1C in the last 72 hours. CBG: Recent Labs  Lab 12/06/20 2015 12/06/20 2312 12/07/20 0340 12/07/20 0712 12/07/20 1137  GLUCAP 105* 119* 104* 106* 132*   Lipid Profile: No results for input(s): CHOL, HDL, LDLCALC, TRIG, CHOLHDL, LDLDIRECT  in the last 72 hours. Thyroid Function Tests: No results for input(s): TSH, T4TOTAL, FREET4, T3FREE, THYROIDAB in the last 72 hours. Anemia Panel: No results for input(s): VITAMINB12, FOLATE, FERRITIN, TIBC, IRON, RETICCTPCT in the last 72 hours. Sepsis Labs: No results for input(s): PROCALCITON, LATICACIDVEN in the last 168 hours.  Recent Results (from the past 240 hour(s))  Resp Panel by RT-PCR (Flu A&B, Covid) Nasopharyngeal Swab     Status: None   Collection Time: 12/02/20  5:55 PM   Specimen: Nasopharyngeal Swab; Nasopharyngeal(NP) swabs in vial transport medium  Result Value Ref Range Status   SARS Coronavirus 2 by RT PCR NEGATIVE NEGATIVE Final    Comment: (NOTE) SARS-CoV-2 target nucleic acids are NOT DETECTED.  The SARS-CoV-2 RNA is generally detectable in upper respiratory specimens during the acute phase of infection. The lowest concentration of SARS-CoV-2 viral copies this assay can detect is 138 copies/mL. A negative result does not preclude SARS-Cov-2 infection and should not be used as the sole basis for treatment or other patient management decisions. A negative result may occur with  improper specimen collection/handling, submission of specimen other than  nasopharyngeal swab, presence of viral mutation(s) within the areas targeted by this assay, and inadequate number of viral copies(<138 copies/mL). A negative result must be combined with clinical observations, patient history, and epidemiological information. The expected result is Negative.  Fact Sheet for Patients:  EntrepreneurPulse.com.au  Fact Sheet for Healthcare Providers:  IncredibleEmployment.be  This test is no t yet approved or cleared by the [place] FDA and  has been authorized for detection and/or diagnosis of SARS-CoV-2 by FDA under an Emergency Use Authorization (EUA). This EUA will remain  in effect (meaning this test can be used) for the duration of the COVID-19 declaration under Section 564(b)(1) of the Act, 21 U.S.C.section 360bbb-3(b)(1), unless the authorization is terminated  or revoked sooner.       Influenza A by PCR NEGATIVE NEGATIVE Final   Influenza B by PCR NEGATIVE NEGATIVE Final    Comment: (NOTE) The Xpert Xpress SARS-CoV-2/FLU/RSV plus assay is intended as an aid in the diagnosis of influenza from Nasopharyngeal swab specimens and should not be used as a sole basis for treatment. Nasal washings and aspirates are unacceptable for Xpert Xpress SARS-CoV-2/FLU/RSV testing.  Fact Sheet for Patients: EntrepreneurPulse.com.au  Fact Sheet for Healthcare Providers: IncredibleEmployment.be  This test is not yet approved or cleared by the [place] FDA and has been authorized for detection and/or diagnosis of SARS-CoV-2 by FDA under an Emergency Use Authorization (EUA). This EUA will remain in effect (meaning this test can be used) for the duration of the COVID-19 declaration under Section 564(b)(1) of the Act, 21 U.S.C. section 360bbb-3(b)(1), unless the authorization is terminated or revoked.  Performed at [facility], [address] [place], [place]  [id number]   MRSA Next Gen by PCR, Nasal     Status: None   Collection Time: 12/02/20  9:39 PM   Specimen: Nasal Mucosa; Nasal Swab  Result Value Ref Range Status   MRSA by PCR Next Gen NOT DETECTED NOT DETECTED Final    Comment: (NOTE) The GeneXpert MRSA Assay (FDA approved for NASAL specimens only), is one component of a comprehensive MRSA colonization surveillance program. It is not intended to diagnose MRSA infection nor to guide or monitor treatment for MRSA infections. Test performance is not FDA approved in patients less than 82 years old. Performed at [facility], [address], [place], [place] [address]  Radiology Studies: CT HEAD WO CONTRAST (5MM)  Result Date: 12/09/2020 CLINICAL DATA:  Delirium, neck swelling EXAM: CT HEAD WITHOUT CONTRAST TECHNIQUE: Contiguous axial images were obtained from the base of the skull through the vertex without intravenous contrast. COMPARISON:  12/06/2020 FINDINGS: Brain: Chronic right basal ganglia lacunar infarct. No signs of acute infarct or hemorrhage. Lateral ventricles and remaining midline structures are unremarkable. No acute extra-axial fluid collections. No mass effect. Vascular: No hyperdense vessel or unexpected calcification. Skull: Normal. Negative for fracture or focal lesion. Sinuses/Orbits: Mild mucosal thickening within the right maxillary, ethmoid, and sphenoid sinus. No gas fluid levels. Other: None. IMPRESSION: 1. Stable head CT.  No acute intracranial process. Electronically Signed   By: Randa Ngo M.D.   On: 12/09/2020 16:00   CT SOFT TISSUE NECK W CONTRAST  Result Date: 12/09/2020 CLINICAL DATA:  Neck abscess, deep tissue. EXAM: CT NECK WITH CONTRAST TECHNIQUE: Multidetector CT imaging of the neck was performed using the standard protocol following the bolus administration of intravenous contrast. CONTRAST:  8m OMNIPAQUE IOHEXOL 350 MG/ML SOLN COMPARISON:  12/02/2020 FINDINGS: Pharynx and larynx: No  evidence of mucosal mass lesion. There appears to be sialoadenitis of the left submandibular gland with inflammatory change also affecting the parapharyngeal space on the left, probably with some mucosal edema on the left. Salivary glands: As noted above, both parotid glands in the right submandibular gland are normal. The left submandibular gland is enlarged and edematous consistent with sialoadenitis. No visible ductal stone. Surrounding soft tissue inflammatory changes without a defined drainable abscess. Thyroid: Normal Lymph nodes: No enlarged or low-density lymph nodes. Vascular: Ordinary mild atherosclerotic calcification at both carotid bifurcations. Limited intracranial: Negative Visualized orbits: Normal Mastoids and visualized paranasal sinuses: Clear except for mild mucosal thickening of the right maxillary sinus. Skeleton: Scoliotic curvature convex to the left. Minimal cervical spondylosis. Upper chest: Normal Other: None IMPRESSION: Sialoadenitis of the submandibular gland on the left with swelling and surrounding soft tissue edematous change. This includes inflammatory edema of the left parapharyngeal space and probably of the left hypopharyngeal mucosa. No evidence of an actual drainable fluid collection. No evidence of stone disease as an etiology of the inflammation. Electronically Signed   By: MNelson ChimesM.D.   On: 12/09/2020 15:49        Scheduled Meds:  amoxicillin-clavulanate  1 tablet Oral Q12H   chlorhexidine gluconate (MEDLINE KIT)  15 mL Mouth Rinse BID   Chlorhexidine Gluconate Cloth  6 each Topical Q0600   enoxaparin (LOVENOX) injection  50 mg Subcutaneous Q24H   levothyroxine  75 mcg Oral Q0600   pantoprazole  40 mg Oral Daily   Continuous Infusions:  sodium chloride Stopped (12/03/20 0427)     LOS: 9 days    Time spent: 27 minutes    DSharen Hones MD [facility]   To contact the attending provider between 7A-7P or the covering provider during after  hours 7P-7A, please log into the web site [email or web address] and access using universal [place] password for that web site. If you do not have the password, please call the hospital operator.  12/11/2020, 2:22 PM

## 2020-12-11 NOTE — Evaluation (Signed)
Occupational Therapy Re-Evaluation Patient Details Name: LENELL MCCONNELL MRN: [id number] DOB: November 12, 1945 Today's Date: 12/11/2020    History of Present Illness Pt is a 75 y/o F admitted on 12/02/20 with c/c of AMS & near syncopal episode. Pt admitted for tx of possible accidental drug overdose. Chest x-ray revealed small L pleural effusion. Pt was minimally responsive & transitioned to comfort care on 12/08/20, however, 2 hours prior to transport to [place] pt demonstrated increased alertness & drinking PO fluids. Hospice Home transfer was cancelled. Pt noted to have mass on neck that was determined to be an enlarged submandibular gland.  PMH: anxiety, arthritis, asthma, breast CA (01/2010), chronic LBP, liver cirrhosis, depression, fibromyalgia, GERD, HTN, HLD, IBS, migraines, NASH, RLS, trigeminal neuralgia   Clinical Impression   Ms Heinrich was seen for OT/PT re-evaluation this date following pt removed from comfort measures. Upon arrival to room pt reclined in bed requesting water. Pt A&O x2, disoriented to time and situation, requires cues t/o session. Pt requires MIN A for bed mobility. Initial MIN A + RW for ADL t/f improving to CGA. MIN A + RW standing hairbrushing - assist for balance and thoroughness. SETUP + MIN cues self-drinking seated EOB - cues for small sips as pt drinks large sips and coughs with poor insight. Pt making excellent gains and goals updated to reflect progress. Pt continues to benefit from skilled OT services to maximize return to PLOF and minimize risk of future falls, injury, caregiver burden, and readmission. Will continue to follow POC. Discharge recommendation remains appropriate.      Follow Up Recommendations  SNF;Supervision/Assistance - 24 hour    Equipment Recommendations  Other (comment) (TBD)    Recommendations for Other Services       Precautions / Restrictions Precautions Precautions: Fall Restrictions Weight Bearing Restrictions: No       Mobility Bed Mobility Overal bed mobility: Needs Assistance Bed Mobility: Supine to Sit Rolling: Min assist              Transfers Overall transfer level: Needs assistance Equipment used: Rolling walker (2 wheeled) Transfers: Sit to/from Stand Sit to Stand: Min assist         General transfer comment: initial MIN A to stabilize RW improving to CGA c cues for safety    Balance Overall balance assessment: Needs assistance Sitting-balance support: No upper extremity supported;Feet supported Sitting balance-Leahy Scale: Good     Standing balance support: Single extremity supported;During functional activity Standing balance-Leahy Scale: Poor                             ADL either performed or assessed with clinical judgement   ADL Overall ADL's : Needs assistance/impaired                                       General ADL Comments: Initial MIN A + RW for ADL t/f improving to CGA. MIN A + RW standing hairbrushing - assist for balance and thoroughness. SETUP + MIN cues self-drinking seated EOB - cues for small sips as pt drinks large sips and coughs with poor insight      Pertinent Vitals/Pain Pain Assessment: No/denies pain     Hand Dominance     Extremity/Trunk Assessment Upper Extremity Assessment Upper Extremity Assessment: Generalized weakness   Lower Extremity Assessment Lower Extremity Assessment: Generalized weakness  Communication     Cognition Arousal/Alertness: Awake/alert Behavior During Therapy: WFL for tasks assessed/performed Overall Cognitive Status: Impaired/Different from baseline Area of Impairment: Orientation;Memory;Safety/judgement;Attention;Following commands;Awareness;Problem solving                 Orientation Level: Disoriented to;Time;Situation   Memory: Decreased recall of precautions;Decreased short-term memory Following Commands: Follows one step commands with increased  time Safety/Judgement: Decreased awareness of deficits;Decreased awareness of safety   Problem Solving: Slow processing General Comments: pt states date as Sept, 2002, unable to state situation.   General Comments       Exercises Exercises: Other exercises Other Exercises Other Exercises: Pt educated re: OT role, d/c recs, falls prevention, aspiration risk Other Exercises: grooming, sup>sit, sit<>stand x2, sitting/standing balance/tolerance     OT Goals(Current goals can be found in the care plan section) Acute Rehab OT Goals Patient Stated Goal: to go home OT Goal Formulation: With patient Time For Goal Achievement: 12/25/20 Potential to Achieve Goals: Good ADL Goals Pt Will Perform Eating: with modified independence;sitting Pt Will Perform Grooming: standing;with modified independence Pt Will Transfer to Toilet: ambulating;regular height toilet;Independently  OT Frequency: Min 2X/week           Co-evaluation PT/OT/SLP Co-Evaluation/Treatment: Yes Reason for Co-Treatment: For patient/therapist safety PT goals addressed during session: Proper use of DME;Mobility/safety with mobility OT goals addressed during session: ADL's and self-care      AM-PAC OT "6 Clicks" Daily Activity     Outcome Measure Help from another person eating meals?: A Little Help from another person taking care of personal grooming?: A Little Help from another person toileting, which includes using toliet, bedpan, or urinal?: A Little Help from another person bathing (including washing, rinsing, drying)?: A Lot Help from another person to put on and taking off regular upper body clothing?: A Little Help from another person to put on and taking off regular lower body clothing?: A Lot 6 Click Score: 16   End of Session Equipment Utilized During Treatment: Rolling walker;Gait belt Nurse Communication: Mobility status  Activity Tolerance: Patient tolerated treatment well Patient left: in chair;with  call bell/phone within reach;with chair alarm set  OT Visit Diagnosis: Other abnormalities of gait and mobility (R26.89);Muscle weakness (generalized) (M62.81)                Time: 0037-0488 OT Time Calculation (min): 21 min Charges:  OT General Charges $OT Visit: 1 Visit OT Evaluation $OT Re-eval: 1 Re-eval OT Treatments $Self Care/Home Management : 8-22 mins  Dessie Coma, M.S. OTR/L  12/11/20, 9:10 AM  ascom [phone]

## 2020-12-11 NOTE — Evaluation (Signed)
Physical Therapy Re-Evaluation Patient Details Name: Gwendolyn [name] MRN: [id number] DOB: 03-24-1946 Today's Date: 12/11/2020   History of Present Illness  Pt is a 75 y/o F admitted on 12/02/20 with c/c of AMS & near syncopal episode. Pt admitted for tx of possible accidental drug overdose. Chest x-ray revealed small L pleural effusion. Pt was minimally responsive & transitioned to comfort care on 12/08/20, however, 2 hours prior to transport to [place] pt demonstrated increased alertness & drinking PO fluids. Hospice Home transfer was cancelled. Pt noted to have mass on neck that was determined to be an enlarged submandibular gland.  PMH: anxiety, arthritis, asthma, breast CA (01/2010), chronic LBP, liver cirrhosis, depression, fibromyalgia, GERD, HTN, HLD, IBS, migraines, NASH, RLS, trigeminal neuralgia  Clinical Impression  Pt seen for PT re-evaluation with pt demonstrating significant improvement in alertness, awareness, & mobility as compared to initial PT evaluation! Pt requires min assist for bed mobility with use of hospital bed features & CGA for transfers & short distance gait in room with RW. Pt does fatigue quickly & requires extra time for mobility. Pt would benefit from STR upon d/c to maximize independence with functional mobility & reduce fall risk prior to return home. Will continue to follow pt acutely to progress gait with LRAD & independence with transfers.     Follow Up Recommendations SNF;Supervision/Assistance - 24 hour    Equipment Recommendations  None recommended by PT (TBD in next venue)    Recommendations for Other Services       Precautions / Restrictions Precautions Precautions: Fall Restrictions Weight Bearing Restrictions: No      Mobility  Bed Mobility Overal bed mobility: Needs Assistance Bed Mobility: Supine to Sit Rolling: Min assist (HOB slightly elevated)              Transfers Overall transfer level: Needs assistance Equipment used:  Rolling walker (2 wheeled) Transfers: Sit to/from Stand Sit to Stand: Min guard         General transfer comment: Cuing for hand placement to push to standing vs pulling on RW  Ambulation/Gait Ambulation/Gait assistance: Min guard Gait Distance (Feet): 10 Feet (+ 10 ft) Assistive device: Rolling walker (2 wheeled) Gait Pattern/deviations: Decreased step length - right;Decreased step length - left;Decreased stride length Gait velocity: decreased   General Gait Details: Extra time to complete turns & maneuver around objects (bed)  Stairs            Wheelchair Mobility    Modified Rankin (Stroke Patients Only)       Balance Overall balance assessment: Needs assistance Sitting-balance support: No upper extremity supported;Feet supported Sitting balance-Leahy Scale: Good Sitting balance - Comments: supervision sitting EOB   Standing balance support: Single extremity supported;During functional activity Standing balance-Leahy Scale: Poor Standing balance comment: anterior lean & progressive crouching position when standing with LUE on RW & attempting to drink/comb hair with RUE with min assist for balance                             Pertinent Vitals/Pain Pain Assessment: No/denies pain    Home Living Family/patient expects to be discharged to::  (ILF at the [facility]) Living Arrangements: Alone Available Help at Discharge:  (unsure)             Additional Comments: Per chart pt was a resident at [facility].    Prior Function Level of Independence: Independent  Comments: Independent without AD prior to admission     Hand Dominance        Extremity/Trunk Assessment   Upper Extremity Assessment Upper Extremity Assessment: Generalized weakness    Lower Extremity Assessment Lower Extremity Assessment: Generalized weakness    Cervical / Trunk Assessment Cervical / Trunk Assessment:  (rounded shoulders,  forward head)  Communication   Communication: No difficulties  Cognition Arousal/Alertness: Awake/alert Behavior During Therapy: WFL for tasks assessed/performed Overall Cognitive Status: Impaired/Different from baseline Area of Impairment: Orientation;Memory;Safety/judgement;Attention;Following commands;Awareness;Problem solving                 Orientation Level: Disoriented to;Time;Situation   Memory: Decreased recall of precautions;Decreased short-term memory Following Commands: Follows one step commands with increased time Safety/Judgement: Decreased awareness of deficits;Decreased awareness of safety Awareness: Anticipatory;Emergent;Intellectual Problem Solving: Slow processing General Comments: pt states date as Sept, 2002, unable to state situation.      General Comments General comments (skin integrity, edema, etc.): Cuing to consume small sips of liquids (pt with 2 coughing episodes after consuming thickened liquid); Pt c/o lightheadedness upon standing & ambulating around EOB, BP sitting in RUE: 141/75 mmHg (MAP 89), SpO2 >90% on room air    Exercises Other Exercises Other Exercises: 5x sit<>stand from recliner for BLE strengthening with cuing for hand placement & improved eccentric control (pt requires 3 minutes to perform 5 repetitions)    Assessment/Plan    PT Assessment Patient needs continued PT services  PT Problem List Decreased strength;Decreased mobility;Decreased safety awareness;Decreased range of motion;Decreased coordination;Decreased activity tolerance;Decreased cognition;Decreased balance;Decreased knowledge of use of DME       PT Treatment Interventions DME instruction;Therapeutic activities;Modalities;Cognitive remediation;Gait training;Therapeutic exercise;Patient/family education;Balance training;Wheelchair mobility training;Neuromuscular re-education;Functional mobility training;Manual techniques;Stair training    PT Goals (Current goals can be  found in the Care Plan section)  Acute Rehab PT Goals Patient Stated Goal: to go home PT Goal Formulation: With patient Time For Goal Achievement: 12/25/20 Potential to Achieve Goals: Good    Frequency Min 2X/week   Barriers to discharge Decreased caregiver support;Inaccessible home environment      Co-evaluation PT/OT/SLP Co-Evaluation/Treatment: Yes Reason for Co-Treatment: For patient/therapist safety PT goals addressed during session: Mobility/safety with mobility;Balance;Proper use of DME OT goals addressed during session: ADL's and self-care       AM-PAC PT "6 Clicks" Mobility  Outcome Measure Help needed turning from your back to your side while in a flat bed without using bedrails?: A Little Help needed moving from lying on your back to sitting on the side of a flat bed without using bedrails?: A Little Help needed moving to and from a bed to a chair (including a wheelchair)?: A Little Help needed standing up from a chair using your arms (e.g., wheelchair or bedside chair)?: A Little Help needed to walk in hospital room?: A Little Help needed climbing 3-5 steps with a railing? : A Lot 6 Click Score: 17    End of Session Equipment Utilized During Treatment: Gait belt Activity Tolerance: Patient tolerated treatment well Patient left: in chair;with chair alarm set;with call bell/phone within reach Nurse Communication: Mobility status PT Visit Diagnosis: Unsteadiness on feet (R26.81);Muscle weakness (generalized) (M62.81);Difficulty in walking, not elsewhere classified (R26.2)    Time: 2353-6144 PT Time Calculation (min) (ACUTE ONLY): 29 min   Charges:   PT Evaluation $PT Re-evaluation: 1 Re-eval PT Treatments $Therapeutic Activity: 8-22 mins        Lavone Nian, PT, DPT 12/11/20, 9:35 AM   [name] 12/11/2020,  9:34 AM

## 2020-12-12 DIAGNOSIS — J9601 Acute respiratory failure with hypoxia: Secondary | ICD-10-CM | POA: Diagnosis not present

## 2020-12-12 DIAGNOSIS — I10 Essential (primary) hypertension: Secondary | ICD-10-CM

## 2020-12-12 DIAGNOSIS — G9341 Metabolic encephalopathy: Secondary | ICD-10-CM | POA: Diagnosis not present

## 2020-12-12 LAB — MAGNESIUM: Magnesium: 2.3 mg/dL (ref 1.7–2.4)

## 2020-12-12 LAB — BASIC METABOLIC PANEL
Anion gap: 7 (ref 5–15)
BUN: 26 mg/dL — ABNORMAL HIGH (ref 8–23)
CO2: 27 mmol/L (ref 22–32)
Calcium: 9.9 mg/dL (ref 8.9–10.3)
Chloride: 107 mmol/L (ref 98–111)
Creatinine, Ser: 0.82 mg/dL (ref 0.44–1.00)
GFR, Estimated: >60 mL/min (ref >60)
Glucose, Bld: 76 mg/dL (ref 70–99)
Potassium: 3.7 mmol/L (ref 3.5–5.1)
Sodium: 141 mmol/L (ref 135–145)

## 2020-12-12 LAB — PHOSPHORUS: Phosphorus: 3.2 mg/dL (ref 2.5–4.6)

## 2020-12-12 MED ORDER — ENSURE ENLIVE PO LIQD
237.0000 mL | Freq: Three times a day (TID) | ORAL | Status: DC
  Administered 2020-12-12 – 2020-12-15 (×8): 237 mL via ORAL

## 2020-12-12 NOTE — Plan of Care (Signed)
  Problem: Education: Goal: Knowledge of General Education information will improve Description: Including pain rating scale, medication(s)/side effects and non-pharmacologic comfort measures 12/12/2020 1618 by Kerney Elbe, LPN Outcome: Progressing 12/12/2020 1617 by Kerney Elbe, LPN Outcome: Progressing   Problem: Health Behavior/Discharge Planning: Goal: Ability to manage health-related needs will improve 12/12/2020 1618 by Kerney Elbe, LPN Outcome: Progressing 12/12/2020 1617 by Kerney Elbe, LPN Outcome: Progressing   Problem: Clinical Measurements: Goal: Ability to maintain clinical measurements within normal limits will improve 12/12/2020 1618 by Febe Champa J, LPN Outcome: Progressing 12/12/2020 1617 by Kerney Elbe, LPN Outcome: Progressing Goal: Will remain free from infection 12/12/2020 1618 by Kerney Elbe, LPN Outcome: Progressing 12/12/2020 1617 by Kerney Elbe, LPN Outcome: Progressing Goal: Diagnostic test results will improve 12/12/2020 1618 by Kerney Elbe, LPN Outcome: Progressing 12/12/2020 1617 by Kerney Elbe, LPN Outcome: Progressing Goal: Respiratory complications will improve 12/12/2020 1618 by Kerney Elbe, LPN Outcome: Progressing 12/12/2020 1617 by Kerney Elbe, LPN Outcome: Progressing Goal: Cardiovascular complication will be avoided 12/12/2020 1618 by Kerney Elbe, LPN Outcome: Progressing 12/12/2020 1617 by Kerney Elbe, LPN Outcome: Progressing   Problem: Activity: Goal: Risk for activity intolerance will decrease Outcome: Progressing   Problem: Nutrition: Goal: Adequate nutrition will be maintained Outcome: Progressing   Problem: Coping: Goal: Level of anxiety will decrease Outcome: Progressing   Problem: Elimination: Goal: Will not experience complications related to bowel motility Outcome: Progressing Goal: Will not experience complications related to  urinary retention Outcome: Progressing   Problem: Pain Managment: Goal: General experience of comfort will improve Outcome: Progressing   Problem: Safety: Goal: Ability to remain free from injury will improve Outcome: Progressing   Problem: Skin Integrity: Goal: Risk for impaired skin integrity will decrease Outcome: Progressing

## 2020-12-12 NOTE — TOC Progression Note (Signed)
Transition of Care [facility]) - Progression Note    Patient Details  Name: Gwendolyn [name] MRN: [id number] Date of Birth: April 29, 1945  Transition of Care [facility]) CM/SW [place], RN Phone Number: 12/12/2020, 10:41 AM  Clinical Narrative:     The patient resides at [facility] independent living, I reached out to [facility] and left a secure message that the patient will need a STR SNF bed, awaiting a response from [facility]  Expected Discharge Plan: Assisted Living Barriers to Discharge: Continued Medical Work up  Expected Discharge Plan and Services Expected Discharge Plan: Assisted Living       Living arrangements for the past 2 months: [place] Expected Discharge Date: 12/09/20                                     Social Determinants of Health (SDOH) Interventions    Readmission Risk Interventions Readmission Risk Prevention Plan 12/04/2020  Post Dischage Appt Complete  Medication Screening Complete  Transportation Screening Complete  Some recent data might be hidden

## 2020-12-12 NOTE — Progress Notes (Signed)
PROGRESS NOTE    Gwendolyn [name]  SLH:[id number] DOB: 1946-02-17 DOA: 12/02/2020 PCP: Barbaraann Boys, MD   Chief complaint.  Altered mental status. Brief Narrative:  75 y.o female with significant PMH as below who presented to the ED with chief complaints of altered mental status and near syncopal episode.   patient recently moved to an assisted living at [facility].  Patient apparently has not been taking her home medication which consist of Lexapro 20 mg, Requip, Cymbalta, Lyrica, and buprenorphine for unknown period of time.  Per POA who manages her medication, today was the first time the patient took her medications.  Patient's POA states that they were walking 30 minutes after she took her medication around 330 when she suddenly complained of feeling dizzy near syncope.   Patient was able to sit down but was soon noted to be somnolent, clammy and incoherent.  POA also stated that patient's eyes appeared to be rolling backwards but did not lose consciousness entirely.  EMS was called and patient was transported to the ED.     Given concerns for possible accidental overdose, patient was intubated for airway protection and respiratory failure.  PCCM consulted for admission to ICU.  Pt was extubated on 8/19 and transferred to hospitalist service on 8/21.   Patient was not responsive after extubation, POA had made a decision to transition to comfort care.  However, patient started waking up 8/25.    Assessment & Plan:   Active Problems:   Essential hypertension, benign   Acute metabolic encephalopathy   Acute respiratory failure with hypoxia (HCC)   Hypernatremia   Thrombocytopenia (HCC)   Morbidly obese ([place])  #1.  Acute hypoxemic respiratory failure Acute metabolic encephalopathy. Patient was requiring intubation for airway protection. Condition had improved, patient no longer has any confusion other than some baseline dementia. She has been seen by PT/OT, currently  pending for nursing home placement. Patient still has poor appetite, will change the Ensure to 3 times a day.  #2.  Sialoadenitis with the left neck swelling. Condition had improved, I will discontinue antibiotics.   3.  Hypernatremia. Hypokalemia. Patient improved, recheck level tomorrow.  5.  Paroxysmal atrial fibrillation. History of CVA. Mild dementia. Stable  DVT prophylaxis: Lovenox Code Status: DNR Family Communication: Son updated at bedside Disposition Plan:    Status is: Inpatient  Remains inpatient appropriate because:Inpatient level of care appropriate due to severity of illness  Dispo: The patient is from: Home              Anticipated d/c is to: SNF              Patient currently is medically stable to d/c.   Difficult to place patient No        I/O last 3 completed shifts: In: 1987.2 [I.V.:1304.2; IV Piggyback:683] Out: 1150 [Urine:1150] No intake/output data recorded.     Consultants:  None  Procedures: None  Antimicrobials:None   Subjective: Patient doing much better, she no longer has any confusion. She still has a poor appetite, but no abdominal pain or nausea vomiting. No dysuria hematuria  Left neck swelling has resolved.  Objective: Vitals:   12/11/20 0747 12/11/20 1955 12/12/20 0553 12/12/20 0811  BP: (!) 149/59 (!) 129/54 (!) 117/54 (!) 128/58  Pulse: 76 78 60 74  Resp: _0 Temp: 98.1 F (36.7 C) 98.3 F (36.8 C) 97.8 F (36.6 C) 98.2 F (36.8 C)  TempSrc:   Oral  SpO2: 97% 96% 98% 98%  Weight:      Height:        Intake/Output Summary (Last 24 hours) at 12/12/2020 1403 Last data filed at 12/11/2020 1945 Gross per 24 hour  Intake 1987.2 ml  Output 450 ml  Net 1537.2 ml   Filed Weights   12/03/20 0429 12/04/20 0434 12/05/20 0453  Weight: 102.4 kg 98.1 kg 97.2 kg    Examination:  General exam: Appears calm and comfortable  Respiratory system: Clear to auscultation. Respiratory effort  normal. Cardiovascular system: S1 & S2 heard, RRR. No JVD, murmurs, rubs, gallops or clicks. No pedal edema. Gastrointestinal system: Abdomen is nondistended, soft and nontender. No organomegaly or masses felt. Normal bowel sounds heard. Central nervous system: Alert and oriented x2. No focal neurological deficits. Extremities: Symmetric 5 x 5 power. Skin: No rashes, lesions or ulcers Psychiatry: Judgement and insight appear normal. Mood & affect appropriate.     Data Reviewed: I have personally reviewed following labs and imaging studies  CBC: Recent Labs  Lab 12/06/20 0032 12/07/20 0546 12/10/20 0712  WBC 4.1 3.6* 6.9  NEUTROABS  --   --  5.3  HGB 11.7* 12.2 12.4  HCT 37.0 37.2 39.3  MCV 94.9 95.4 97.0  PLT 135* 135* 458*   Basic Metabolic Panel: Recent Labs  Lab 12/06/20 0032 12/07/20 0546 12/10/20 0712 12/11/20 0504 12/12/20 0501  NA 145 150* 147* 146* 141  K 3.9 3.4* 3.5 3.2* 3.7  CL 114* 111 110 110 107  CO2 _0 32 27  GLUCOSE 120* 121* 97 116* 76  BUN 32* 29* 25* 27* 26*  CREATININE 0.82 0.78 0.75 0.85 0.82  CALCIUM 10.8* 10.8* 9.7 10.2 9.9  MG 2.2 2.1 2.1 2.1 2.3  PHOS  --   --  3.2 3.3 3.2   GFR: Estimated Creatinine Clearance: 68.8 mL/min (by C-G formula based on SCr of 0.82 mg/dL). Liver Function Tests: Recent Labs  Lab 12/06/20 0032  AST 73*  ALT 36  ALKPHOS 79  BILITOT 1.1  PROT 6.1*  ALBUMIN 2.7*   No results for input(s): LIPASE, AMYLASE in the last 168 hours. No results for input(s): AMMONIA in the last 168 hours. Coagulation Profile: No results for input(s): INR, PROTIME in the last 168 hours. Cardiac Enzymes: No results for input(s): CKTOTAL, CKMB, CKMBINDEX, TROPONINI in the last 168 hours. BNP (last 3 results) No results for input(s): PROBNP in the last 8760 hours. HbA1C: No results for input(s): HGBA1C in the last 72 hours. CBG: Recent Labs  Lab 12/06/20 2015 12/06/20 2312 12/07/20 0340 12/07/20 0712 12/07/20 1137   GLUCAP 105* 119* 104* 106* 132*   Lipid Profile: No results for input(s): CHOL, HDL, LDLCALC, TRIG, CHOLHDL, LDLDIRECT in the last 72 hours. Thyroid Function Tests: No results for input(s): TSH, T4TOTAL, FREET4, T3FREE, THYROIDAB in the last 72 hours. Anemia Panel: No results for input(s): VITAMINB12, FOLATE, FERRITIN, TIBC, IRON, RETICCTPCT in the last 72 hours. Sepsis Labs: No results for input(s): PROCALCITON, LATICACIDVEN in the last 168 hours.  Recent Results (from the past 240 hour(s))  Resp Panel by RT-PCR (Flu A&B, Covid) Nasopharyngeal Swab     Status: None   Collection Time: 12/02/20  5:55 PM   Specimen: Nasopharyngeal Swab; Nasopharyngeal(NP) swabs in vial transport medium  Result Value Ref Range Status   SARS Coronavirus 2 by RT PCR NEGATIVE NEGATIVE Final    Comment: (NOTE) SARS-CoV-2 target nucleic acids are NOT DETECTED.  The SARS-CoV-2 RNA is generally detectable  in upper respiratory specimens during the acute phase of infection. The lowest concentration of SARS-CoV-2 viral copies this assay can detect is 138 copies/mL. A negative result does not preclude SARS-Cov-2 infection and should not be used as the sole basis for treatment or other patient management decisions. A negative result may occur with  improper specimen collection/handling, submission of specimen other than nasopharyngeal swab, presence of viral mutation(s) within the areas targeted by this assay, and inadequate number of viral copies(<138 copies/mL). A negative result must be combined with clinical observations, patient history, and epidemiological information. The expected result is Negative.  Fact Sheet for Patients:  EntrepreneurPulse.com.au  Fact Sheet for Healthcare Providers:  IncredibleEmployment.be  This test is no t yet approved or cleared by the [place] FDA and  has been authorized for detection and/or diagnosis of SARS-CoV-2 by FDA under an  Emergency Use Authorization (EUA). This EUA will remain  in effect (meaning this test can be used) for the duration of the COVID-19 declaration under Section 564(b)(1) of the Act, 21 U.S.C.section 360bbb-3(b)(1), unless the authorization is terminated  or revoked sooner.       Influenza A by PCR NEGATIVE NEGATIVE Final   Influenza B by PCR NEGATIVE NEGATIVE Final    Comment: (NOTE) The Xpert Xpress SARS-CoV-2/FLU/RSV plus assay is intended as an aid in the diagnosis of influenza from Nasopharyngeal swab specimens and should not be used as a sole basis for treatment. Nasal washings and aspirates are unacceptable for Xpert Xpress SARS-CoV-2/FLU/RSV testing.  Fact Sheet for Patients: EntrepreneurPulse.com.au  Fact Sheet for Healthcare Providers: IncredibleEmployment.be  This test is not yet approved or cleared by the [place] FDA and has been authorized for detection and/or diagnosis of SARS-CoV-2 by FDA under an Emergency Use Authorization (EUA). This EUA will remain in effect (meaning this test can be used) for the duration of the COVID-19 declaration under Section 564(b)(1) of the Act, 21 U.S.C. section 360bbb-3(b)(1), unless the authorization is terminated or revoked.  Performed at [facility], [address] [place], [place] [address]   MRSA Next Gen by PCR, Nasal     Status: None   Collection Time: 12/02/20  9:39 PM   Specimen: Nasal Mucosa; Nasal Swab  Result Value Ref Range Status   MRSA by PCR Next Gen NOT DETECTED NOT DETECTED Final    Comment: (NOTE) The GeneXpert MRSA Assay (FDA approved for NASAL specimens only), is one component of a comprehensive MRSA colonization surveillance program. It is not intended to diagnose MRSA infection nor to guide or monitor treatment for MRSA infections. Test performance is not FDA approved in patients less than 42 years old. Performed at [facility], [address], [place], [place] [address]          Radiology Studies: No results found.      Scheduled Meds:  amoxicillin-clavulanate  1 tablet Oral Q12H   chlorhexidine gluconate (MEDLINE KIT)  15 mL Mouth Rinse BID   Chlorhexidine Gluconate Cloth  6 each Topical Q0600   enoxaparin (LOVENOX) injection  50 mg Subcutaneous Q24H   feeding supplement  237 mL Oral TID BM   levothyroxine  75 mcg Oral Q0600   pantoprazole  40 mg Oral Daily   Continuous Infusions:  sodium chloride Stopped (12/03/20 0427)     LOS: 10 days    Time spent: 28 minutes    Sharen Hones, MD [facility]   To contact the attending provider between 7A-7P or the covering provider during  after hours 7P-7A, please log into the web site [email or web address] and access using universal [place] password for that web site. If you do not have the password, please call the hospital operator.  12/12/2020, 2:03 PM

## 2020-12-13 ENCOUNTER — Other Ambulatory Visit: Payer: Self-pay

## 2020-12-13 DIAGNOSIS — G9341 Metabolic encephalopathy: Secondary | ICD-10-CM | POA: Diagnosis not present

## 2020-12-13 DIAGNOSIS — E870 Hyperosmolality and hypernatremia: Secondary | ICD-10-CM | POA: Diagnosis not present

## 2020-12-13 DIAGNOSIS — J9601 Acute respiratory failure with hypoxia: Secondary | ICD-10-CM | POA: Diagnosis not present

## 2020-12-13 LAB — BASIC METABOLIC PANEL
Anion gap: 6 (ref 5–15)
BUN: 27 mg/dL — ABNORMAL HIGH (ref 8–23)
CO2: 28 mmol/L (ref 22–32)
Calcium: 9.5 mg/dL (ref 8.9–10.3)
Chloride: 103 mmol/L (ref 98–111)
Creatinine, Ser: 0.85 mg/dL (ref 0.44–1.00)
GFR, Estimated: >60 mL/min (ref >60)
Glucose, Bld: 97 mg/dL (ref 70–99)
Potassium: 3.8 mmol/L (ref 3.5–5.1)
Sodium: 137 mmol/L (ref 135–145)

## 2020-12-13 LAB — PHOSPHORUS: Phosphorus: 3.4 mg/dL (ref 2.5–4.6)

## 2020-12-13 LAB — MAGNESIUM: Magnesium: 2.1 mg/dL (ref 1.7–2.4)

## 2020-12-13 NOTE — Progress Notes (Signed)
Speech Language Pathology Treatment: Dysphagia  Patient Details Name: Gwendolyn [name] MRN: [id number] DOB: 19-Oct-1945 Today's Date: 12/13/2020 Time: 1320-1400 SLP Time Calculation (min) (ACUTE ONLY): 40 min  Assessment / Plan / Recommendation Clinical Impression  Pt seen for ongoing assessment of swallowing; trials to upgrade diet consistency if possible. Pt is alert, more attentive during tasks. She is verbally responsive and engaged in conversation w/ SLP; on RA; wbc wnl. Native Dentition. Pt explained general aspiration precautions and agreed verbally to the need for following them especially sitting upright for all oral intake -- supported behind the lower back sitting in chair. Pt accepted general cues readily. She fed herself trials of thin liquids via Cup/Straw then graham crackers w/ ice cream; soft solids and thin liquids. No consistent, overt clinical s/s of aspiration were noted w/ trials -- 1 mild throat clear noted post multiple sips. Education given on taking Single sips to lessen risk for coughing/aspiration. Respiratory status remained calm and unlabored, vocal quality clear b/t trials. Oral phase appeared grossly [facility] for bolus management, mastication, and timely A-P transfer for swallowing w/ trials of thin liquids and solids; oral clearing achieved w/ all consistencies. NSG denied any deficits in swallowing as well. Noted she had been drinking ice water in room -- pt denied any difficulty swallowing or coughing/choking.   Pt appears at reduced risk for aspriation when following general aspiration precautions. Recommend a Mech soft diet for ease of soft foods w/ gravies added to moisten foods; Thin liquids - monitor any straw use. Recommend general aspiration precautions; Pills Whole in Puree for cohesion and safer swallowing; tray setup and positioning assistance for meals. Education given on general aspiration precautions; Sitting Upright; Single sips -- monitor straw use and discard  if coughing noted when using a straw to drink w/. Precautions posted in room. ST services will sign off at this time w/ MD to reconsult if new needs while admitted. NSG updated.      HPI HPI: Pt  is a 75 y.o. femalewho presents to the ED for evaluation of altered mentation and somnolence.   Chart review indicates history of MULTIPLE medical dxs including anxiety/depression, GERD, fibromyalgia, IBD, NASH, CVA, depression, Cognitive Impairment, leg swelling, chronic lumbar radiculopathy and osteoarthritis and Suboxone.  Pt recently moved in w/ a friend who noted pt has not been taking her Medications as prescribed.  Pt was intubated for airway protection in the setting of toxic-metabolic encephalopathy after apparent unintentional multidrug overdose of multiple prescription medications at admit; she was successfully extubated on 8/19, she remains encephalopathic.  Palliative Care consult for [place].  Pt began arousing on 12/09/2020 and consult placed for BSE.  Of note, pt has a Left submandibular gland sialoadenitis with no abscess or stone per ENT.      SLP Plan  All goals met       Recommendations  Diet recommendations: Dysphagia 3 (mechanical soft);Thin liquid Liquids provided via: Cup;Straw (monitor straw use) Medication Administration: Whole meds with puree Supervision: Patient able to self feed;Intermittent supervision to cue for compensatory strategies (tray setup) Compensations: Minimize environmental distractions;Slow rate;Small sips/bites;Lingual sweep for clearance of pocketing;Follow solids with liquid Postural Changes and/or Swallow Maneuvers: Out of bed for meals;Seated upright 90 degrees;Upright 30-60 min after meal                General recommendations:  (Dietician f/u) Oral Care Recommendations: Oral care BID;Oral care before and after PO;Staff/trained caregiver to provide oral care Follow up Recommendations: Skilled Nursing facility SLP Visit  Diagnosis: Dysphagia,  unspecified (R13.10) Plan: All goals met       GO                  Gwendolyn Kenner, MS, CCC-SLP Speech Language Pathologist Rehab Services [phone]  [facility] [address] 12/13/2020, 2:00 PM

## 2020-12-13 NOTE — NC FL2 (Signed)
[place] LEVEL OF CARE SCREENING TOOL     IDENTIFICATION  Patient Name: Gwendolyn [name] Birthdate: Oct 17, 1945 Sex: female Admission Date (Current Location): 12/02/2020  [facility] and [place] Number:  Engineering geologist and Address:  [facility], [address], [place],  [address]      Provider Number: [id number]  Attending Physician Name and Address:  Sharen Hones, MD  Relative Name and Phone Number:  Albertine Patricia [phone]    Current Level of Care: Hospital Recommended Level of Care: [place] Prior Approval Number:    Date Approved/Denied:   PASRR Number: Pending  Discharge Plan: SNF    Current Diagnoses: Patient Active Problem List   Diagnosis Date Noted   Morbidly obese ([place]) 12/11/2020   Hypernatremia 12/10/2020   Thrombocytopenia ([place]) 12/10/2020   Acute respiratory failure with hypoxia ([place]) 78/24/2353   Acute metabolic encephalopathy 61/44/3154   Cirrhosis of liver ([place])    Cerebrovascular disease    ARF (acute renal failure) ([place]) 01/09/2018   Chronic venous insufficiency 11/28/2017   Lymphedema 11/28/2017   Leg pain 11/28/2017   GERD (gastroesophageal reflux disease) 11/28/2017   Unspecified hypothyroidism 12/10/2013   Medicare annual wellness visit, subsequent 07/08/2013   Weakness of both legs 07/08/2013   Night sweats 01/16/2013   Tremor 01/16/2013   Palpitations 01/16/2013   Fibromyalgia 07/22/2012   Essential hypertension, benign 06/19/2012   Bereavement 06/19/2012   Depression 03/28/2012   Insomnia 03/28/2012   Lumbar back pain 12/22/2011   HX: breast cancer 09/18/2011   Abnormal CT lung screening 08/07/2011   NASH (nonalcoholic steatohepatitis) 06/12/2011    Orientation RESPIRATION BLADDER Height & Weight     Self  Normal External catheter Weight: 97.2 kg Height:  5' 3" (160 cm)  BEHAVIORAL SYMPTOMS/MOOD NEUROLOGICAL BOWEL NUTRITION STATUS      Continent Diet (dysphagia  3)  AMBULATORY STATUS COMMUNICATION OF NEEDS Skin   Extensive Assist Verbally Normal                       Personal Care Assistance Level of Assistance  Bathing, Feeding, Dressing Bathing Assistance: Maximum assistance Feeding assistance: Maximum assistance Dressing Assistance: Maximum assistance     Functional Limitations Info             SPECIAL CARE FACTORS FREQUENCY  PT (By licensed PT), OT (By licensed OT)     PT Frequency: 5 times per week OT Frequency: 5 times per week            Contractures Contractures Info: Not present    Additional Factors Info  Code Status, Allergies Code Status Info: DNR Allergies Info: Codeine, Oxycodone, Silver           Current Medications (12/13/2020):  This is the current hospital active medication list Current Facility-Administered Medications  Medication Dose Route Frequency Provider Last Rate Last Admin   0.9 %  sodium chloride infusion  250 mL Intravenous Continuous Lang Snow, NP   Held at 12/03/20 0427   chlorhexidine gluconate (MEDLINE KIT) (PERIDEX) 0.12 % solution 15 mL  15 mL Mouth Rinse BID Flora Lipps, MD   15 mL at 12/13/20 0854   enoxaparin (LOVENOX) injection 50 mg  50 mg Subcutaneous Q24H Sharen Hones, MD   50 mg at 12/12/20 2200   feeding supplement (ENSURE ENLIVE / ENSURE PLUS) liquid 237 mL  237 mL Oral TID BM Sharen Hones, MD   237 mL at 12/13/20 [phone]  HYDROcodone-acetaminophen (NORCO/VICODIN) 5-325 MG per tablet 1 tablet  1 tablet Oral Q6H PRN Sharen Hones, MD   1 tablet at 12/10/20 2124   levothyroxine (SYNTHROID) tablet 75 mcg  75 mcg Oral Q0600 Sharen Hones, MD   75 mcg at 12/13/20 5009   magic mouthwash  10 mL Oral TID PRN Sharen Hones, MD       pantoprazole (PROTONIX) EC tablet 40 mg  40 mg Oral Daily Sharen Hones, MD   40 mg at 12/13/20 3818   sodium chloride flush (NS) 0.9 % injection 10-40 mL  10-40 mL Intracatheter PRN Sharen Hones, MD         Discharge Medications: Please see  discharge summary for a list of discharge medications.  Relevant Imaging Results:  Relevant Lab Results:   Additional Information SS #: [id number] [place], RN

## 2020-12-13 NOTE — TOC Progression Note (Signed)
Transition of Care [facility]) - Progression Note    Patient Details  Name: Gwendolyn [name] MRN: [id number] Date of Birth: [birth year]  Transition of Care [facility]) CM/SW Chelsea, RN Phone Number: 12/13/2020, 11:27 AM  Clinical Narrative:    Spoke with [place], she stated that [facility]" is the secondary POA and not to have any say unless Ivin Booty is not available.  She stated that the patient is not to go to [facility], she would like the patient to got to STR and then go to a long term facility in [place], I explained I would be able to locate a facility for STR but they would work on a long term facility once she was at the [facility] facility.She would need to work with the [place] SW to do that, she took my number and will call me back about a facility locally   Expected Discharge Plan: Assisted Living Barriers to Discharge: Continued Medical Work up  Expected Discharge Plan and Services Expected Discharge Plan: Assisted Living       Living arrangements for the past 2 months: [place] Expected Discharge Date: 12/09/20                                     Social Determinants of Health (SDOH) Interventions    Readmission Risk Interventions Readmission Risk Prevention Plan 12/04/2020  Post Dischage Appt Complete  Medication Screening Complete  Transportation Screening Complete  Some recent data might be hidden

## 2020-12-13 NOTE — Progress Notes (Signed)
The above mentioned patient needs Short Term rehabilitation for strengthening, It is anticipated to be under 30 days and the plan is to go back to independent living afterwards,

## 2020-12-13 NOTE — Care Management Important Message (Signed)
Important Message  Patient Details  Name: Gwendolyn [name] MRN: [id number] Date of Birth: Oct 29, 1945   Medicare Important Message Given:  Yes     Dannette Barbara 12/13/2020, 11:52 AM

## 2020-12-13 NOTE — TOC Progression Note (Signed)
Transition of Care [facility]) - Progression Note    Patient Details  Name: Gwendolyn [name] MRN: [id number] Date of Birth: 1946/01/04  Transition of Care [facility]) CM/SW Tina, RN Phone Number: 12/13/2020, 4:25 PM  Clinical Narrative:    Uploaded requested clinical to PASSR [place] Must, still waiting on Bed offer, Spoke to [place] and she is aware of the updates.    Expected Discharge Plan: Assisted Living Barriers to Discharge: Continued Medical Work up  Expected Discharge Plan and Services Expected Discharge Plan: Assisted Living       Living arrangements for the past 2 months: [place] Expected Discharge Date: 12/09/20                                     Social Determinants of Health (SDOH) Interventions    Readmission Risk Interventions Readmission Risk Prevention Plan 12/04/2020  Post Dischage Appt Complete  Medication Screening Complete  Transportation Screening Complete  Some recent data might be hidden

## 2020-12-13 NOTE — Progress Notes (Signed)
PROGRESS NOTE    Gwendolyn [name]  WJX:[id number] DOB: 17-Aug-1945 DOA: 12/02/2020 PCP: Barbaraann Boys, MD   Chief complaint.  Altered mental status. Brief Narrative:  75 y.o female with significant PMH as below who presented to the ED with chief complaints of altered mental status and near syncopal episode.   patient recently moved to an assisted living at [place].  Patient apparently has not been taking her home medication which consist of Lexapro 20 mg, Requip, Cymbalta, Lyrica, and buprenorphine for unknown period of time.  Per POA who manages her medication, today was the first time the patient took her medications.  Patient's POA states that they were walking 30 minutes after she took her medication around 330 when she suddenly complained of feeling dizzy near syncope.   Patient was able to sit down but was soon noted to be somnolent, clammy and incoherent.  POA also stated that patient's eyes appeared to be rolling backwards but did not lose consciousness entirely.  EMS was called and patient was transported to the ED.     Given concerns for possible accidental overdose, patient was intubated for airway protection and respiratory failure.  PCCM consulted for admission to ICU.  Pt was extubated on 8/19 and transferred to hospitalist service on 8/21.   Patient was not responsive after extubation, POA had made a decision to transition to comfort care.  However, patient started waking up 8/25.  Patient condition continued to improve, eating better, able to ambulate with physical therapy.  Currently pending nursing home placement.   Assessment & Plan:   Active Problems:   Essential hypertension, benign   Acute metabolic encephalopathy   Acute respiratory failure with hypoxia (HCC)   Hypernatremia   Thrombocytopenia (HCC)   Morbidly obese ([place])  #1.  Acute hypoxemic respiratory failure Acute metabolic encephalopathy. Patient was requiring intubation for airway  protection.  Patient doing much better with speech therapy, diet upgraded.  Patient also walking with physical therapy. Pending nursing home placement.  2.  Sialoadenitis with left neck swelling. Condition resolved.  3.  Hypernatremia. Hypokalemia. Condition improved.  5.  Paroxysmal atrial fibrillation. History of CVA. Mild dementia. Continue to follow.  DVT prophylaxis: Lovenox Code Status: DNR Family Communication:  Disposition Plan:    Status is: Inpatient  Remains inpatient appropriate because:Unsafe d/c plan  Dispo: The patient is from: Home              Anticipated d/c is to: SNF              Patient currently is medically stable to d/c.   Difficult to place patient No        I/O last 3 completed shifts: In: 480 [P.O.:480] Out: 200 [Urine:200] No intake/output data recorded.     Consultants:  None  Procedures: None  Antimicrobials: None   Subjective: Patient continues to make progress, she no longer has significant confusion.  She is tolerating diet, speech therapy have upgraded the patient diet. She denies any short of breath or cough. No abdominal pain or nausea vomiting. No fever or chills.  Objective: Vitals:   12/12/20 1537 12/12/20 2300 12/13/20 0218 12/13/20 0836  BP: (!) 115/54 (!) 119/58 (!) 108/43 111/61  Pulse: 83 82 79 76  Resp: 18 18 (!) 21 16  Temp: 98.1 F (36.7 C) 98.3 F (36.8 C) 98.5 F (36.9 C) 97.9 F (36.6 C)  TempSrc:  Oral Oral   SpO2: 96% 95% 96% 99%  Weight:  Height:        Intake/Output Summary (Last 24 hours) at 12/13/2020 1334 Last data filed at 12/13/2020 1027 Gross per 24 hour  Intake 480 ml  Output --  Net 480 ml   Filed Weights   12/03/20 0429 12/04/20 0434 12/05/20 0453  Weight: 102.4 kg 98.1 kg 97.2 kg    Examination:  General exam: Appears calm and comfortable  Respiratory system: Clear to auscultation. Respiratory effort normal. Cardiovascular system: S1 & S2 heard, RRR. No JVD,  murmurs, rubs, gallops or clicks. No pedal edema. Gastrointestinal system: Abdomen is nondistended, soft and nontender. No organomegaly or masses felt. Normal bowel sounds heard. Central nervous system: Alert and oriented x3. No focal neurological deficits. Extremities: Symmetric 5 x 5 power. Skin: No rashes, lesions or ulcers Psychiatry: Judgement and insight appear normal. Mood & affect appropriate.     Data Reviewed: I have personally reviewed following labs and imaging studies  CBC: Recent Labs  Lab 12/07/20 0546 12/10/20 0712  WBC 3.6* 6.9  NEUTROABS  --  5.3  HGB 12.2 12.4  HCT 37.2 39.3  MCV 95.4 97.0  PLT 135* 979*   Basic Metabolic Panel: Recent Labs  Lab 12/07/20 0546 12/10/20 0712 12/11/20 0504 12/12/20 0501 12/13/20 0442  NA 150* 147* 146* 141 137  K 3.4* 3.5 3.2* 3.7 3.8  CL 111 110 110 107 103  CO2 30 30 32 27 28  GLUCOSE 121* 97 116* 76 97  BUN 29* 25* 27* 26* 27*  CREATININE 0.78 0.75 0.85 0.82 0.85  CALCIUM 10.8* 9.7 10.2 9.9 9.5  MG 2.1 2.1 2.1 2.3 2.1  PHOS  --  3.2 3.3 3.2 3.4   GFR: Estimated Creatinine Clearance: 66.4 mL/min (by C-G formula based on SCr of 0.85 mg/dL). Liver Function Tests: No results for input(s): AST, ALT, ALKPHOS, BILITOT, PROT, ALBUMIN in the last 168 hours. No results for input(s): LIPASE, AMYLASE in the last 168 hours. No results for input(s): AMMONIA in the last 168 hours. Coagulation Profile: No results for input(s): INR, PROTIME in the last 168 hours. Cardiac Enzymes: No results for input(s): CKTOTAL, CKMB, CKMBINDEX, TROPONINI in the last 168 hours. BNP (last 3 results) No results for input(s): PROBNP in the last 8760 hours. HbA1C: No results for input(s): HGBA1C in the last 72 hours. CBG: Recent Labs  Lab 12/06/20 2015 12/06/20 2312 12/07/20 0340 12/07/20 0712 12/07/20 1137  GLUCAP 105* 119* 104* 106* 132*   Lipid Profile: No results for input(s): CHOL, HDL, LDLCALC, TRIG, CHOLHDL, LDLDIRECT in the  last 72 hours. Thyroid Function Tests: No results for input(s): TSH, T4TOTAL, FREET4, T3FREE, THYROIDAB in the last 72 hours. Anemia Panel: No results for input(s): VITAMINB12, FOLATE, FERRITIN, TIBC, IRON, RETICCTPCT in the last 72 hours. Sepsis Labs: No results for input(s): PROCALCITON, LATICACIDVEN in the last 168 hours.  No results found for this or any previous visit (from the past 240 hour(s)).       Radiology Studies: No results found.      Scheduled Meds:  chlorhexidine gluconate (MEDLINE KIT)  15 mL Mouth Rinse BID   enoxaparin (LOVENOX) injection  50 mg Subcutaneous Q24H   feeding supplement  237 mL Oral TID BM   levothyroxine  75 mcg Oral Q0600   pantoprazole  40 mg Oral Daily   Continuous Infusions:  sodium chloride Stopped (12/03/20 0427)     LOS: 11 days    Time spent: 27 minutes    Sharen Hones, MD [facility]   To  contact the attending provider between 7A-7P or the covering provider during after hours 7P-7A, please log into the web site [email or web address] and access using universal [place] password for that web site. If you do not have the password, please call the hospital operator.  12/13/2020, 1:34 PM

## 2020-12-13 NOTE — Progress Notes (Signed)
Physical Therapy Treatment Patient Details Name: Gwendolyn [name] MRN: [id number] DOB: 15-Sep-1945 Today's Date: 12/13/2020    History of Present Illness Pt is a 75 y/o F admitted on 12/02/20 with c/c of AMS & near syncopal episode. Pt admitted for tx of possible accidental drug overdose. Chest x-ray revealed small L pleural effusion. Pt was minimally responsive & transitioned to comfort care on 12/08/20, however, 2 hours prior to transport to [place] pt demonstrated increased alertness & drinking PO fluids. Hospice Home transfer was cancelled. Pt noted to have mass on neck that was determined to be an enlarged submandibular gland.  PMH: anxiety, arthritis, asthma, breast CA (01/2010), chronic LBP, liver cirrhosis, depression, fibromyalgia, GERD, HTN, HLD, IBS, migraines, NASH, RLS, trigeminal neuralgia    PT Comments    Pt seen for PT tx with pt requiring encouragement for OOB activity & to sit in recliner at end of session. Pt is able to complete supine>sit, sit>stand & gait with RW with close supervision. Pt demonstrates decreased gait speed but improving endurance/activity tolerance as she was able to ambulate 110 ft today. Due to pt living alone & continuing to demonstrate impaired cognition & functional mobility, continue to recommend STR upon d/c to maximize independence with functional mobility & reduce fall risk prior to return home alone.     Follow Up Recommendations  SNF;Supervision/Assistance - 24 hour     Equipment Recommendations  None recommended by PT (TBD in next venue)    Recommendations for Other Services       Precautions / Restrictions Precautions Precautions: Fall Restrictions Weight Bearing Restrictions: No    Mobility  Bed Mobility Overal bed mobility: Needs Assistance Bed Mobility: Supine to Sit     Supine to sit: Supervision;HOB elevated     General bed mobility comments: extra time for supine>sit    Transfers Overall transfer level: Needs  assistance Equipment used: Rolling walker (2 wheeled) Transfers: Sit to/from Stand Sit to Stand: Supervision         General transfer comment: max cuing for hand placement to push sit>stand vs pulling up on RW  Ambulation/Gait Ambulation/Gait assistance: Supervision Gait Distance (Feet): 110 Feet Assistive device: Rolling walker (2 wheeled) Gait Pattern/deviations: Decreased step length - right;Decreased step length - left;Decreased stride length Gait velocity: decreased   General Gait Details: Pt able to self correct to ambulate in more linear pattern to avoid hitting desk.   Stairs             Wheelchair Mobility    Modified Rankin (Stroke Patients Only)       Balance Overall balance assessment: Needs assistance Sitting-balance support: No upper extremity supported;Feet supported Sitting balance-Leahy Scale: Good Sitting balance - Comments: supervision sitting EOB   Standing balance support: During functional activity;Bilateral upper extremity supported Standing balance-Leahy Scale: Fair Standing balance comment: BUE support on RW                            Cognition Arousal/Alertness: Awake/alert Behavior During Therapy: WFL for tasks assessed/performed Overall Cognitive Status: Impaired/Different from baseline Area of Impairment: Orientation;Memory;Safety/judgement;Attention;Following commands;Awareness;Problem solving                 Orientation Level: Person;Place (oriented to month but reports year is 2002)   Memory: Decreased recall of precautions;Decreased short-term memory Following Commands: Follows one step commands with increased time Safety/Judgement: Decreased awareness of deficits;Decreased awareness of safety Awareness: Anticipatory;Emergent;Intellectual Problem Solving: Slow processing General Comments:  Requires encouragement to participate & sit up in recliner for lunch. Pt does endorse already eating lunch with PT  educating her that was breakfast.      Exercises      General Comments        Pertinent Vitals/Pain Pain Assessment: Faces Faces Pain Scale: No hurt    Home Living                      Prior Function            PT Goals (current goals can now be found in the care plan section) Acute Rehab PT Goals Patient Stated Goal: to go home PT Goal Formulation: With patient Time For Goal Achievement: 12/25/20 Potential to Achieve Goals: Good Progress towards PT goals: Progressing toward goals    Frequency    Min 2X/week      PT Plan Current plan remains appropriate    Co-evaluation              AM-PAC PT "6 Clicks" Mobility   Outcome Measure  Help needed turning from your back to your side while in a flat bed without using bedrails?: None Help needed moving from lying on your back to sitting on the side of a flat bed without using bedrails?: A Little Help needed moving to and from a bed to a chair (including a wheelchair)?: A Little Help needed standing up from a chair using your arms (e.g., wheelchair or bedside chair)?: A Little Help needed to walk in hospital room?: A Little Help needed climbing 3-5 steps with a railing? : A Lot 6 Click Score: 18    End of Session   Activity Tolerance: Patient tolerated treatment well Patient left: in chair;with chair alarm set;with call bell/phone within reach   PT Visit Diagnosis: Unsteadiness on feet (R26.81);Muscle weakness (generalized) (M62.81);Difficulty in walking, not elsewhere classified (R26.2)     Time: 0786-7544 PT Time Calculation (min) (ACUTE ONLY): 12 min  Charges:  $Therapeutic Activity: 8-22 mins                     Gwendolyn [name], PT, DPT 12/13/20, 12:09 PM    Gwendolyn [name] 12/13/2020, 12:08 PM

## 2020-12-14 DIAGNOSIS — G9341 Metabolic encephalopathy: Secondary | ICD-10-CM | POA: Diagnosis not present

## 2020-12-14 DIAGNOSIS — J9601 Acute respiratory failure with hypoxia: Secondary | ICD-10-CM | POA: Diagnosis not present

## 2020-12-14 DIAGNOSIS — I10 Essential (primary) hypertension: Secondary | ICD-10-CM | POA: Diagnosis not present

## 2020-12-14 NOTE — Progress Notes (Signed)
PROGRESS NOTE    Gwendolyn [name]  OYD:[id number] DOB: [birth year] DOA: 12/02/2020 PCP: Barbaraann Boys, MD    Brief Narrative:  75 y.o female with significant PMH as below who presented to the ED with chief complaints of altered mental status and near syncopal episode.   patient recently moved to an assisted living at [place].  Patient apparently has not been taking her home medication which consist of Lexapro 20 mg, Requip, Cymbalta, Lyrica, and buprenorphine for unknown period of time.  Per POA who manages her medication, today was the first time the patient took her medications.  Patient's POA states that they were walking 30 minutes after she took her medication around 330 when she suddenly complained of feeling dizzy near syncope.   Patient was able to sit down but was soon noted to be somnolent, clammy and incoherent.  POA also stated that patient's eyes appeared to be rolling backwards but did not lose consciousness entirely.  EMS was called and patient was transported to the ED.     Given concerns for possible accidental overdose, patient was intubated for airway protection and respiratory failure.  PCCM consulted for admission to ICU.  Pt was extubated on 8/19 and transferred to hospitalist service on 8/21.   Patient was not responsive after extubation, POA had made a decision to transition to comfort care.  However, patient started waking up 8/25.  Patient condition continued to improve, eating better, able to ambulate with physical therapy.  Currently pending nursing home placement.   Assessment & Plan:   Active Problems:   Essential hypertension, benign   Acute metabolic encephalopathy   Acute respiratory failure with hypoxia (HCC)   Hypernatremia   Thrombocytopenia (HCC)   Morbidly obese ([place])  #1.  Acute hypoxemic respiratory failure Acute metabolic encephalopathy. Patient was requiring intubation for airway protection. Patient condition has improved.  2.   Sialoadenitis with left neck swelling. Condition has improved after taking antibiotics and steroids and fluids.   3.  Hypernatremia. Hypokalemia. Improved.   5.  Paroxysmal atrial fibrillation. History of CVA. Mild dementia. Not chronic on anticoagulation.  Patient has a bed in nursing home tomorrow.  Can transfer.   DVT prophylaxis: Lovenox Code Status: DNR Family Communication:  Disposition Plan:    Status is: Inpatient  Remains inpatient appropriate because:Unsafe d/c plan  Dispo: The patient is from: Home              Anticipated d/c is to: SNF              Patient currently is medically stable to d/c.   Difficult to place patient No        I/O last 3 completed shifts: In: 240 [P.O.:240] Out: -  No intake/output data recorded.     Consultants:  ENT, ICU  Procedures: None  Antimicrobials: None  Subjective: Patient feeling much better.  Appetite improving, no nausea vomiting abdominal pain.  No diarrhea constipation. No fever or chills. No dysuria hematuria. No short of breath or cough.  Objective: Vitals:   12/13/20 2031 12/14/20 0613 12/14/20 0735 12/14/20 1548  BP: 135/63 (!) 121/59 139/78 125/66  Pulse: 89 68 83 90  Resp: _0 Temp: 98.2 F (36.8 C) 97.9 F (36.6 C) 98.1 F (36.7 C) 98 F (36.7 C)  TempSrc:      SpO2: 98% 99% 100% 98%  Weight:      Height:       No intake or output  data in the 24 hours ending 12/14/20 1612 Filed Weights   12/03/20 0429 12/04/20 0434 12/05/20 0453  Weight: 102.4 kg 98.1 kg 97.2 kg    Examination:  General exam: Appears calm and comfortable  Respiratory system: Clear to auscultation. Respiratory effort normal. Cardiovascular system: S1 & S2 heard, RRR. No JVD, murmurs, rubs, gallops or clicks. No pedal edema. Gastrointestinal system: Abdomen is nondistended, soft and nontender. No organomegaly or masses felt. Normal bowel sounds heard. Central nervous system: Alert and oriented x3. No  focal neurological deficits. Extremities: Symmetric 5 x 5 power. Skin: No rashes, lesions or ulcers Psychiatry: Judgement and insight appear normal. Mood & affect appropriate.     Data Reviewed: I have personally reviewed following labs and imaging studies  CBC: Recent Labs  Lab 12/10/20 0712  WBC 6.9  NEUTROABS 5.3  HGB 12.4  HCT 39.3  MCV 97.0  PLT 633*   Basic Metabolic Panel: Recent Labs  Lab 12/10/20 0712 12/11/20 0504 12/12/20 0501 12/13/20 0442  NA 147* 146* 141 137  K 3.5 3.2* 3.7 3.8  CL 110 110 107 103  CO2 30 32 27 28  GLUCOSE 97 116* 76 97  BUN 25* 27* 26* 27*  CREATININE 0.75 0.85 0.82 0.85  CALCIUM 9.7 10.2 9.9 9.5  MG 2.1 2.1 2.3 2.1  PHOS 3.2 3.3 3.2 3.4   GFR: Estimated Creatinine Clearance: 66.4 mL/min (by C-G formula based on SCr of 0.85 mg/dL). Liver Function Tests: No results for input(s): AST, ALT, ALKPHOS, BILITOT, PROT, ALBUMIN in the last 168 hours. No results for input(s): LIPASE, AMYLASE in the last 168 hours. No results for input(s): AMMONIA in the last 168 hours. Coagulation Profile: No results for input(s): INR, PROTIME in the last 168 hours. Cardiac Enzymes: No results for input(s): CKTOTAL, CKMB, CKMBINDEX, TROPONINI in the last 168 hours. BNP (last 3 results) No results for input(s): PROBNP in the last 8760 hours. HbA1C: No results for input(s): HGBA1C in the last 72 hours. CBG: No results for input(s): GLUCAP in the last 168 hours. Lipid Profile: No results for input(s): CHOL, HDL, LDLCALC, TRIG, CHOLHDL, LDLDIRECT in the last 72 hours. Thyroid Function Tests: No results for input(s): TSH, T4TOTAL, FREET4, T3FREE, THYROIDAB in the last 72 hours. Anemia Panel: No results for input(s): VITAMINB12, FOLATE, FERRITIN, TIBC, IRON, RETICCTPCT in the last 72 hours. Sepsis Labs: No results for input(s): PROCALCITON, LATICACIDVEN in the last 168 hours.  No results found for this or any previous visit (from the past 240 hour(s)).        Radiology Studies: No results found.      Scheduled Meds:  chlorhexidine gluconate (MEDLINE KIT)  15 mL Mouth Rinse BID   enoxaparin (LOVENOX) injection  50 mg Subcutaneous Q24H   feeding supplement  237 mL Oral TID BM   levothyroxine  75 mcg Oral Q0600   pantoprazole  40 mg Oral Daily   Continuous Infusions:  sodium chloride Stopped (12/03/20 0427)     LOS: 12 days    Time spent: 27 minutes    Sharen Hones, MD [facility]   To contact the attending provider between 7A-7P or the covering provider during after hours 7P-7A, please log into the web site [email or web address] and access using universal [place] password for that web site. If you do not have the password, please call the hospital operator.  12/14/2020, 4:12 PM

## 2020-12-14 NOTE — TOC Progression Note (Addendum)
Transition of Care [facility]) - Progression Note    Patient Details  Name: Gwendolyn [name] MRN: [id number] Date of Birth: 01/29/1946  Transition of Care [facility]) CM/SW [place], RN Phone Number: 12/14/2020, 10:08 AM  Clinical Narrative:     Reached out to facilities and asked that they review for a bed offer  Tina with [facility] made a bed offer I called the [place] and discussed, Ivin Booty agreed to the bed offer I provided her with [facility] phone number to call to arrange to complete paperwork  The plan is to DC tomorrow  Expected Discharge Plan: Assisted Living Barriers to Discharge: Continued Medical Work up  Expected Discharge Plan and Services Expected Discharge Plan: Assisted Living       Living arrangements for the past 2 months: [place] Expected Discharge Date: 12/09/20                                     Social Determinants of Health (SDOH) Interventions    Readmission Risk Interventions Readmission Risk Prevention Plan 12/04/2020  Post Dischage Appt Complete  Medication Screening Complete  Transportation Screening Complete  Some recent data might be hidden

## 2020-12-14 NOTE — TOC Progression Note (Signed)
Transition of Care [facility]) - Progression Note    Patient Details  Name: Gwendolyn [name] MRN: [id number] Date of Birth: 1945-08-22  Transition of Care [facility]) CM/SW [place], RN Phone Number: 12/14/2020, 10:04 AM  Clinical Narrative:    [place] Must requested updated clinical, CM uploaded updated clinical notes, PASSR pending   Expected Discharge Plan: Assisted Living Barriers to Discharge: Continued Medical Work up  Expected Discharge Plan and Services Expected Discharge Plan: Assisted Living       Living arrangements for the past 2 months: [place] Expected Discharge Date: 12/09/20                                     Social Determinants of Health (SDOH) Interventions    Readmission Risk Interventions Readmission Risk Prevention Plan 12/04/2020  Post Dischage Appt Complete  Medication Screening Complete  Transportation Screening Complete  Some recent data might be hidden

## 2020-12-15 DIAGNOSIS — J9601 Acute respiratory failure with hypoxia: Secondary | ICD-10-CM | POA: Diagnosis not present

## 2020-12-15 DIAGNOSIS — E870 Hyperosmolality and hypernatremia: Secondary | ICD-10-CM | POA: Diagnosis not present

## 2020-12-15 DIAGNOSIS — G9341 Metabolic encephalopathy: Secondary | ICD-10-CM | POA: Diagnosis not present

## 2020-12-15 DIAGNOSIS — I10 Essential (primary) hypertension: Secondary | ICD-10-CM | POA: Diagnosis not present

## 2020-12-15 LAB — SARS CORONAVIRUS 2 (TAT 6-24 HRS): SARS Coronavirus 2: NEGATIVE

## 2020-12-15 MED ORDER — PANTOPRAZOLE SODIUM 40 MG PO TBEC: 40.0000 mg | DELAYED_RELEASE_TABLET | Freq: Every day | ORAL | Status: AC

## 2020-12-15 MED ORDER — LEVOTHYROXINE SODIUM 75 MCG PO TABS: 75.0000 ug | ORAL_TABLET | Freq: Every day | ORAL | Status: AC

## 2020-12-15 NOTE — TOC Progression Note (Signed)
Transition of Care [facility]) - Progression Note    Patient Details  Name: Gwendolyn [name] MRN: [id number] Date of Birth: 04/20/1945  Transition of Care [facility]) CM/SW [place], RN Phone Number: 12/15/2020, 1:32 PM  Clinical Narrative:     DC summary sent, the patient is to transport [facility] today room 509, the POA sharon made aware, the bedside nurse called report to [facility], Expected Discharge Plan: Assisted Living Barriers to Discharge: Continued Medical Work up  Expected Discharge Plan and Services Expected Discharge Plan: Assisted Living       Living arrangements for the past 2 months: [place] Expected Discharge Date: 12/15/20                                     Social Determinants of Health (SDOH) Interventions    Readmission Risk Interventions Readmission Risk Prevention Plan 12/04/2020  Post Dischage Appt Complete  Medication Screening Complete  Transportation Screening Complete  Some recent data might be hidden

## 2020-12-15 NOTE — Progress Notes (Signed)
Occupational Therapy Treatment Patient Details Name: Gwendolyn [name] MRN: [id number] DOB: 08-17-1945 Today's Date: 12/15/2020    History of present illness Pt is a 75 y/o F admitted on 12/02/20 with c/c of AMS & near syncopal episode. Pt admitted for tx of possible accidental drug overdose. Chest x-ray revealed small L pleural effusion. Pt was minimally responsive & transitioned to comfort care on 12/08/20, however, 2 hours prior to transport to [place] pt demonstrated increased alertness & drinking PO fluids. Hospice Home transfer was cancelled. Pt noted to have mass on neck that was determined to be an enlarged submandibular gland.  PMH: anxiety, arthritis, asthma, breast CA (01/2010), chronic LBP, liver cirrhosis, depression, fibromyalgia, GERD, HTN, HLD, IBS, migraines, NASH, RLS, trigeminal neuralgia   OT comments  Ms Rossa was seen for OT treatment on this date. Upon arrival to room pt seated in chair, agreeable to session. Pt with multiple questions re discharge, answered within scope of OT and instructed pt to reach out to team if further questions. Pt required SBA + RW for ADL t/f. SBA grooming standing sinkside - pt tolerates ~5 mins prior to citing fatigue. MOD A don mesh underwear at chair - asssit for threading and pulling up over rear. Left in chair with chair alarm on. Pt making good progress toward goals. Pt continues to benefit from skilled OT services to maximize return to PLOF and minimize risk of future falls, injury, caregiver burden, and readmission. Will continue to follow POC. Discharge recommendation remains appropriate.    Follow Up Recommendations  SNF;Supervision/Assistance - 24 hour    Equipment Recommendations  3 in 1 bedside commode    Recommendations for Other Services      Precautions / Restrictions Precautions Precautions: Fall Restrictions Weight Bearing Restrictions: No       Mobility Bed Mobility               General bed mobility comments:  received and left in chair    Transfers Overall transfer level: Needs assistance Equipment used: Rolling walker (2 wheeled) Transfers: Sit to/from Stand Sit to Stand: Supervision              Balance Overall balance assessment: Needs assistance Sitting-balance support: No upper extremity supported;Feet supported Sitting balance-Leahy Scale: Good     Standing balance support: Single extremity supported;During functional activity Standing balance-Leahy Scale: Fair                             ADL either performed or assessed with clinical judgement   ADL Overall ADL's : Needs assistance/impaired                                       General ADL Comments: SBA + RW for ADL t/f. SBA grooming standing sinkside - pt tolerates ~5 mins prior to citing fatigue. MOD A don mesh underwear at chair - asssit for threading and pulling up over rear.      Cognition Arousal/Alertness: Awake/alert Behavior During Therapy: WFL for tasks assessed/performed Overall Cognitive Status: Within Functional Limits for tasks assessed                                          Exercises Exercises: Other exercises Other Exercises Other Exercises: Pt  educated re: DME recs, d/c recs, ECS, falls prevention, HEP Other Exercises: LBD, grooming, sit<>stand x3, sitting/standing balance/tolerance, ~40 ft mobility           Pertinent Vitals/ Pain       Pain Assessment: Faces Faces Pain Scale: Hurts a little bit Pain Location: R hip Pain Descriptors / Indicators: Dull Pain Intervention(s): Limited activity within patient's tolerance   Frequency  Min 2X/week        Progress Toward Goals  OT Goals(current goals can now be found in the care plan section)  Progress towards OT goals: Progressing toward goals  Acute Rehab OT Goals Patient Stated Goal: to go home OT Goal Formulation: With patient Time For Goal Achievement: 12/25/20 Potential to Achieve  Goals: Good ADL Goals Pt Will Perform Eating: with modified independence;sitting Pt Will Perform Grooming: standing;with modified independence Pt Will Transfer to Toilet: ambulating;regular height toilet;Independently  Plan Discharge plan remains appropriate;Frequency remains appropriate    Co-evaluation                 AM-PAC OT "6 Clicks" Daily Activity     Outcome Measure   Help from another person eating meals?: A Little Help from another person taking care of personal grooming?: A Little Help from another person toileting, which includes using toliet, bedpan, or urinal?: A Little Help from another person bathing (including washing, rinsing, drying)?: A Lot Help from another person to put on and taking off regular upper body clothing?: A Little Help from another person to put on and taking off regular lower body clothing?: A Lot 6 Click Score: 16    End of Session Equipment Utilized During Treatment: Rolling walker  OT Visit Diagnosis: Other abnormalities of gait and mobility (R26.89);Muscle weakness (generalized) (M62.81)   Activity Tolerance Patient tolerated treatment well   Patient Left in chair;with call bell/phone within reach;with chair alarm set   Nurse Communication          Time: [phone] OT Time Calculation (min): 24 min  Charges: OT General Charges $OT Visit: 1 Visit OT Treatments $Self Care/Home Management : 23-37 mins  Dessie Coma, M.S. OTR/L  12/15/20, 9:49 AM  ascom [phone]

## 2020-12-15 NOTE — Discharge Summary (Signed)
Gwendolyn [name] DHR:[id number] DOB: 1946/02/09 DOA: 12/02/2020  PCP: Barbaraann Boys, MD  Admit date: 12/02/2020 Discharge date: 12/15/2020  Admitted From: home Disposition: SNF  Recommendations for Outpatient Follow-up:  Follow up with PCP in 1 week Please obtain BMP/CBC in one week      Discharge Condition:Stable CODE STATUS:DNR  Diet recommendation:Dysphagia 3   Brief/Interim Summary: Please note that dc summary was taken from HPI, previous dc summary and review of chart. 75 y.o female with significant PMH as below who presented to the ED with chief complaints of altered mental status and near syncopal episode.  Patient was in the ICU on admission and apparently was intubated and sedated.POA, patient recently moved to an assisted living at [place].  Patient apparently has not been taking her home medication which consist of Lexapro 20 mg, Requip, Cymbalta, Lyrica, and buprenorphine for unknown period of time.  Per POA who manages her medication, today was the first time the patient took her medications.  Patient's POA states that they were walking 30 minutes after she took her medication around 330 when she suddenly complained of feeling dizzy near syncope.   Patient was able to sit down but was soon noted to be somnolent, clammy and incoherent.  POA also stated that patient's eyes appeared to be rolling backwards but did not lose consciousness entirely.  EMS was called and patient was transported to the ED.ED Course: On arrival to the ED, he was afebrile with blood pressure 130/63 mm Hg and pulse rate 106 beats/min, RR 23 with oxygen saturation sent on 6 L.  There were no obvious focal neurological deficits; She was somnolent requiring frequent sternal rubs and stimulation to keep her awake and breathing.  Patient was placed on n.p.o. with almost no fighting from the patient.  She received 3 mg of Narcan with minimal to no improvement of her symptoms.  Given concerns for possible  accidental overdose, patient was intubated for airway protection and respiratory failure.  PCCM consulted for admission to ICU.Patient was not responsive after extubation, POA had made a decision to transition to comfort care.  However, patient started waking up 8/25.  Patient condition continued to improve, eating better, able to ambulate with physical therapy.  Now stable to be discharged to SNF today.   #1.  Acute hypoxemic respiratory failure Acute metabolic encephalopathy. Patient was requiring intubation for airway protection. Patient condition has improved.   2.  Sialoadenitis with left neck swelling. CT-  Left submandibular gland sialoadenitis with no abscess or stone Condition has improved after taking antibiotics and steroids and fluids. ENT was consulted. No surgical intervention needed.    3.  Hypernatremia. Hypokalemia. Improved.   5.  Paroxysmal atrial fibrillation. History of CVA. Mild dementia. Not chronic on anticoagulation.    Discharge Diagnoses:  Active Problems:   Essential hypertension, benign   Acute metabolic encephalopathy   Acute respiratory failure with hypoxia (HCC)   Hypernatremia   Thrombocytopenia (HCC)   Morbidly obese [facility])    Discharge Instructions  Discharge Instructions     Diet - low sodium heart healthy   Complete by: As directed    Diet - low sodium heart healthy   Complete by: As directed    Increase activity slowly   Complete by: As directed    Increase activity slowly   Complete by: As directed       Allergies as of 12/15/2020       Reactions   Codeine Nausea And Vomiting  Oxycodone Itching   Silver Other (See Comments)   (Tegaderm) Blisters.  Okay on hands.        Medication List     STOP taking these medications    acetaminophen 500 MG tablet Commonly known as: TYLENOL   buprenorphine 2 MG Subl SL tablet Commonly known as: SUBUTEX   DULoxetine 20 MG capsule Commonly known as: CYMBALTA   enalapril  10 MG tablet Commonly known as: VASOTEC   escitalopram 20 MG tablet Commonly known as: LEXAPRO   furosemide 20 MG tablet Commonly known as: LASIX   omeprazole 20 MG capsule Commonly known as: PRILOSEC   potassium citrate 10 MEQ (1080 MG) SR tablet Commonly known as: UROCIT-K   pregabalin 50 MG capsule Commonly known as: LYRICA   rOPINIRole 2 MG tablet Commonly known as: REQUIP       TAKE these medications    albuterol 108 (90 Base) MCG/ACT inhaler Commonly known as: VENTOLIN HFA Inhale 2 puffs into the lungs every 6 (six) hours as needed for wheezing or shortness of breath.   calcium carbonate 1250 (500 Ca) MG tablet Commonly known as: OS-CAL - dosed in mg of elemental calcium Take 1 tablet by mouth daily.   levothyroxine 75 MCG tablet Commonly known as: SYNTHROID Take 1 tablet (75 mcg total) by mouth daily at 6 (six) AM. Start taking on: December 16, 2020 What changed:  medication strength how much to take when to take this   pantoprazole 40 MG tablet Commonly known as: PROTONIX Take 1 tablet (40 mg total) by mouth daily. Start taking on: December 16, 2020   Vitamin D 50 MCG (2000 UT) tablet Take 2,000 Units by mouth daily.        Contact information for follow-up providers     Barbaraann Boys, MD Follow up in 1 week(s).   Specialty: Pediatrics Contact information: [address] [place] [address] [phone]              Contact information for after-discharge care     Destination     [facility] Preferred SNF .   Service: Skilled Nursing Contact information: [place] [address] [phone]                    Allergies  Allergen Reactions   Codeine Nausea And Vomiting   Oxycodone Itching   Silver Other (See Comments)    (Tegaderm) Blisters.  Okay on hands.    Consultations: ENT, PCCM   Procedures/Studies: CT ANGIO HEAD NECK W WO CM  Result Date:  12/02/2020 CLINICAL DATA:  Altered and somnolent requiring intubation. Evaluate ICH or CVA EXAM: CT ANGIOGRAPHY HEAD AND NECK TECHNIQUE: Multidetector CT imaging of the head and neck was performed using the standard protocol during bolus administration of intravenous contrast. Multiplanar CT image reconstructions and MIPs were obtained to evaluate the vascular anatomy. Carotid stenosis measurements (when applicable) are obtained utilizing NASCET criteria, using the distal internal carotid diameter as the denominator. CONTRAST:  66m OMNIPAQUE IOHEXOL 350 MG/ML SOLN COMPARISON:  None. FINDINGS: CT HEAD FINDINGS Brain: There is no mass, hemorrhage or extra-axial collection. The size and configuration of the ventricles and extra-axial CSF spaces are normal. There is no acute or chronic infarction. The brain parenchyma is normal. Dilated perivascular space of the right lentiform nucleus. Skull: The visualized skull base, calvarium and extracranial soft tissues are normal. Sinuses/Orbits: No fluid levels or advanced mucosal thickening of the visualized paranasal sinuses. No  mastoid or middle ear effusion. The orbits are normal. CTA NECK FINDINGS SKELETON: There is no bony spinal canal stenosis. No lytic or blastic lesion. OTHER NECK: Normal pharynx, larynx and major salivary glands. No cervical lymphadenopathy. Unremarkable thyroid gland. UPPER CHEST: No pneumothorax or pleural effusion. No nodules or masses. AORTIC ARCH: There is no calcific atherosclerosis of the aortic arch. There is no aneurysm, dissection or hemodynamically significant stenosis of the visualized portion of the aorta. Conventional 3 vessel aortic branching pattern. The visualized proximal subclavian arteries are widely patent. RIGHT CAROTID SYSTEM: Normal without aneurysm, dissection or stenosis. LEFT CAROTID SYSTEM: Normal without aneurysm, dissection or stenosis. VERTEBRAL ARTERIES: Left dominant configuration. Both origins are clearly patent.  There is no dissection, occlusion or flow-limiting stenosis to the skull base (V1-V3 segments). CTA HEAD FINDINGS POSTERIOR CIRCULATION: --Vertebral arteries: Normal V4 segments. --Inferior cerebellar arteries: Normal. --Basilar artery: Normal. --Superior cerebellar arteries: Normal. --Posterior cerebral arteries (PCA): Normal. ANTERIOR CIRCULATION: --Intracranial internal carotid arteries: Normal. --Anterior cerebral arteries (ACA): Normal. Both A1 segments are present. Patent anterior communicating artery (a-comm). --Middle cerebral arteries (MCA): Normal. VENOUS SINUSES: As permitted by contrast timing, patent. ANATOMIC VARIANTS: None Review of the MIP images confirms the above findings. IMPRESSION: Normal CTA of the head and neck. Electronically Signed   By: Ulyses Jarred M.D.   On: 12/02/2020 20:08   DG Abd 1 View  Result Date: 12/06/2020 CLINICAL DATA:  Nasogastric tube placement EXAM: ABDOMEN - 1 VIEW COMPARISON:  Earlier film of the same day FINDINGS: The nasogastric tube has been advanced into the stomach which is partially decompressed. Multiple gas distended nondilated small bowel loops are noted in the mid abdomen. The lower abdomen is excluded. Cholecystectomy clips. Thoracolumbar scoliosis. IMPRESSION: Nasogastric tube into decompressed stomach. Electronically Signed   By: Lucrezia Europe M.D.   On: 12/06/2020 13:41   DG Abd 1 View  Result Date: 12/06/2020 CLINICAL DATA:  NG tube placement EXAM: ABDOMEN - 1 VIEW COMPARISON:  None. FINDINGS: Nasogastric tube with the tip projecting over the distal esophagus 8 cm above the GE junction. Recommend advancing the nasogastric tube 18 cm. No bowel dilatation to suggest obstruction. No evidence of pneumoperitoneum, portal venous gas or pneumatosis. No pathologic calcifications along the expected course of the ureters. Severe dextroscoliosis of the thoracolumbar spine. IMPRESSION: Nasogastric tube with the tip projecting over the distal esophagus 8 cm above  the GE junction. Recommend advancing the nasogastric tube 18 cm. Electronically Signed   By: Kathreen Devoid M.D.   On: 12/06/2020 12:36   CT HEAD WO CONTRAST (5MM)  Result Date: 12/09/2020 CLINICAL DATA:  Delirium, neck swelling EXAM: CT HEAD WITHOUT CONTRAST TECHNIQUE: Contiguous axial images were obtained from the base of the skull through the vertex without intravenous contrast. COMPARISON:  12/06/2020 FINDINGS: Brain: Chronic right basal ganglia lacunar infarct. No signs of acute infarct or hemorrhage. Lateral ventricles and remaining midline structures are unremarkable. No acute extra-axial fluid collections. No mass effect. Vascular: No hyperdense vessel or unexpected calcification. Skull: Normal. Negative for fracture or focal lesion. Sinuses/Orbits: Mild mucosal thickening within the right maxillary, ethmoid, and sphenoid sinus. No gas fluid levels. Other: None. IMPRESSION: 1. Stable head CT.  No acute intracranial process. Electronically Signed   By: Randa Ngo M.D.   On: 12/09/2020 16:00   CT HEAD WO CONTRAST (5MM)  Result Date: 12/06/2020 CLINICAL DATA:  Somnolent, unresponsive, history cirrhosis, hypertension, NASH EXAM: CT HEAD WITHOUT CONTRAST TECHNIQUE: Contiguous axial images were obtained from the base of the  skull through the vertex without intravenous contrast. Sagittal and coronal MPR images reconstructed from axial data set. COMPARISON:  12/02/2020 FINDINGS: Brain: Examination mildly degraded by motion artifacts. Mild atrophy. Normal ventricular morphology. No midline shift or mass effect. Dilated perivascular space at inferior RIGHT basal ganglia unchanged. Otherwise normal appearance of brain parenchyma. No intracranial hemorrhage, mass lesion, or evidence of acute infarction. No extra-axial fluid collections. Vascular: No hyperdense vessels. Mild atherosclerotic calcifications of vertebral arteries at skull base. Skull: Demineralized but grossly intact within limitations of motion  Sinuses/Orbits: Clear Other: N/A IMPRESSION: No acute intracranial abnormalities. Electronically Signed   By: Lavonia Dana M.D.   On: 12/06/2020 12:38   CT SOFT TISSUE NECK W CONTRAST  Result Date: 12/09/2020 CLINICAL DATA:  Neck abscess, deep tissue. EXAM: CT NECK WITH CONTRAST TECHNIQUE: Multidetector CT imaging of the neck was performed using the standard protocol following the bolus administration of intravenous contrast. CONTRAST:  66m OMNIPAQUE IOHEXOL 350 MG/ML SOLN COMPARISON:  12/02/2020 FINDINGS: Pharynx and larynx: No evidence of mucosal mass lesion. There appears to be sialoadenitis of the left submandibular gland with inflammatory change also affecting the parapharyngeal space on the left, probably with some mucosal edema on the left. Salivary glands: As noted above, both parotid glands in the right submandibular gland are normal. The left submandibular gland is enlarged and edematous consistent with sialoadenitis. No visible ductal stone. Surrounding soft tissue inflammatory changes without a defined drainable abscess. Thyroid: Normal Lymph nodes: No enlarged or low-density lymph nodes. Vascular: Ordinary mild atherosclerotic calcification at both carotid bifurcations. Limited intracranial: Negative Visualized orbits: Normal Mastoids and visualized paranasal sinuses: Clear except for mild mucosal thickening of the right maxillary sinus. Skeleton: Scoliotic curvature convex to the left. Minimal cervical spondylosis. Upper chest: Normal Other: None IMPRESSION: Sialoadenitis of the submandibular gland on the left with swelling and surrounding soft tissue edematous change. This includes inflammatory edema of the left parapharyngeal space and probably of the left hypopharyngeal mucosa. No evidence of an actual drainable fluid collection. No evidence of stone disease as an etiology of the inflammation. Electronically Signed   By: MNelson ChimesM.D.   On: 12/09/2020 15:49   MR BRAIN W WO  CONTRAST  Result Date: 12/07/2020 CLINICAL DATA:  Initial evaluation for altered mental status, unclear etiology. EXAM: MRI HEAD WITHOUT AND WITH CONTRAST TECHNIQUE: Multiplanar, multiecho pulse sequences of the brain and surrounding structures were obtained without and with intravenous contrast. CONTRAST:  176mGADAVIST GADOBUTROL 1 MMOL/ML IV SOLN COMPARISON:  Comparison made with prior CT from earlier the same day as well as recent CTA from 12/02/2020. FINDINGS: Brain: Mild diffuse prominence of the CSF containing spaces compatible generalized age-related cerebral atrophy. Remote lacunar infarct present at the anterior right basal ganglia. Associated mild chronic hemosiderin staining at this location. Few additional prominent dilated perivascular spaces noted about the right greater than left basal ganglia. No abnormal foci of restricted diffusion to suggest acute or subacute ischemia or changes related to seizure. Gray-white matter differentiation otherwise maintained. No other areas of remote cortical infarction. No other evidence for acute or chronic intracranial hemorrhage. No mass lesion, midline shift or mass effect. No hydrocephalus or extra-axial fluid collection. Pituitary gland and suprasellar region normal. Midline structures intact. Symmetric increased T1 hyperintense signal intensity involving the bilateral basal ganglia, primarily involving the globus palladi, likely related to patient history of intrinsic liver disease. No abnormal enhancement. Vascular: Major intracranial vascular flow voids are maintained. Skull and upper cervical spine: Craniocervical junction within normal  limits. Bone marrow signal intensity diffusely heterogeneous without focal marrow replacing lesion. No scalp soft tissue abnormality. Sinuses/Orbits: Globes and orbital soft tissues within normal limits. Scattered mucosal thickening noted throughout the paranasal sinuses. No air-fluid levels to suggest acute sinusitis.  Mastoid air cells are clear. Inner ear structures grossly normal. Other: None. IMPRESSION: 1. No acute intracranial abnormality. 2. Remote right basal ganglia lacunar infarct. 3. Symmetric increased T1 hyperintense signal intensity involving the bilateral basal ganglia, likely related to patient history of cirrhosis/intrinsic liver disease. Electronically Signed   By: Jeannine Boga M.D.   On: 12/07/2020 01:08   DG Chest Port 1 View  Result Date: 12/02/2020 CLINICAL DATA:  Intubation and NG tube placement EXAM: PORTABLE CHEST 1 VIEW COMPARISON:  Radiograph 07/17/2019 FINDINGS: Endotracheal tube overlies the midthoracic trachea. Nasogastric tube passes below the diaphragm, tip overlying the stomach. Small left pleural effusion. Left medial basilar opacities. No visible pneumothorax. Bilateral shoulder degenerative changes. Either subperiosteal resorption or prior right distal clavicle excision. IMPRESSION: Endotracheal tube overlies the midthoracic trachea. Nasogastric tube tip overlies the stomach. Left medial basilar opacities which could be atelectasis or aspiration/infection. Small left pleural effusion. Unchanged cardiomegaly. Electronically Signed   By: Maurine Simmering M.D.   On: 12/02/2020 19:04   ECHOCARDIOGRAM COMPLETE BUBBLE STUDY  Result Date: 12/05/2020    ECHOCARDIOGRAM REPORT   Patient Name:   SWARA DONZE [facility] Date of Exam: 12/04/2020 Medical Rec #:  [id number]      Height:       63.0 in Accession #:    [id number]     Weight:       216.3 lb Date of Birth:  April 23, 1945     BSA:          1.999 m Patient Age:    35 years       BP:           104/71 mmHg Patient Gender: F              HR:           82 bpm. Exam Location:  [place] Procedure: 2D Echo and Saline Contrast Bubble Study Indications:     Atrial fibrillation 427.31  History:         Patient has no prior history of Echocardiogram examinations.  Sonographer:     Kathlen Brunswick tfgggggg Referring Phys:  [id number] ADAM ROSS SCHERTZ Diagnosing  Phys: Ida Rogue MD IMPRESSIONS  1. Left ventricular ejection fraction, by estimation, is 60 to 65%. The left ventricle has normal function. The left ventricle has no regional wall motion abnormalities. Left ventricular diastolic parameters are consistent with Grade I diastolic dysfunction (impaired relaxation).  2. Right ventricular systolic function is normal. The right ventricular size is normal.  3. Left atrial size was mildly dilated.  4. Agitated saline contrast bubble study was negative, with no evidence of any interatrial shunt.  5. Rhythm is normal sinus FINDINGS  Left Ventricle: Left ventricular ejection fraction, by estimation, is 60 to 65%. The left ventricle has normal function. The left ventricle has no regional wall motion abnormalities. The left ventricular internal cavity size was normal in size. There is  no left ventricular hypertrophy. Left ventricular diastolic parameters are consistent with Grade I diastolic dysfunction (impaired relaxation). Right Ventricle: The right ventricular size is normal. No increase in right ventricular wall thickness. Right ventricular systolic function is normal. Left Atrium: Left atrial size was mildly dilated. Right Atrium: Right atrial size was normal in size. Pericardium: There  is no evidence of pericardial effusion. Mitral Valve: The mitral valve is normal in structure. No evidence of mitral valve regurgitation. No evidence of mitral valve stenosis. Tricuspid Valve: The tricuspid valve is normal in structure. Tricuspid valve regurgitation is mild . No evidence of tricuspid stenosis. Aortic Valve: The aortic valve was not well visualized. Aortic valve regurgitation is not visualized. No aortic stenosis is present. Aortic valve peak gradient measures 10.6 mmHg. Pulmonic Valve: The pulmonic valve was normal in structure. Pulmonic valve regurgitation is not visualized. No evidence of pulmonic stenosis. Aorta: The aortic root is normal in size and structure.  Venous: The inferior vena cava is normal in size with greater than 50% respiratory variability, suggesting right atrial pressure of 3 mmHg. IAS/Shunts: No atrial level shunt detected by color flow Doppler. Agitated saline contrast was given intravenously to evaluate for intracardiac shunting. Agitated saline contrast bubble study was negative, with no evidence of any interatrial shunt.  LEFT VENTRICLE PLAX 2D LVIDd:         4.26 cm  Diastology LVIDs:         2.97 cm  LV e' medial:    6.20 cm/s LV PW:         1.16 cm  LV E/e' medial:  11.2 LV IVS:        1.05 cm  LV e' lateral:   10.40 cm/s LVOT diam:     2.00 cm  LV E/e' lateral: 6.7 LV SV:         56 LV SV Index:   28 LVOT Area:     3.14 cm  RIGHT VENTRICLE RV Basal diam:  3.16 cm RV S prime:     12.60 cm/s TAPSE (M-mode): 1.9 cm LEFT ATRIUM           Index       RIGHT ATRIUM           Index LA diam:      4.30 cm 2.15 cm/m  RA Area:     14.10 cm LA Vol (A2C): 35.4 ml 17.71 ml/m RA Volume:   35.50 ml  17.76 ml/m LA Vol (A4C): 56.0 ml 28.01 ml/m  AORTIC VALVE                PULMONIC VALVE AV Area (Vmax): 1.89 cm    PV Vmax:       1.03 m/s AV Vmax:        163.00 cm/s PV Peak grad:  4.2 mmHg AV Peak Grad:   10.6 mmHg LVOT Vmax:      98.30 cm/s LVOT Vmean:     62.100 cm/s LVOT VTI:       0.178 m  AORTA Ao Root diam: 2.80 cm Ao Asc diam:  2.70 cm MITRAL VALVE MV Area (PHT): 5.84 cm    SHUNTS MV Decel Time: 130 msec    Systemic VTI:  0.18 m MV E velocity: 69.40 cm/s  Systemic Diam: 2.00 cm MV A velocity: 75.40 cm/s MV E/A ratio:  0.92 Ida Rogue MD Electronically signed by Ida Rogue MD Signature Date/Time: 12/05/2020/9:46:26 AM    Final       Subjective:   Discharge Exam: Vitals:   12/15/20 0536 12/15/20 0804  BP: (!) 154/97 128/68  Pulse: 91 77  Resp: 17 16  Temp: 98.7 F (37.1 C) 97.9 F (36.6 C)  SpO2: 99% 98%   Vitals:   12/14/20 1548 12/14/20 2000 12/15/20 0536 12/15/20 0804  BP: 125/66 (!) 150/67 (!) 154/97  128/68  Pulse: 90 87  91 77  Resp: 16 16 17 16   Temp: 98 F (36.7 C) 98.4 F (36.9 C) 98.7 F (37.1 C) 97.9 F (36.6 C)  TempSrc:      SpO2: 98% 97% 99% 98%  Weight:      Height:        General: Pt is alert, awake, not in acute distress Cardiovascular: RRR, S1/S2 +, no rubs, no gallops Respiratory: CTA bilaterally, no wheezing, no rhonchi Abdominal: Soft, NT, ND, bowel sounds + Extremities: no edema    The results of significant diagnostics from this hospitalization (including imaging, microbiology, ancillary and laboratory) are listed below for reference.     Microbiology: Recent Results (from the past 240 hour(s))  SARS CORONAVIRUS 2 (TAT 6-24 HRS) Nasopharyngeal Nasopharyngeal Swab     Status: None   Collection Time: 12/14/20  5:30 PM   Specimen: Nasopharyngeal Swab  Result Value Ref Range Status   SARS Coronavirus 2 NEGATIVE NEGATIVE Final    Comment: (NOTE) SARS-CoV-2 target nucleic acids are NOT DETECTED.  The SARS-CoV-2 RNA is generally detectable in upper and lower respiratory specimens during the acute phase of infection. Negative results do not preclude SARS-CoV-2 infection, do not rule out co-infections with other pathogens, and should not be used as the sole basis for treatment or other patient management decisions. Negative results must be combined with clinical observations, patient history, and epidemiological information. The expected result is Negative.  Fact Sheet for Patients: SugarRoll.be  Fact Sheet for Healthcare Providers: https://www.woods-mathews.com/  This test is not yet approved or cleared by the [place] FDA and  has been authorized for detection and/or diagnosis of SARS-CoV-2 by FDA under an Emergency Use Authorization (EUA). This EUA will remain  in effect (meaning this test can be used) for the duration of the COVID-19 declaration under Se ction 564(b)(1) of the Act, 21 U.S.C. section 360bbb-3(b)(1), unless  the authorization is terminated or revoked sooner.  Performed at [facility], [place] [address] [place], [place] [address]      Labs: BNP (last 3 results) No results for input(s): BNP in the last 8760 hours. Basic Metabolic Panel: Recent Labs  Lab 12/10/20 0712 12/11/20 0504 12/12/20 0501 12/13/20 0442  NA 147* 146* 141 137  K 3.5 3.2* 3.7 3.8  CL 110 110 107 103  CO2 30 32 27 28  GLUCOSE 97 116* 76 97  BUN 25* 27* 26* 27*  CREATININE 0.75 0.85 0.82 0.85  CALCIUM 9.7 10.2 9.9 9.5  MG 2.1 2.1 2.3 2.1  PHOS 3.2 3.3 3.2 3.4   Liver Function Tests: No results for input(s): AST, ALT, ALKPHOS, BILITOT, PROT, ALBUMIN in the last 168 hours. No results for input(s): LIPASE, AMYLASE in the last 168 hours. No results for input(s): AMMONIA in the last 168 hours. CBC: Recent Labs  Lab 12/10/20 0712  WBC 6.9  NEUTROABS 5.3  HGB 12.4  HCT 39.3  MCV 97.0  PLT 105*   Cardiac Enzymes: No results for input(s): CKTOTAL, CKMB, CKMBINDEX, TROPONINI in the last 168 hours. BNP: Invalid input(s): POCBNP CBG: No results for input(s): GLUCAP in the last 168 hours. D-Dimer No results for input(s): DDIMER in the last 72 hours. Hgb A1c No results for input(s): HGBA1C in the last 72 hours. Lipid Profile No results for input(s): CHOL, HDL, LDLCALC, TRIG, CHOLHDL, LDLDIRECT in the last 72 hours. Thyroid function studies No results for input(s): TSH, T4TOTAL, T3FREE, THYROIDAB in the last 72 hours.  Invalid input(s): FREET3 Anemia work up No results for input(s): VITAMINB12, FOLATE, FERRITIN, TIBC, IRON, RETICCTPCT in the last 72 hours. Urinalysis    Component Value Date/Time   COLORURINE YELLOW (A) 07/17/2019 1452   APPEARANCEUR HAZY (A) 07/17/2019 1452   LABSPEC 1.011 07/17/2019 1452   PHURINE 5.0 07/17/2019 1452   GLUCOSEU NEGATIVE 07/17/2019 1452   HGBUR MODERATE (A) 07/17/2019 1452   BILIRUBINUR NEGATIVE 07/17/2019 1452   BILIRUBINUR neg 11/12/2012 1451   KETONESUR  NEGATIVE 07/17/2019 1452   PROTEINUR 30 (A) 07/17/2019 1452   UROBILINOGEN 0.2 11/12/2012 1451   NITRITE NEGATIVE 07/17/2019 1452   LEUKOCYTESUR SMALL (A) 07/17/2019 1452   Sepsis Labs Invalid input(s): PROCALCITONIN,  WBC,  LACTICIDVEN Microbiology Recent Results (from the past 240 hour(s))  SARS CORONAVIRUS 2 (TAT 6-24 HRS) Nasopharyngeal Nasopharyngeal Swab     Status: None   Collection Time: 12/14/20  5:30 PM   Specimen: Nasopharyngeal Swab  Result Value Ref Range Status   SARS Coronavirus 2 NEGATIVE NEGATIVE Final    Comment: (NOTE) SARS-CoV-2 target nucleic acids are NOT DETECTED.  The SARS-CoV-2 RNA is generally detectable in upper and lower respiratory specimens during the acute phase of infection. Negative results do not preclude SARS-CoV-2 infection, do not rule out co-infections with other pathogens, and should not be used as the sole basis for treatment or other patient management decisions. Negative results must be combined with clinical observations, patient history, and epidemiological information. The expected result is Negative.  Fact Sheet for Patients: SugarRoll.be  Fact Sheet for Healthcare Providers: https://www.woods-mathews.com/  This test is not yet approved or cleared by the [place] FDA and  has been authorized for detection and/or diagnosis of SARS-CoV-2 by FDA under an Emergency Use Authorization (EUA). This EUA will remain  in effect (meaning this test can be used) for the duration of the COVID-19 declaration under Se ction 564(b)(1) of the Act, 21 U.S.C. section 360bbb-3(b)(1), unless the authorization is terminated or revoked sooner.  Performed at [facility], [place] [address], [place], [place] [address]      Time coordinating discharge: Over 30 minutes  SIGNED:   Nolberto Hanlon, MD  [facility] 12/15/2020, 12:18 PM Pager   If 7PM-7AM, please contact  night-coverage [email or web address] Password [email or web address]

## 2020-12-15 NOTE — Progress Notes (Signed)
Report given to [facility] Nurse.

## 2021-01-24 LAB — BLOOD GAS, ARTERIAL
Acid-base deficit: 3.8 mmol/L — ABNORMAL HIGH (ref 0.0–2.0)
Bicarbonate: 23.8 mmol/L (ref 20.0–28.0)
FIO2: 0.4
MECHVT: 380 mL
Mechanical Rate: 14
O2 Saturation: 96.4 %
PEEP: 5 cmH2O
Patient temperature: 37
pCO2 arterial: 53 mmHg — ABNORMAL HIGH (ref 32.0–48.0)
pH, Arterial: 7.26 — ABNORMAL LOW (ref 7.350–7.450)
pO2, Arterial: 97 mmHg (ref 83.0–108.0)

## 2021-07-02 ENCOUNTER — Emergency Department

## 2021-07-02 ENCOUNTER — Inpatient Hospital Stay
Admission: EM | Admit: 2021-07-02 | Discharge: 2021-07-12 | DRG: 563 | Disposition: A | Discharge status: Skilled Nursing Facility | Attending: Internal Medicine | Admitting: Internal Medicine

## 2021-07-02 ENCOUNTER — Other Ambulatory Visit: Payer: Self-pay

## 2021-07-02 DIAGNOSIS — J302 Other seasonal allergic rhinitis: Secondary | ICD-10-CM | POA: Diagnosis present

## 2021-07-02 DIAGNOSIS — M25562 Pain in left knee: Secondary | ICD-10-CM | POA: Diagnosis not present

## 2021-07-02 DIAGNOSIS — M797 Fibromyalgia: Secondary | ICD-10-CM | POA: Diagnosis present

## 2021-07-02 DIAGNOSIS — E039 Hypothyroidism, unspecified: Secondary | ICD-10-CM | POA: Diagnosis present

## 2021-07-02 DIAGNOSIS — M1712 Unilateral primary osteoarthritis, left knee: Secondary | ICD-10-CM | POA: Diagnosis present

## 2021-07-02 DIAGNOSIS — Z825 Family history of asthma and other chronic lower respiratory diseases: Secondary | ICD-10-CM

## 2021-07-02 DIAGNOSIS — S42211A Unspecified displaced fracture of surgical neck of right humerus, initial encounter for closed fracture: Secondary | ICD-10-CM | POA: Diagnosis present

## 2021-07-02 DIAGNOSIS — G500 Trigeminal neuralgia: Secondary | ICD-10-CM | POA: Diagnosis present

## 2021-07-02 DIAGNOSIS — F0393 Unspecified dementia, unspecified severity, with mood disturbance: Secondary | ICD-10-CM | POA: Diagnosis present

## 2021-07-02 DIAGNOSIS — W19XXXD Unspecified fall, subsequent encounter: Secondary | ICD-10-CM

## 2021-07-02 DIAGNOSIS — S82142A Displaced bicondylar fracture of left tibia, initial encounter for closed fracture: Principal | ICD-10-CM | POA: Diagnosis present

## 2021-07-02 DIAGNOSIS — G2581 Restless legs syndrome: Secondary | ICD-10-CM | POA: Diagnosis present

## 2021-07-02 DIAGNOSIS — R21 Rash and other nonspecific skin eruption: Secondary | ICD-10-CM | POA: Diagnosis not present

## 2021-07-02 DIAGNOSIS — Z9012 Acquired absence of left breast and nipple: Secondary | ICD-10-CM

## 2021-07-02 DIAGNOSIS — S42301A Unspecified fracture of shaft of humerus, right arm, initial encounter for closed fracture: Secondary | ICD-10-CM | POA: Diagnosis present

## 2021-07-02 DIAGNOSIS — S42214K Unspecified nondisplaced fracture of surgical neck of right humerus, subsequent encounter for fracture with nonunion: Secondary | ICD-10-CM | POA: Diagnosis not present

## 2021-07-02 DIAGNOSIS — K219 Gastro-esophageal reflux disease without esophagitis: Secondary | ICD-10-CM | POA: Diagnosis present

## 2021-07-02 DIAGNOSIS — G8929 Other chronic pain: Secondary | ICD-10-CM | POA: Diagnosis present

## 2021-07-02 DIAGNOSIS — Z20822 Contact with and (suspected) exposure to covid-19: Secondary | ICD-10-CM | POA: Diagnosis present

## 2021-07-02 DIAGNOSIS — Z9884 Bariatric surgery status: Secondary | ICD-10-CM

## 2021-07-02 DIAGNOSIS — I10 Essential (primary) hypertension: Secondary | ICD-10-CM | POA: Diagnosis present

## 2021-07-02 DIAGNOSIS — K7460 Unspecified cirrhosis of liver: Secondary | ICD-10-CM | POA: Diagnosis not present

## 2021-07-02 DIAGNOSIS — R296 Repeated falls: Secondary | ICD-10-CM

## 2021-07-02 DIAGNOSIS — W19XXXA Unspecified fall, initial encounter: Secondary | ICD-10-CM | POA: Diagnosis present

## 2021-07-02 DIAGNOSIS — F0394 Unspecified dementia, unspecified severity, with anxiety: Secondary | ICD-10-CM | POA: Diagnosis present

## 2021-07-02 DIAGNOSIS — M545 Low back pain, unspecified: Secondary | ICD-10-CM | POA: Diagnosis present

## 2021-07-02 DIAGNOSIS — Z7401 Bed confinement status: Secondary | ICD-10-CM

## 2021-07-02 DIAGNOSIS — Z8261 Family history of arthritis: Secondary | ICD-10-CM

## 2021-07-02 DIAGNOSIS — Z885 Allergy status to narcotic agent status: Secondary | ICD-10-CM

## 2021-07-02 DIAGNOSIS — Z820 Family history of epilepsy and other diseases of the nervous system: Secondary | ICD-10-CM

## 2021-07-02 DIAGNOSIS — Y92003 Bedroom of unspecified non-institutional (private) residence as the place of occurrence of the external cause: Secondary | ICD-10-CM

## 2021-07-02 DIAGNOSIS — W010XXA Fall on same level from slipping, tripping and stumbling without subsequent striking against object, initial encounter: Secondary | ICD-10-CM | POA: Diagnosis present

## 2021-07-02 DIAGNOSIS — K7581 Nonalcoholic steatohepatitis (NASH): Secondary | ICD-10-CM | POA: Diagnosis present

## 2021-07-02 DIAGNOSIS — Z66 Do not resuscitate: Secondary | ICD-10-CM | POA: Diagnosis present

## 2021-07-02 DIAGNOSIS — K589 Irritable bowel syndrome without diarrhea: Secondary | ICD-10-CM | POA: Diagnosis present

## 2021-07-02 DIAGNOSIS — E785 Hyperlipidemia, unspecified: Secondary | ICD-10-CM | POA: Diagnosis present

## 2021-07-02 DIAGNOSIS — Z91048 Other nonmedicinal substance allergy status: Secondary | ICD-10-CM

## 2021-07-02 DIAGNOSIS — Z79899 Other long term (current) drug therapy: Secondary | ICD-10-CM

## 2021-07-02 DIAGNOSIS — Z853 Personal history of malignant neoplasm of breast: Secondary | ICD-10-CM

## 2021-07-02 DIAGNOSIS — S42291A Other displaced fracture of upper end of right humerus, initial encounter for closed fracture: Secondary | ICD-10-CM

## 2021-07-02 DIAGNOSIS — Z79890 Hormone replacement therapy (postmenopausal): Secondary | ICD-10-CM

## 2021-07-02 LAB — COMPREHENSIVE METABOLIC PANEL
ALT: 38 U/L (ref 0–44)
AST: 87 U/L — ABNORMAL HIGH (ref 15–41)
Albumin: 2.7 g/dL — ABNORMAL LOW (ref 3.5–5.0)
Alkaline Phosphatase: 96 U/L (ref 38–126)
Anion gap: 6 (ref 5–15)
BUN: 14 mg/dL (ref 8–23)
CO2: 27 mmol/L (ref 22–32)
Calcium: 9.6 mg/dL (ref 8.9–10.3)
Chloride: 102 mmol/L (ref 98–111)
Creatinine, Ser: 0.72 mg/dL (ref 0.44–1.00)
GFR, Estimated: >60 mL/min (ref >60)
Glucose, Bld: 112 mg/dL — ABNORMAL HIGH (ref 70–99)
Potassium: 4.1 mmol/L (ref 3.5–5.1)
Sodium: 135 mmol/L (ref 135–145)
Total Bilirubin: 1.3 mg/dL — ABNORMAL HIGH (ref 0.3–1.2)
Total Protein: 6.3 g/dL — ABNORMAL LOW (ref 6.5–8.1)

## 2021-07-02 LAB — URINALYSIS, ROUTINE W REFLEX MICROSCOPIC
Bilirubin Urine: NEGATIVE
Glucose, UA: NEGATIVE mg/dL
Hgb urine dipstick: NEGATIVE
Ketones, ur: NEGATIVE mg/dL
Leukocytes,Ua: NEGATIVE
Nitrite: NEGATIVE
Protein, ur: NEGATIVE mg/dL
Specific Gravity, Urine: 1.006 (ref 1.005–1.030)
pH: 9 — ABNORMAL HIGH (ref 5.0–8.0)

## 2021-07-02 LAB — CBC WITH DIFFERENTIAL/PLATELET
Abs Immature Granulocytes: 0.01 10*3/uL (ref 0.00–0.07)
Basophils Absolute: 0 10*3/uL (ref 0.0–0.1)
Basophils Relative: 1 %
Eosinophils Absolute: 0 10*3/uL (ref 0.0–0.5)
Eosinophils Relative: 0 %
HCT: 33.4 % — ABNORMAL LOW (ref 36.0–46.0)
Hemoglobin: 10.4 g/dL — ABNORMAL LOW (ref 12.0–15.0)
Immature Granulocytes: 0 %
Lymphocytes Relative: 18 %
Lymphs Abs: 0.5 10*3/uL — ABNORMAL LOW (ref 0.7–4.0)
MCH: 30.1 pg (ref 26.0–34.0)
MCHC: 31.1 g/dL (ref 30.0–36.0)
MCV: 96.5 fL (ref 80.0–100.0)
Monocytes Absolute: 0.3 10*3/uL (ref 0.1–1.0)
Monocytes Relative: 10 %
Neutro Abs: 2.1 10*3/uL (ref 1.7–7.7)
Neutrophils Relative %: 71 %
Platelets: 182 10*3/uL (ref 150–400)
RBC: 3.46 MIL/uL — ABNORMAL LOW (ref 3.87–5.11)
RDW: 16.4 % — ABNORMAL HIGH (ref 11.5–15.5)
WBC: 2.9 10*3/uL — ABNORMAL LOW (ref 4.0–10.5)
nRBC: 0 % (ref 0.0–0.2)

## 2021-07-02 MED ORDER — ONDANSETRON HCL 4 MG/2ML IJ SOLN
4.0000 mg | Freq: Once | INTRAMUSCULAR | Status: AC
  Administered 2021-07-02: 4 mg via INTRAVENOUS
  Filled 2021-07-02: qty 2

## 2021-07-02 MED ORDER — SODIUM CHLORIDE 0.9% FLUSH
3.0000 mL | Freq: Two times a day (BID) | INTRAVENOUS | Status: DC
  Administered 2021-07-02 – 2021-07-12 (×19): 3 mL via INTRAVENOUS

## 2021-07-02 MED ORDER — VITAMIN D 25 MCG (1000 UNIT) PO TABS
2000.0000 [IU] | ORAL_TABLET | Freq: Every day | ORAL | Status: DC
  Administered 2021-07-02 – 2021-07-12 (×11): 2000 [IU] via ORAL
  Filled 2021-07-02 (×11): qty 2

## 2021-07-02 MED ORDER — HYDROCODONE-ACETAMINOPHEN 5-325 MG PO TABS
1.0000 | ORAL_TABLET | Freq: Once | ORAL | Status: AC
  Administered 2021-07-02: 1 via ORAL
  Filled 2021-07-02: qty 1

## 2021-07-02 MED ORDER — ENOXAPARIN SODIUM 40 MG/0.4ML IJ SOSY
0.5000 mg/kg | PREFILLED_SYRINGE | INTRAMUSCULAR | Status: DC
  Administered 2021-07-02: 40 mg via SUBCUTANEOUS
  Filled 2021-07-02: qty 0.4

## 2021-07-02 MED ORDER — SODIUM CHLORIDE 0.9 % IV SOLN: 250.0000 mL | INTRAVENOUS | Status: DC | PRN

## 2021-07-02 MED ORDER — ONDANSETRON HCL 4 MG PO TABS: 4.0000 mg | ORAL_TABLET | Freq: Four times a day (QID) | ORAL | Status: DC | PRN

## 2021-07-02 MED ORDER — CALCIUM CARBONATE 1250 (500 CA) MG PO TABS
1.0000 | ORAL_TABLET | Freq: Every day | ORAL | Status: DC
  Administered 2021-07-02 – 2021-07-12 (×10): 500 mg via ORAL
  Filled 2021-07-02 (×12): qty 1

## 2021-07-02 MED ORDER — ONDANSETRON HCL 4 MG/2ML IJ SOLN: 4.0000 mg | Freq: Four times a day (QID) | INTRAMUSCULAR | Status: DC | PRN

## 2021-07-02 MED ORDER — PANTOPRAZOLE SODIUM 40 MG PO TBEC
40.0000 mg | DELAYED_RELEASE_TABLET | Freq: Every day | ORAL | Status: DC
  Administered 2021-07-02 – 2021-07-12 (×11): 40 mg via ORAL
  Filled 2021-07-02 (×12): qty 1

## 2021-07-02 MED ORDER — ALBUTEROL SULFATE (2.5 MG/3ML) 0.083% IN NEBU: 3.0000 mL | INHALATION_SOLUTION | Freq: Four times a day (QID) | RESPIRATORY_TRACT | Status: DC | PRN

## 2021-07-02 MED ORDER — SODIUM CHLORIDE 0.9% FLUSH: 3.0000 mL | INTRAVENOUS | Status: DC | PRN

## 2021-07-02 MED ORDER — LEVOTHYROXINE SODIUM 50 MCG PO TABS
75.0000 ug | ORAL_TABLET | Freq: Every day | ORAL | Status: DC
  Administered 2021-07-03 – 2021-07-12 (×10): 75 ug via ORAL
  Filled 2021-07-02 (×10): qty 1

## 2021-07-02 MED ORDER — MORPHINE SULFATE (PF) 4 MG/ML IV SOLN
4.0000 mg | INTRAVENOUS | Status: DC | PRN
  Administered 2021-07-02 (×2): 4 mg via INTRAVENOUS
  Filled 2021-07-02 (×2): qty 1

## 2021-07-02 MED ORDER — HYDROCODONE-ACETAMINOPHEN 5-325 MG PO TABS
1.0000 | ORAL_TABLET | Freq: Four times a day (QID) | ORAL | Status: DC | PRN
  Administered 2021-07-02 – 2021-07-12 (×25): 1 via ORAL
  Filled 2021-07-02 (×27): qty 1

## 2021-07-02 NOTE — Assessment & Plan Note (Addendum)
Continue Protonix °

## 2021-07-02 NOTE — ED Notes (Signed)
Pt contacts: ? ?Gwendolyn [name]  [address]  [phone] ? ?Family stated they live out of town and need to leave.  ?

## 2021-07-02 NOTE — Assessment & Plan Note (Addendum)
Continue Synthroid °

## 2021-07-02 NOTE — ED Provider Notes (Signed)
? ?[facility] ?Provider Note ? ? ? Event Date/Time  ? First MD Initiated Contact with Patient 07/02/21 [phone]   ?  (approximate) ? ? ?History  ? ?Fall ? ? ?HPI ? ?Gwendolyn [name] is a 76 y.o. female presents the ER for evaluation of right shoulder pain as well as left hip and knee pain that occurred after she had a fall last night.  States she was getting out of bed to get the bathroom tripped over a cord in her room.  Did not hit her head denies any neck pain.  Denies any chest pain or shortness of breath.  No palpitations no nausea or vomiting. ?  ? ? ?Physical Exam  ? ?Triage Vital Signs: ?ED Triage Vitals  ?Enc Vitals Group  ?   BP 07/02/21 0729 (!) 160/84  ?   Pulse Rate 07/02/21 0729 94  ?   Resp 07/02/21 0729 18  ?   Temp 07/02/21 0729 97.6 ?F (36.4 ?C)  ?   Temp Source 07/02/21 0729 Oral  ?   SpO2 07/02/21 0729 99 %  ?   Weight 07/02/21 0724 180 lb (81.6 kg)  ?   Height 07/02/21 0724 5' 3"  (1.6 m)  ?   Head Circumference --   ?   [place] --   ?   Pain Score 07/02/21 0724 8  ?   Pain Loc --   ?   Pain Edu? --   ?   Excl. in [place]? --   ? ? ?Most recent vital signs: ?Vitals:  ? 07/02/21 1200 07/02/21 1230  ?BP: (!) 165/88 (!) 172/90  ?Pulse: 88 87  ?Resp:  17  ?Temp:    ?SpO2: 95% 98%  ? ? ? ?Constitutional: Alert  ?Eyes: Conjunctivae are normal.  ?Head: Atraumatic. ?Nose: No congestion/rhinnorhea. ?Mouth/Throat: Mucous membranes are moist.   ?Neck: Painless ROM.  ?Cardiovascular:   Good peripheral circulation. ?Respiratory: Normal respiratory effort.  No retractions.  ?Gastrointestinal: Soft and nontender.  ?Musculoskeletal:  pain of proximal right humerus,  n/v instact distally.  Ttp of left knee with effusion,  no pelvic instability or pain ?Neurologic:  MAE spontaneously. No gross focal neurologic deficits are appreciated.  ?Skin:  Skin is warm, dry and intact. No rash noted. ?Psychiatric: Mood and affect are normal. Speech and behavior are normal. ? ? ? ?ED Results / Procedures /  Treatments  ? ?Labs ?(all labs ordered are listed, but only abnormal results are displayed) ?Labs Reviewed  ?CBC WITH DIFFERENTIAL/PLATELET - Abnormal; Notable for the following components:  ?    Result Value  ? WBC 2.9 (*)   ? RBC 3.46 (*)   ? Hemoglobin 10.4 (*)   ? HCT 33.4 (*)   ? RDW 16.4 (*)   ? Lymphs Abs 0.5 (*)   ? All other components within normal limits  ?COMPREHENSIVE METABOLIC PANEL - Abnormal; Notable for the following components:  ? Glucose, Bld 112 (*)   ? Total Protein 6.3 (*)   ? Albumin 2.7 (*)   ? AST 87 (*)   ? Total Bilirubin 1.3 (*)   ? All other components within normal limits  ?URINALYSIS, ROUTINE W REFLEX MICROSCOPIC - Abnormal; Notable for the following components:  ? Color, Urine YELLOW (*)   ? APPearance CLEAR (*)   ? pH 9.0 (*)   ? All other components within normal limits  ? ? ? ?EKG ? ? ? ? ?RADIOLOGY ?Please see ED Course for my review  and interpretation. ? ?I personally reviewed all radiographic images ordered to evaluate for the above acute complaints and reviewed radiology reports and findings.  These findings were personally discussed with the patient.  Please see medical record for radiology report. ? ? ? ?PROCEDURES: ? ?Critical Care performed: No ? ?Procedures ? ? ?MEDICATIONS ORDERED IN ED: ?Medications  ?morphine (PF) 4 MG/ML injection 4 mg (4 mg Intravenous Given 07/02/21 1236)  ?HYDROcodone-acetaminophen (NORCO/VICODIN) 5-325 MG per tablet 1 tablet (1 tablet Oral Given 07/02/21 0745)  ?ondansetron [facility]) injection 4 mg (4 mg Intravenous Given 07/02/21 0859)  ? ? ? ?IMPRESSION / MDM / ASSESSMENT AND PLAN / ED COURSE  ?I reviewed the triage vital signs and the nursing notes. ?             ?               ? ?Differential diagnosis includes, but is not limited to, fracture, contusion, dislocation, musculoskeletal strain, electrolyte abnormality, SDH, IPH ? ? ?Patient presenting with symptoms as described above.  X-rays to be ordered for the above differential we will check  blood work.  Symptoms as per purely mechanical fall no history suggesting of syncopal event. ? ?Clinical Course as of 07/02/21 1520  ?Sat Jul 02, 2021  ?0849 I reviewed right humerus x-ray and interpretation there is evidence of proximal humerus fracture with displacement.  On my review and interpretation of knee x-ray does appear to be lateral tibial plateau fracture will await formal radiology report.  My review and interpretation of x-ray hip suspect that she has fractured pubic symphysis will await formal radiology report [PR]  ?[id number] Discussed case in consultation with Dr. Roland Rack was requested CT imaging of the knee. [PR]  ?1120 Dr. Roland Rack of orthopedics is recommending nonoperative management of the fractures.  Will consult with [facility] [PR]  ?10 [facility] was unable to take patient back to their facility as they are only able to provide assisted living resources. [PR]  ?  ?Clinical Course User Index ?[PR] Merlyn Lot, MD  ? ?Case discussed in consultation with hospitalist who will admit patient to their service. ? ? ?FINAL CLINICAL IMPRESSION(S) / ED DIAGNOSES  ? ?Final diagnoses:  ?Closed fracture of left tibial plateau, initial encounter  ?Other closed displaced fracture of proximal end of right humerus, initial encounter  ? ? ? ?Rx / DC Orders  ? ?ED Discharge Orders   ? ? None  ? ?  ? ? ? ?Note:  This document was prepared using Dragon voice recognition software and may include unintentional dictation errors. ? ?  ?Merlyn Lot, MD ?07/02/21 1520 ? ?

## 2021-07-02 NOTE — Consult Note (Signed)
ORTHOPAEDIC CONSULTATION ? ?REQUESTING PHYSICIAN: Merlyn Lot, MD ? ?Chief Complaint:   ?Left knee pain and swelling. ? ?History of Present Illness: ?Gwendolyn [name] is a 76 y.o. female with multiple medical problems including hypertension, hyperlipidemia, hypothyroidism, irritable bowel syndrome, fibromyalgia, depression, cirrhosis, breast cancer, anxiety/dementia, and asthma who lives at an assisted living facility.  About 2 weeks ago the patient fell and broke her right proximal humerus.  This injury is being managed nonsurgically by Dr. Sharlet Salina at [facility] with a shoulder immobilizer.  This morning, she got up to go to the bathroom but tripped and fell over some cords, causing her to injure her left knee.  She was brought to the emergency room where x-rays and subsequent CT scanning have confirmed the presence of a mildly impacted left lateral tibial plateau fracture.  The patient denies any associated injuries.  She did not strike her head or lose consciousness.  The patient also denies any lightheadedness, dizziness, chest pain, shortness of breath, or other symptoms which may have precipitated her fall.  She denies any numbness or paresthesias down her leg to her foot. ? ?Past Medical History:  ?Diagnosis Date  ? Anxiety   ? Arthritis   ? Asthma   ? Breast cancer ([place]) 01/2010  ? left mastectomy, LN neg, on femara  ? Chronic low back pain   ? Cirrhosis of liver ([place])   ? Depression   ? Fibromyalgia   ? GERD (gastroesophageal reflux disease)   ? History of chicken pox   ? HTN (hypertension)   ? Hyperlipidemia   ? Hypothyroidism   ? IBS (irritable bowel syndrome)   ? Migraines   ? NASH (nonalcoholic steatohepatitis)   ? [facility]  ? PONV (postoperative nausea and vomiting)   ? RLS (restless legs syndrome)   ? Seasonal allergies   ? Trigeminal neuralgia   ? ?Past Surgical History:  ?Procedure Laterality Date  ? ABDOMINAL  HYSTERECTOMY    ? ANKLE SURGERY  2006  ? BREAST BIOPSY  2011  ? CA  ? CHOLECYSTECTOMY  2005  ? CYSTOCELE REPAIR    ? DILATION AND CURETTAGE OF UTERUS    ? GASTRIC BYPASS  2008  ? KNEE ARTHROSCOPY Left 09/17/2018  ? Procedure: KNEE ARTHROSCOPY WITH DEBRIDEMENT, PARTIAL MEDIAL MENISCECTOMY AND SUB-CHONDROPLASTY OF A BONE MARROW LESION INVOLVING THE MEIDAL TIBIAL PLATEAU;  Surgeon: Corky Mull, MD;  Location: [facility] ORS;  Service: Orthopedics;  Laterality: Left;  ? MASTECTOMY  2011  ? Breast Cancer  ? RECTOCELE REPAIR    ? ROTATOR CUFF REPAIR  2001  ? SEPTOPLASTY    ? TONSILLECTOMY  age 55  ? TOTAL ABDOMINAL HYSTERECTOMY W/ BILATERAL SALPINGOOPHORECTOMY  1991  ? VAGINAL DELIVERY    ? x3  ? ?Social History  ? ?Socioeconomic History  ? Marital status: Widowed  ?  Spouse name: Not on file  ? Number of children: 2  ? Years of education: Not on file  ? Highest education level: Not on file  ?Occupational History  ? 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  Employer: retired  ?Tobacco Use  ? Smoking status: Never  ? Smokeless tobacco: Never  ?Vaping Use  ? Vaping Use: Never used  ?Substance and Sexual Activity  ? Alcohol use: Not Currently  ?  Comment: Occasional  ? Drug use: No  ? Sexual activity: Not on file  ?Other Topics Concern  ? Not on file  ?Social History Narrative  ? Lives  with husband who has MS.  ?   ? Regular Exercise -  Yes, water aerobics 3 times a week  ? Daily Caffeine Use:  NO  ?   ?   ?   ? ?Social Determinants of Health  ? ?Financial Resource Strain: Not on file  ?Food Insecurity: Not on file  ?Transportation Needs: Not on file  ?Physical Activity: Not on file  ?Stress: Not on file  ?Social Connections: Not on file  ? ?Family History  ?Problem Relation Age of Onset  ? COPD Mother   ? Alzheimer's disease Father   ? Cancer Sister 85  ?     Breast  ? Arthritis Maternal Grandmother   ? Arthritis Maternal Grandfather   ? ?Allergies  ?Allergen Reactions  ? Codeine Nausea And Vomiting  ? Oxycodone Itching  ? Silver Other  (See Comments)  ?  (Tegaderm) Blisters.  Okay on hands.  ? ?Prior to Admission medications   ?Medication Sig Start Date End Date Taking? Authorizing Provider  ?albuterol (PROVENTIL HFA;VENTOLIN HFA) 108 (90 Base) MCG/ACT inhaler Inhale 2 puffs into the lungs every 6 (six) hours as needed for wheezing or shortness of breath.    [provider]  ?calcium carbonate (OS-CAL - DOSED IN MG OF ELEMENTAL CALCIUM) 1250 (500 Ca) MG tablet Take 1 tablet by mouth daily.    [provider]  ?Cholecalciferol (VITAMIN D) 50 MCG (2000 UT) tablet Take 2,000 Units by mouth daily.    [provider]  ?levothyroxine (SYNTHROID) 75 MCG tablet Take 1 tablet (75 mcg total) by mouth daily at 6 (six) AM. 12/16/20   Nolberto Hanlon, MD  ?pantoprazole (PROTONIX) 40 MG tablet Take 1 tablet (40 mg total) by mouth daily. 12/16/20   Nolberto Hanlon, MD  ? ?CT HEAD WO CONTRAST (5MM) ? ?Result Date: 07/02/2021 ?CLINICAL DATA:  Head trauma.  Fall. EXAM: CT HEAD WITHOUT CONTRAST TECHNIQUE: Contiguous axial images were obtained from the base of the skull through the vertex without intravenous contrast. RADIATION DOSE REDUCTION: This exam was performed according to the departmental dose-optimization program which includes automated exposure control, adjustment of the mA and/or kV according to patient size and/or use of iterative reconstruction technique. COMPARISON:  12/09/2020 FINDINGS: Brain: No evidence of acute infarction, hemorrhage, hydrocephalus, extra-axial collection or mass lesion/mass effect. Remote bilateral basal ganglia lacunar infarcts identified. There is ex vacuo dilatation of the anterior horn of the right lateral ventricle, image 19/6. Prominence of sulci and ventricles identified compatible with brain atrophy. Vascular: No hyperdense vessel or unexpected calcification. Skull: Normal. Negative for fracture or focal lesion. Sinuses/Orbits: No acute finding. Other: None IMPRESSION: 1. No acute intracranial  abnormalities. 2. Remote bilateral basal ganglia lacunar infarcts. Electronically Signed   By: Kerby Moors M.D.   On: 07/02/2021 10:12  ? ?CT Knee Left Wo Contrast ? ?Result Date: 07/02/2021 ?CLINICAL DATA:  Knee pain.  Stress fracture suspected. EXAM: CT OF THE LEFT KNEE WITHOUT CONTRAST TECHNIQUE: Multidetector CT imaging of the left knee was performed according to the standard protocol. Multiplanar CT image reconstructions were also generated. RADIATION DOSE REDUCTION: This exam was performed according to the departmental dose-optimization program which includes automated exposure control, adjustment of the mA and/or kV according to patient size and/or use of iterative reconstruction technique. COMPARISON:  Left knee radiographs 07/02/2021 FINDINGS: Bones/Joint/Cartilage There is diffuse decreased bone mineralization. There is a comminuted fracture of the majority of the lateral tibial plateau with up to approximately 8 mm cortical depression (sagittal series 7,  image 78). There are multiple linear fracture line lucencies extending from the superolateral aspect of the proximal tibia through the lateral aspect of the proximal tibial metaphyseal cortex. No definite medial compartment fracture line is seen. There is moderate to severe medial compartment joint space narrowing. Mild-to-moderate lateral patellofemoral joint space narrowing. Ligaments Suboptimally assessed by CT. Muscles and Tendons Normal muscle density and size. Soft tissues There is mild-to-moderate edema and swelling of the distal anterolateral thigh subcutaneous fat. Moderate knee joint effusion. Mild-to-moderate edema and swelling within the subcutaneous fat anterior to the patella and within the anterolateral proximal calf. IMPRESSION:: IMPRESSION: 1. There is a depressed fracture of the lateral tibial plateau with fracture line extending through the lateral aspect of the proximal tibial metaphysis, a "split depression" lateral tibial plateau  fracture (Schatzker II). 2. Moderate to severe medial compartment and mild-to-moderate lateral patellofemoral compartment osteoarthritis. 3. Moderate knee joint effusion. Electronically Signed   By: Viann Fish.

## 2021-07-02 NOTE — H&P (Signed)
?History and Physical  ? ? ?Patient: Gwendolyn [name] HAL:[id number] DOB: [birth year] ?DOA: 07/02/2021 ?DOS: the patient was seen and examined on 07/02/2021 ?PCP: Barbaraann Boys, MD  ?Patient coming from: Home  ? ?Chief Complaint:  ?Chief Complaint  ?Patient presents with  ? Fall  ? ?HPI: Gwendolyn [name] is a 76 y.o. female with medical history significant for hypothyroidism, hypertension, liver cirrhosis, depression, history of breast cancer status post left mastectomy, trigeminal neuralgia who presents to the ER via EMS from [facility] after an unwitnessed fall. ?Patient states that she was getting out of bed to use the bathroom when she tripped and fell.  She denies hitting her head and complained about pain in her left knee and right shoulder.  At baseline she normally ambulates with a rolling walker. ?She denies feeling dizzy or lightheaded.  She denies having any abdominal pain, no nausea, no vomiting, no changes in her bowel habits, no urinary symptoms, no cough, no fever, no chills, no leg swelling, no focal deficits or blurred vision. ?Left knee x-ray shows a comminuted tibial plateau fracture.  She also has a medially displaced and mildly comminuted proximal right humerus fracture at the junction of the head and neck. ? ? ?Review of Systems: As mentioned in the history of present illness. All other systems reviewed and are negative. ?Past Medical History:  ?Diagnosis Date  ? Anxiety   ? Arthritis   ? Asthma   ? Breast cancer ([place]) 01/2010  ? left mastectomy, LN neg, on femara  ? Chronic low back pain   ? Cirrhosis of liver ([place])   ? Depression   ? Fibromyalgia   ? GERD (gastroesophageal reflux disease)   ? History of chicken pox   ? HTN (hypertension)   ? Hyperlipidemia   ? Hypothyroidism   ? IBS (irritable bowel syndrome)   ? Migraines   ? NASH (nonalcoholic steatohepatitis)   ? Followed by [facility]. Gerald Dexter at [facility]  ? PONV (postoperative nausea and vomiting)   ? RLS (restless legs syndrome)   ?  Seasonal allergies   ? Trigeminal neuralgia   ? ?Past Surgical History:  ?Procedure Laterality Date  ? ABDOMINAL HYSTERECTOMY    ? ANKLE SURGERY  2006  ? BREAST BIOPSY  2011  ? CA  ? CHOLECYSTECTOMY  2005  ? CYSTOCELE REPAIR    ? DILATION AND CURETTAGE OF UTERUS    ? GASTRIC BYPASS  2008  ? KNEE ARTHROSCOPY Left 09/17/2018  ? Procedure: KNEE ARTHROSCOPY WITH DEBRIDEMENT, PARTIAL MEDIAL MENISCECTOMY AND SUB-CHONDROPLASTY OF A BONE MARROW LESION INVOLVING THE MEIDAL TIBIAL PLATEAU;  Surgeon: Corky Mull, MD;  Location: [facility] ORS;  Service: Orthopedics;  Laterality: Left;  ? MASTECTOMY  2011  ? Breast Cancer  ? RECTOCELE REPAIR    ? ROTATOR CUFF REPAIR  2001  ? SEPTOPLASTY    ? TONSILLECTOMY  age 19  ? TOTAL ABDOMINAL HYSTERECTOMY W/ BILATERAL SALPINGOOPHORECTOMY  1991  ? VAGINAL DELIVERY    ? x3  ? ?Social History:  reports that she has never smoked. She has never used smokeless tobacco. She reports that she does not currently use alcohol. She reports that she does not use drugs. ? ?Allergies  ?Allergen Reactions  ? Codeine Nausea And Vomiting  ? Oxycodone Itching  ? Silver Other (See Comments)  ?  (Tegaderm) Blisters.  Okay on hands.  ? ? ?Family History  ?Problem Relation Age of Onset  ? COPD Mother   ? Alzheimer's disease Father   ?  Cancer Sister 72  ?     Breast  ? Arthritis Maternal Grandmother   ? Arthritis Maternal Grandfather   ? ? ?Prior to Admission medications   ?Medication Sig Start Date End Date Taking? Authorizing Provider  ?albuterol (PROVENTIL HFA;VENTOLIN HFA) 108 (90 Base) MCG/ACT inhaler Inhale 2 puffs into the lungs every 6 (six) hours as needed for wheezing or shortness of breath.    [provider]  ?calcium carbonate (OS-CAL - DOSED IN MG OF ELEMENTAL CALCIUM) 1250 (500 Ca) MG tablet Take 1 tablet by mouth daily.    [provider]  ?Cholecalciferol (VITAMIN D) 50 MCG (2000 UT) tablet Take 2,000 Units by mouth daily.    [provider]  ?levothyroxine (SYNTHROID) 75 MCG  tablet Take 1 tablet (75 mcg total) by mouth daily at 6 (six) AM. 12/16/20   Nolberto Hanlon, MD  ?pantoprazole (PROTONIX) 40 MG tablet Take 1 tablet (40 mg total) by mouth daily. 12/16/20   Nolberto Hanlon, MD  ? ? ?Physical Exam: ?Vitals:  ? 07/02/21 1130 07/02/21 1200 07/02/21 1230 07/02/21 1533  ?BP: (!) 174/90 (!) 165/88 (!) 172/90 125/66  ?Pulse: 81 88 87 90  ?Resp: 17  17 16   ?Temp:    98.3 ?F (36.8 ?C)  ?TempSrc:    Oral  ?SpO2: 98% 95% 98% 96%  ?Weight:      ?Height:      ? ?Physical Exam ?Vitals and nursing note reviewed.  ?Constitutional:   ?   Appearance: She is normal weight.  ?HENT:  ?   Head: Normocephalic and atraumatic.  ?   Nose: Nose normal.  ?   Mouth/Throat:  ?   Mouth: Mucous membranes are moist.  ?Eyes:  ?   Pupils: Pupils are equal, round, and reactive to light.  ?Cardiovascular:  ?   Rate and Rhythm: Normal rate and regular rhythm.  ?Pulmonary:  ?   Effort: Pulmonary effort is normal.  ?   Breath sounds: Normal breath sounds.  ?Abdominal:  ?   General: Bowel sounds are normal.  ?   Palpations: Abdomen is soft.  ?   Comments: Central adiposity  ?Musculoskeletal:  ?   Cervical back: Normal range of motion and neck supple.  ?   Comments: Decreased range of motion right shoulder which is in a sling.  Left lower extremity immobilized  ?Skin: ?   General: Skin is warm and dry.  ?Neurological:  ?   General: No focal deficit present.  ?   Mental Status: She is alert.  ?Psychiatric:     ?   Mood and Affect: Mood normal.     ?   Behavior: Behavior normal.  ? ? ?Data Reviewed: ?Relevant notes from primary care and specialist visits, past discharge summaries as available in EHR, including Care Everywhere. ?Prior diagnostic testing as pertinent to current admission diagnoses ?Updated medications and problem lists for reconciliation ?ED course, including vitals, labs, imaging, treatment and response to treatment ?Triage notes, nursing and pharmacy notes and ED provider's notes ?Notable results as noted in HPI ?Labs  reviewed and essentially negative except for white count of 2.9, hemoglobin 10.4, hematocrit 33.4, total protein 6.8, albumin 3.7, AST 87, total bilirubin 1.3 ?Left knee x-ray shows a comminuted tibial plateau fracture is identified. The lateral aspect of the tibial plateau is depressed and definitely affected by the fracture.  ?Left hip x-ray shows No fractures or dislocations are identified. ?Chest x-ray reviewed by me shows no evidence of acute cardiopulmonary disease ?Right  humeral x-ray shows Medially displaced and mildly comminuted proximal right humerus ?fracture at the junction of the head and neck ?CT scan of the head without contrast shows no acute intracranial abnormalities. Remote bilateral basal ganglia lacunar infarcts. ?CT scan of the left knee shows a depressed fracture of the lateral tibial plateau with fracture line extending through the lateral aspect of the proximal tibial metaphysis, a "split depression" lateral tibial plateau ?fracture (Schatzker II). Moderate to severe medial compartment and mild-to-moderate lateral patellofemoral compartment osteoarthritis. Moderate knee joint effusion. ?There are no new results to review at this time. ? ?Assessment and Plan: ?Fall ?Patient is status post a mechanical fall and has a right humeral fracture as well as a left tibial plateau fracture. ?We will consult orthopedic surgery ?Pain control ?Immobilize left lower extremity as well as right shoulder ? ?Right humeral fracture ?Status post fall ?Immobilize right shoulder ?Pain control ?Orthopedic surgery consult ? ?Tibial plateau fracture, left ?Status post fall ?Treatment as outlined above ? ?Cirrhosis of liver ([place]) ?Patient has a history of liver cirrhosis secondary to [place]. ?Stable ? ?GERD (gastroesophageal reflux disease) ?Stable ?Continue Protonix ? ?Hypothyroidism ?Stable ?Continue Synthroid ? ?Essential hypertension, benign ?Blood pressure is stable ?Monitor closely during this  hospitalization ? ? ? ? ? Advance Care Planning:   Code Status: DNR  ? ?Consults: Orthopedic surgery, palliative care ? ?Family Communication:  ? ?Severity of Illness: ?The appropriate patient status for this patient i

## 2021-07-02 NOTE — ED Provider Triage Note (Signed)
Emergency Medicine Provider Triage Evaluation Note ? ?Gwendolyn [name] , a 76 y.o. female  was evaluated in triage.  Pt complains of left knee/left hip pain.  Patient fell when she tripped over a cord.  No head injury, no neck pain.  Has some right shoulder pain but that is from an injury a few weeks ago and has seen a doctor 2 times for the same.. ? ?Review of Systems  ?Positive: Left hip/knee pain, right shoulder pain ?Negative: Head injury, LOC, nausea/vomiting ? ?Physical Exam  ?Ht 5' 3"  (1.6 m)   Wt 81.6 kg   BMI 31.89 kg/m?  ?Gen:   Awake, no distress   ?Resp:  Normal effort  ?MSK:   Left femur tender to palpation distally, left foot is rotated outwards, right humerus is tender other:   ? ?Medical Decision Making  ?Medically screening exam initiated at 7:29 AM.  Appropriate orders placed.  Gwendolyn [name] was informed that the remainder of the evaluation will be completed by another provider, this initial triage assessment does not replace that evaluation, and the importance of remaining in the ED until their evaluation is complete. ? ?X-rays ordered ?  ?Versie Starks, PA-C ?07/02/21 0730 ? ?

## 2021-07-02 NOTE — Assessment & Plan Note (Addendum)
Status post fall ?Immobilize right shoulder ?Pain control ?-Following outpatient with orthopedic surgery ?

## 2021-07-02 NOTE — Progress Notes (Signed)
Manufacturing engineer (ACC) ? ?Gwendolyn [name] is a current hospice patient. ? ?She was sent to the ED after a fall. ? ?She is a resident at [facility], moved to the memory care side about 6 weeks ago. ? ?Her financial and HCPOA is Ivin Booty, [phone], please call her with any updates. Son Wille Glaser may be kept in the loop, but any decisions needs to be discussed with Ivin Booty. ? ? ? ?Thank you, ?Venia Carbon BSN, RN ?[facility] Liaison  ?

## 2021-07-02 NOTE — ED Triage Notes (Signed)
Pt arrives via EMS from [place] for an unwitnessed fall, no LOC, denies hitting head, VSS, cbg 121, L knee pain, normally walks with walker, pt denies blood thinner use- pt does have hx of dementia and is at baseline- pt has previous injury to R shoulder ?

## 2021-07-02 NOTE — ED Notes (Signed)
Pt noted to have urinated in bed, this tech and Kim, EDT changed linens and applied new brief and chux pad. Purewick placed on Pt by this tech. Erlene Quan, RN notified.  ?

## 2021-07-02 NOTE — Progress Notes (Addendum)
[facility] room 160 [facility]) Hospitalized Hospice Patient ? ?Ms. Gosse is a current hospice patient with a terminal diagnosis of NASH cirrhosis. On 3.18, she fell at her memory care unit and was sent to the ED for evaluation. Hospice was not notified of this until the ED called to advised. Plan was to return to [facility], however due to her limited mobility now, they are unable to have her return. The decision was made to admit her to observation status until a discharge plan could be determined. This is not a related hospital admission, per Dr. Hollace Kinnier, [facility] MD. ? ?Ms. Cutshaw is admitted with a communited tibial fracture and a medially displaced proximal humerus fracture.  ? ?Visited pt in the ED, alert and oriented to her baseline. Discussed situation several times with her [place]. Ivin Booty lives in [place] but is her Consulting civil engineer. Son Wille Glaser, lives in [place] and medical discussions can occur with him as well.  ? ?V/S: 144/80, HR 87, RR 17, SPO2 98% on RA, afebrile ?I&O: not recorded ?Diagnostics: ?Labs: albumin 2.7, AST 87, total bilirubin 1.3, WBC 2.9, RBC 3.46, hgb 10.4, HCT 33.4 ?IV/PRN: vicodin 5/325 PO x 1 for pain, zofran 4 mg IV x 1 for nausea, morphine 4 mg IV x 2 for moderate pain  ? ?Problem List: ?- communited tibial fracture and a medially displaced proximal humerus fracture - no surgical options at this time; pain treated per above ?- discharge planning - from memory care, cannot return now. POA discussed with son and he may want to explore possibility of having her dc to [place] to a facility closer to him. ? ?GOC: clear ?D/C planning: ongoing, possibly d/c to a facility in [place]; although after initial plan (return to [place] with hospice) fell through, Ivin Booty advised they could private pay LTC until [employer] process is worked through [employer] process has not even been started yet) ?Family: discussed several times with  Ivin Booty ?IDT: Hospice team updated ? ?Venia Carbon BSN, RN ?[facility] Liaison  ? ? ?**Addendum 3.25.23, this is a related hospital admission.  ?

## 2021-07-02 NOTE — Assessment & Plan Note (Addendum)
-   Currently controlled without need for medication ?

## 2021-07-02 NOTE — TOC Progression Note (Addendum)
Transition of Care (TOC) - Progression Note  ? ? ?Patient Details  ?Name: Gwendolyn [name] ?MRN: [id number] ?Date of Birth: 06-22-1945 ? ?Transition of Care (TOC) CM/SW Contact  ?[place], LCSWA ?Phone Number: ?07/02/2021, 3:30 PM ? ?Clinical Narrative:    ? ?CSW spoke [place] with hospice to touch base. Jennifer recommended calling patient's HCPOA to give update. CSW spoke to patient's financial and Hillary Bow, [phone] to let her know patient is being admitted and will not be discharged this weekend. The patient is a current hospice patient. Patient was a resident at [facility] and cannot return. Ivin Booty stated she was open to rehab options in [place] or rehab at [facility] (patient has been there previously). CSW provided weekday staff contact information. ? ?TOC will follow up to reassess needs Monday. ? ?  ?  ? ?Expected Discharge Plan and Services ?  ?  ?  ?  ?  ?                ?  ?  ?  ?  ?  ?  ?  ?  ?  ?  ? ? ?Social Determinants of Health (SDOH) Interventions ?  ? ?Readmission Risk Interventions ?Readmission Risk Prevention Plan 12/04/2020  ?Post Dischage Appt Complete  ?Medication Screening Complete  ?Transportation Screening Complete  ?Some recent data might be hidden  ? ? ?

## 2021-07-02 NOTE — Assessment & Plan Note (Addendum)
-   s/p mechanical fall ?- appreciate ortho evaluation; tentative plan is for 6-8 weeks NWB on LLE with knee immobilizer then possible TKA ?- due to this injury and R humerus fracture (managed nonsurgically also), she will be bedbound for the foreseeable future until tentative surgery for a knee replacement ?- continue attempts to get to [facility] via pivot on RLE or to recliner; will need large amount of assistance ?- cannot return to ALF due to bedbound status and will need LTC ?- her VTE risk is rather increased given 2 fractures and her being bedbound. She has been started on xarelto 10 mg daily. Discussed with orthopedic surgery too, also in agreement ?

## 2021-07-02 NOTE — TOC Progression Note (Signed)
Transition of Care (TOC) - Progression Note  ? ? ?Patient Details  ?Name: Gwendolyn [name] ?MRN: [id number] ?Date of Birth: 1945/11/01 ? ?Transition of Care (TOC) CM/SW Contact  ?[place], LCSWA ?Phone Number: ?07/02/2021, 2:31 PM ? ?Clinical Narrative:    ? ?CSW called Lattie Haw with [facility] [phone]) to see if they will accept patient back with left tib/fib fx and a right humoral head fx. CSW left voicemail. TOC will continue to follow.  ?  ?  ? ?Expected Discharge Plan and Services ?  ?  ?  ?  ?  ?                ?  ?  ?  ?  ?  ?  ?  ?  ?  ?  ? ? ?Social Determinants of Health (SDOH) Interventions ?  ? ?Readmission Risk Interventions ?Readmission Risk Prevention Plan 12/04/2020  ?Post Dischage Appt Complete  ?Medication Screening Complete  ?Transportation Screening Complete  ?Some recent data might be hidden  ? ? ?

## 2021-07-02 NOTE — Assessment & Plan Note (Addendum)
-   Patient has a history of liver cirrhosis secondary to [place]. ?-Hospice note reviewed also, she has been followed by hospice while living at [facility] care ALF ?

## 2021-07-02 NOTE — Assessment & Plan Note (Addendum)
-   high fall risk from R humerus fracture in general ?- patient now to be bedbound for several weeks  ?- continue PT for ROM exercises in bed  ?

## 2021-07-03 DIAGNOSIS — R296 Repeated falls: Secondary | ICD-10-CM

## 2021-07-03 DIAGNOSIS — K219 Gastro-esophageal reflux disease without esophagitis: Secondary | ICD-10-CM | POA: Diagnosis present

## 2021-07-03 DIAGNOSIS — Z20822 Contact with and (suspected) exposure to covid-19: Secondary | ICD-10-CM | POA: Diagnosis present

## 2021-07-03 DIAGNOSIS — E039 Hypothyroidism, unspecified: Secondary | ICD-10-CM | POA: Diagnosis present

## 2021-07-03 DIAGNOSIS — K7460 Unspecified cirrhosis of liver: Secondary | ICD-10-CM | POA: Diagnosis present

## 2021-07-03 DIAGNOSIS — Y92003 Bedroom of unspecified non-institutional (private) residence as the place of occurrence of the external cause: Secondary | ICD-10-CM | POA: Diagnosis not present

## 2021-07-03 DIAGNOSIS — K7581 Nonalcoholic steatohepatitis (NASH): Secondary | ICD-10-CM | POA: Diagnosis present

## 2021-07-03 DIAGNOSIS — Z66 Do not resuscitate: Secondary | ICD-10-CM | POA: Diagnosis present

## 2021-07-03 DIAGNOSIS — Z7401 Bed confinement status: Secondary | ICD-10-CM | POA: Diagnosis not present

## 2021-07-03 DIAGNOSIS — Z885 Allergy status to narcotic agent status: Secondary | ICD-10-CM | POA: Diagnosis not present

## 2021-07-03 DIAGNOSIS — W010XXA Fall on same level from slipping, tripping and stumbling without subsequent striking against object, initial encounter: Secondary | ICD-10-CM | POA: Diagnosis present

## 2021-07-03 DIAGNOSIS — S42211A Unspecified displaced fracture of surgical neck of right humerus, initial encounter for closed fracture: Secondary | ICD-10-CM | POA: Diagnosis present

## 2021-07-03 DIAGNOSIS — Z825 Family history of asthma and other chronic lower respiratory diseases: Secondary | ICD-10-CM | POA: Diagnosis not present

## 2021-07-03 DIAGNOSIS — Z91048 Other nonmedicinal substance allergy status: Secondary | ICD-10-CM | POA: Diagnosis not present

## 2021-07-03 DIAGNOSIS — F0394 Unspecified dementia, unspecified severity, with anxiety: Secondary | ICD-10-CM | POA: Diagnosis present

## 2021-07-03 DIAGNOSIS — Z853 Personal history of malignant neoplasm of breast: Secondary | ICD-10-CM | POA: Diagnosis not present

## 2021-07-03 DIAGNOSIS — F0393 Unspecified dementia, unspecified severity, with mood disturbance: Secondary | ICD-10-CM | POA: Diagnosis present

## 2021-07-03 DIAGNOSIS — I10 Essential (primary) hypertension: Secondary | ICD-10-CM | POA: Diagnosis present

## 2021-07-03 DIAGNOSIS — S82142A Displaced bicondylar fracture of left tibia, initial encounter for closed fracture: Secondary | ICD-10-CM

## 2021-07-03 DIAGNOSIS — E785 Hyperlipidemia, unspecified: Secondary | ICD-10-CM | POA: Diagnosis present

## 2021-07-03 DIAGNOSIS — S42214K Unspecified nondisplaced fracture of surgical neck of right humerus, subsequent encounter for fracture with nonunion: Secondary | ICD-10-CM | POA: Diagnosis not present

## 2021-07-03 DIAGNOSIS — Z8261 Family history of arthritis: Secondary | ICD-10-CM | POA: Diagnosis not present

## 2021-07-03 DIAGNOSIS — G500 Trigeminal neuralgia: Secondary | ICD-10-CM | POA: Diagnosis present

## 2021-07-03 DIAGNOSIS — M797 Fibromyalgia: Secondary | ICD-10-CM | POA: Diagnosis present

## 2021-07-03 DIAGNOSIS — Z9012 Acquired absence of left breast and nipple: Secondary | ICD-10-CM | POA: Diagnosis not present

## 2021-07-03 DIAGNOSIS — S42291A Other displaced fracture of upper end of right humerus, initial encounter for closed fracture: Secondary | ICD-10-CM | POA: Diagnosis not present

## 2021-07-03 DIAGNOSIS — M25562 Pain in left knee: Secondary | ICD-10-CM | POA: Diagnosis present

## 2021-07-03 DIAGNOSIS — Z820 Family history of epilepsy and other diseases of the nervous system: Secondary | ICD-10-CM | POA: Diagnosis not present

## 2021-07-03 DIAGNOSIS — G2581 Restless legs syndrome: Secondary | ICD-10-CM | POA: Diagnosis present

## 2021-07-03 DIAGNOSIS — Z79890 Hormone replacement therapy (postmenopausal): Secondary | ICD-10-CM | POA: Diagnosis not present

## 2021-07-03 DIAGNOSIS — W19XXXD Unspecified fall, subsequent encounter: Secondary | ICD-10-CM | POA: Diagnosis not present

## 2021-07-03 LAB — CBC
HCT: 29 % — ABNORMAL LOW (ref 36.0–46.0)
Hemoglobin: 9.2 g/dL — ABNORMAL LOW (ref 12.0–15.0)
MCH: 30.7 pg (ref 26.0–34.0)
MCHC: 31.7 g/dL (ref 30.0–36.0)
MCV: 96.7 fL (ref 80.0–100.0)
Platelets: 167 10*3/uL (ref 150–400)
RBC: 3 MIL/uL — ABNORMAL LOW (ref 3.87–5.11)
RDW: 16.7 % — ABNORMAL HIGH (ref 11.5–15.5)
WBC: 4 10*3/uL (ref 4.0–10.5)
nRBC: 0 % (ref 0.0–0.2)

## 2021-07-03 LAB — BASIC METABOLIC PANEL
Anion gap: 5 (ref 5–15)
BUN: 17 mg/dL (ref 8–23)
CO2: 26 mmol/L (ref 22–32)
Calcium: 9.4 mg/dL (ref 8.9–10.3)
Chloride: 103 mmol/L (ref 98–111)
Creatinine, Ser: 0.76 mg/dL (ref 0.44–1.00)
GFR, Estimated: >60 mL/min (ref >60)
Glucose, Bld: 88 mg/dL (ref 70–99)
Potassium: 4.4 mmol/L (ref 3.5–5.1)
Sodium: 134 mmol/L — ABNORMAL LOW (ref 135–145)

## 2021-07-03 MED ORDER — RIVAROXABAN 10 MG PO TABS
10.0000 mg | ORAL_TABLET | Freq: Every day | ORAL | Status: DC
  Administered 2021-07-03 – 2021-07-11 (×9): 10 mg via ORAL
  Filled 2021-07-03 (×9): qty 1

## 2021-07-03 NOTE — Progress Notes (Signed)
Patient ID: Gwendolyn [name], female   DOB: May 20, 1945, 76 y.o.   MRN: [id number] ? ?Subjective: ?The patient has no new complaints pertaining either to her right shoulder or to her left knee.  She feels that her pain is adequately managed at this time.  She denies any reinjury either to the knee or to the shoulder.  ? ?Objective: ?Vital signs in last 24 hours: ?Temp:  [98.1 ?F (36.7 ?C)-99.1 ?F (37.3 ?C)] 98.1 ?F (36.7 ?C) (03/19 0801) ?Pulse Rate:  [85-91] 85 (03/19 0801) ?Resp:  [15-18] 18 (03/19 0801) ?BP: (110-144)/(69-80) 132/73 (03/19 0801) ?SpO2:  [94 %-98 %] 96 % (03/19 0801) ? ?Intake/Output from previous day: ?03/18 0701 - 03/19 0700 ?In: 3 [I.V.:3] ?Out: -  ?Intake/Output this shift: ?No intake/output data recorded. ? ?Recent Labs  ?  07/02/21 ?0742 07/03/21 ?0506  ?HGB 10.4* 9.2*  ? ?Recent Labs  ?  07/02/21 ?0742 07/03/21 ?0506  ?WBC 2.9* 4.0  ?RBC 3.46* 3.00*  ?HCT 33.4* 29.0*  ?PLT 182 167  ? ?Recent Labs  ?  07/02/21 ?0742 07/03/21 ?0506  ?NA 135 134*  ?K 4.1 4.4  ?CL 102 103  ?CO2 27 26  ?BUN 14 17  ?CREATININE 0.72 0.76  ?GLUCOSE 112* 88  ?CALCIUM 9.6 9.4  ? ?No results for input(s): LABPT, INR in the last 72 hours. ? ?Physical Exam: ?Physical examination findings are unchanged as compared to yesterday.  She remains neurovascularly intact to the left lower extremity and foot.  She also has neurovascularly intact to the right upper extremity and hand. ? ?Assessment: ?1.  Mildly impacted/displaced lateral tibial plateau fracture with significant underlying degenerative joint disease, left knee. ?2.  Mildly displaced 2-part surgical neck fracture of right proximal humerus. ? ?Plan: ?The treatment options have been reviewed with the patient.  She is encouraged to work on ankle range of motion and straight leg raising in order to optimize her ankle range of motion and quadriceps muscle strength.  She understands that she will remain nonweightbearing on the left lower extremity for a minimum of 6 weeks to  allow the tibial plateau fracture to heal.  She also understands that she will require rehab placement during this time period.  She also will remain in her sling for the right proximal humerus fracture.  She may receive pain medication as deemed appropriate medically as necessary for any pain symptoms. ? ? ?Marshall Cork Aedon Deason ?07/03/2021, 3:39 PM  ? ? ? ? ?

## 2021-07-03 NOTE — Hospital Course (Signed)
Gwendolyn [name] is a 76 yo female with PMH NASH cirrhosis (was on hospice at ALF), chronic low back pain, depression/anxiety, fibromyalgia, HTN, HLD, hypothyroidism, RLS, trigeminal neuralgia, IBS who presented to the hospital after a mechanical fall. ?She underwent multiple imaging studies including brain imaging.  Findings ultimately revealed a left comminuted tibial plateau fracture.  Also a medially displaced and mildly comminuted proximal right humerus fracture.  The latter has already been known and occurred approximately 2 weeks ago and she has been managed outpatient with Dr. Sharlet Salina at [facility] and has been in a shoulder immobilizer. ?She was admitted for orthopedic surgery evaluation and pain control as well as placement as she cannot return back to her ALF with her now worsened level of functioning. ?

## 2021-07-03 NOTE — Progress Notes (Addendum)
[facility] room 160 [facility]) Hospitalized Hospice Patient ?  ?Gwendolyn [name] is a current hospice patient with a terminal diagnosis of NASH cirrhosis. On 3.18, she fell at her memory care unit and was sent to the ED for evaluation. Hospice was not notified of this until the ED called to advised. Plan was to return to [facility], however due to her limited mobility now, they are unable to have her return. The decision was made to admit her to observation status until a discharge plan could be determined. This is not a related hospital admission, per Dr. Hollace Kinnier, [facility] MD. ?Addendum: Per Dr. Jolly Mango with [facility] this is a related admission.  ?  ?Gwendolyn [name] is admitted with a communited tibial fracture and a medially displaced proximal humerus fracture.  ?  ?Visited patient in room. She is alert and oriented and reports that at this time her pain is being well controlled and she that the hospital staff is taking good care of her. Patient reports that she has not talked with Ivin Booty today but she knows they plan to re-address where she will go when she leaves the hospital tomorrow.  ?  ?V/S: 98.1/85/18   spO2 96% room air    132/73 ?I&O: not recorded ?Diagnostics: multiple, none new today ?Labs: Na+ 134, RBC 3, Hgb 9.2, Hct 29 ?IV/PRN: MSO4 76m x1, Norco [id number] hours x2  ?  ?Problem List: ?Tibial plateau fracture, left ?- due to this injury and R humerus fracture (managed nonsurgically also), she will be bedbound for the foreseeable future until tentative surgery  ?- cannot return to ALF due to bedbound status and will need SNF ?- I think her VTE risk is rather increased given 2 fractures and her being bedbound. I will place her on xarelto 10 mg daily. Discussed with orthopedic surgery too, also in agreement ?  ?Right humeral fracture ?Status post fall ?Immobilize right shoulder ?Pain control ?Orthopedic surgery consult ?  ?GOC: clear ?D/C planning: ongoing, likely to  skilled facility closer to son ?Family: did not talk with family today ?IDT: Hospice team updated ?  ?Transfer summary and Med list placed on patient's chart.  ?Please don't hesitate to call for any hospice related questions or concerns.  ?MiJhonnie GarnerBSN, RNTherapist, sportsWTNorfolk SouthernHospice hospital liaison ?[phone]

## 2021-07-03 NOTE — Progress Notes (Signed)
?Progress Note ? ? ? ?Gwendolyn [name]   ?DXI:[id number]  ?DOB: 30-Nov-1945  ?DOA: 07/02/2021     0 ?PCP: Barbaraann Boys, MD ? ?Initial CC: fall ? 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 ?She underwent multiple imaging studies including brain imaging.  Findings ultimately revealed a left comminuted tibial plateau fracture.  Also a medially displaced and mildly comminuted proximal right humerus fracture.  The latter has already been known and occurred approximately 2 weeks ago and she has been managed outpatient with Dr. Sharlet Salina at [facility] and has been in a shoulder immobilizer. ?She was admitted for orthopedic surgery evaluation and pain control as well as placement as she cannot return back to her ALF with her now worsened level of functioning. ? ?Interval History:  ?Seen in her room this morning.  She was comfortable appearing.  Obviously still having pain in right arm and left knee.  She understands plan is for prolonged bedbound status for several weeks while her knee heals. ?We will also be pursuing SNF placement since she cannot return to her ALF. ? ?Assessment and Plan: ?* Tibial plateau fracture, left ?- s/p mechanical fall ?- appreciate ortho evaluation; tentative plan is for 6-8 weeks NWB on LLE with knee immobilizer then possible TKA ?- due to this injury and R humerus fracture (managed nonsurgically also), she will be bedbound for the foreseeable future until tentative surgery  ?- cannot return to ALF due to bedbound status and will need SNF ?- I think her VTE risk is rather increased given 2 fractures and her being bedbound. I will place her on xarelto 10 mg daily. Discussed with orthopedic surgery too, also in agreement ? ?Right humeral fracture ?Status post fall ?Immobilize  right shoulder ?Pain control ?Orthopedic surgery consult ? ?Fall ?- high fall risk from R humerus fracture in general ?- patient now to be bedbound for several weeks  ?- continue PT for ROM exercises in bed  ? ?Cirrhosis of liver ([place]) ?- Patient has a history of liver cirrhosis secondary to [place]. ?-Hospice note reviewed also, she has been followed by hospice while living at [facility] ? ?GERD (gastroesophageal reflux disease) ?- Continue Protonix ? ?Hypothyroidism ?- Continue Synthroid ? ?Essential hypertension, benign ?- Currently controlled without need for medication ? ?Old records reviewed in assessment of this patient ? ?Antimicrobials: ? ? ?DVT prophylaxis:  ?rivaroxaban (XARELTO) tablet 10 mg Start: 07/03/21 2200 ?rivaroxaban (XARELTO) tablet 10 mg  ? ?Code Status:   Code Status: DNR ? ?Disposition Plan: SNF when bed avail ?Status is: Obs ? ?Objective: ?Blood pressure 132/73, pulse 85, temperature 98.1 ?F (36.7 ?C), resp. rate 18, height 5' 3"  (1.6 m), weight 81.6 kg, SpO2 96 %.  ?Examination:  ?Physical Exam ?Constitutional:   ?   General: She is not in acute distress. ?   Appearance: Normal appearance.  ?HENT:  ?   Head: Atraumatic.  ?   Mouth/Throat:  ?   Mouth: Mucous membranes are moist.  ?Eyes:  ?   Extraocular Movements: Extraocular movements intact.  ?Cardiovascular:  ?   Rate and Rhythm: Normal rate and regular rhythm.  ?Pulmonary:  ?   Effort: Pulmonary effort is normal.  ?   Breath sounds: Normal breath sounds.  ?Abdominal:  ?   General: Bowel sounds are normal. There  is no distension.  ?   Palpations: Abdomen is soft.  ?   Tenderness: There is no abdominal tenderness.  ?Musculoskeletal:  ?   Cervical back: Normal range of motion and neck supple.  ?   Comments: RUE in sling and bruising noted in upper arm. LLE in immobilizer. Compartments soft in both  ?Skin: ?   General: Skin is warm.  ?   Findings: Bruising present.  ?Neurological:  ?   General: No focal deficit present.  ?    Mental Status: She is alert.  ?Psychiatric:     ?   Mood and Affect: Mood normal.     ?   Behavior: Behavior normal.  ?  ? ?Consultants:  ?Orthopedic surgery ? ?Procedures:  ? ? ?Data Reviewed: ?Results for orders placed or performed during the hospital encounter of 07/02/21 (from the past 24 hour(s))  ?Basic metabolic panel     Status: Abnormal  ? Collection Time: 07/03/21  5:06 AM  ?Result Value Ref Range  ? Sodium 134 (L) 135 - 145 mmol/L  ? Potassium 4.4 3.5 - 5.1 mmol/L  ? Chloride 103 98 - 111 mmol/L  ? CO2 26 22 - 32 mmol/L  ? Glucose, Bld 88 70 - 99 mg/dL  ? BUN 17 8 - 23 mg/dL  ? Creatinine, Ser 0.76 0.44 - 1.00 mg/dL  ? Calcium 9.4 8.9 - 10.3 mg/dL  ? GFR, Estimated >60 >60 mL/min  ? Anion gap 5 5 - 15  ?CBC     Status: Abnormal  ? Collection Time: 07/03/21  5:06 AM  ?Result Value Ref Range  ? WBC 4.0 4.0 - 10.5 K/uL  ? RBC 3.00 (L) 3.87 - 5.11 MIL/uL  ? Hemoglobin 9.2 (L) 12.0 - 15.0 g/dL  ? HCT 29.0 (L) 36.0 - 46.0 %  ? MCV 96.7 80.0 - 100.0 fL  ? MCH 30.7 26.0 - 34.0 pg  ? MCHC 31.7 30.0 - 36.0 g/dL  ? RDW 16.7 (H) 11.5 - 15.5 %  ? Platelets 167 150 - 400 K/uL  ? nRBC 0.0 0.0 - 0.2 %  ?  ?I have Reviewed nursing notes, Vitals, and Lab results since pt's last encounter. Pertinent lab results : see above ?I have ordered test including BMP, CBC, Mg ?I have reviewed the last note from staff over past 24 hours ?I have discussed pt's care plan and test results with nursing staff, case manager ? ? LOS: 0 days  ? ?Dwyane Dee, MD ?[facility] ?07/03/2021, 11:48 AM ? ?

## 2021-07-04 DIAGNOSIS — S42214K Unspecified nondisplaced fracture of surgical neck of right humerus, subsequent encounter for fracture with nonunion: Secondary | ICD-10-CM

## 2021-07-04 DIAGNOSIS — W19XXXD Unspecified fall, subsequent encounter: Secondary | ICD-10-CM | POA: Diagnosis not present

## 2021-07-04 DIAGNOSIS — S82142A Displaced bicondylar fracture of left tibia, initial encounter for closed fracture: Secondary | ICD-10-CM | POA: Diagnosis not present

## 2021-07-04 LAB — BASIC METABOLIC PANEL
Anion gap: 4 — ABNORMAL LOW (ref 5–15)
BUN: 18 mg/dL (ref 8–23)
CO2: 29 mmol/L (ref 22–32)
Calcium: 9.4 mg/dL (ref 8.9–10.3)
Chloride: 105 mmol/L (ref 98–111)
Creatinine, Ser: 0.7 mg/dL (ref 0.44–1.00)
GFR, Estimated: >60 mL/min (ref >60)
Glucose, Bld: 89 mg/dL (ref 70–99)
Potassium: 4.2 mmol/L (ref 3.5–5.1)
Sodium: 138 mmol/L (ref 135–145)

## 2021-07-04 LAB — CBC WITH DIFFERENTIAL/PLATELET
Abs Immature Granulocytes: 0 10*3/uL (ref 0.00–0.07)
Basophils Absolute: 0 10*3/uL (ref 0.0–0.1)
Basophils Relative: 1 %
Eosinophils Absolute: 0 10*3/uL (ref 0.0–0.5)
Eosinophils Relative: 0 %
HCT: 31.4 % — ABNORMAL LOW (ref 36.0–46.0)
Hemoglobin: 9.7 g/dL — ABNORMAL LOW (ref 12.0–15.0)
Immature Granulocytes: 0 %
Lymphocytes Relative: 27 %
Lymphs Abs: 0.8 10*3/uL (ref 0.7–4.0)
MCH: 30.2 pg (ref 26.0–34.0)
MCHC: 30.9 g/dL (ref 30.0–36.0)
MCV: 97.8 fL (ref 80.0–100.0)
Monocytes Absolute: 0.4 10*3/uL (ref 0.1–1.0)
Monocytes Relative: 13 %
Neutro Abs: 1.7 10*3/uL (ref 1.7–7.7)
Neutrophils Relative %: 59 %
Platelets: 169 10*3/uL (ref 150–400)
RBC: 3.21 MIL/uL — ABNORMAL LOW (ref 3.87–5.11)
RDW: 16.8 % — ABNORMAL HIGH (ref 11.5–15.5)
WBC: 2.9 10*3/uL — ABNORMAL LOW (ref 4.0–10.5)
nRBC: 0 % (ref 0.0–0.2)

## 2021-07-04 LAB — MAGNESIUM: Magnesium: 2.3 mg/dL (ref 1.7–2.4)

## 2021-07-04 MED ORDER — LIDOCAINE 5 % EX PTCH
1.0000 | MEDICATED_PATCH | Freq: Every day | CUTANEOUS | Status: DC
  Administered 2021-07-04 – 2021-07-11 (×8): 1 via TRANSDERMAL
  Filled 2021-07-04 (×8): qty 1

## 2021-07-04 MED ORDER — FAMOTIDINE 20 MG PO TABS
20.0000 mg | ORAL_TABLET | Freq: Once | ORAL | Status: AC
  Administered 2021-07-04: 20 mg via ORAL
  Filled 2021-07-04: qty 1

## 2021-07-04 MED ORDER — HYDROCORTISONE 1 % EX CREA
1.0000 | TOPICAL_CREAM | Freq: Three times a day (TID) | CUTANEOUS | Status: DC | PRN
Start: 2021-07-04 — End: 2021-07-12
  Administered 2021-07-05 – 2021-07-11 (×5): 1 via TOPICAL
  Filled 2021-07-04 (×2): qty 28

## 2021-07-04 MED ORDER — DIPHENHYDRAMINE HCL 50 MG/ML IJ SOLN
25.0000 mg | Freq: Once | INTRAMUSCULAR | Status: AC
  Administered 2021-07-04: 25 mg via INTRAVENOUS
  Filled 2021-07-04: qty 1

## 2021-07-04 NOTE — [place] FL2 (Signed)
? MEDICAID FL2 LEVEL OF CARE SCREENING TOOL  ?  ? ?IDENTIFICATION  ?Patient Name: ?Gwendolyn [name] Birthdate: [birth year] Sex: female Admission Date (Current Location): ?07/02/2021  ?[place] and [place] Number: ? [place] ?  Facility and Address:  ?[facility], [address], [place], [place] [address] ?     Provider Number: ?[id number]  ?Attending Physician Name and Address:  ?Dwyane Dee, MD ? Relative Name and Phone Number:  ?Ivin Booty Legal guardian [phone] ?   ?Current Level of Care: ?Hospital Recommended Level of Care: ?[place] Prior Approval Number: ?  ? ?Date Approved/Denied: ?  PASRR Number: ?  ? ?Discharge Plan: ?  ?  ? ?Current Diagnoses: ?Patient Active Problem List  ? Diagnosis Date Noted  ? Recurrent falls 07/03/2021  ? Fall 07/02/2021  ? Right humeral fracture 07/02/2021  ? Tibial plateau fracture, left 07/02/2021  ? Morbidly obese ([place]) 12/11/2020  ? Hypernatremia 12/10/2020  ? Thrombocytopenia ([place]) 12/10/2020  ? Acute respiratory failure with hypoxia ([place]) 12/02/2020  ? Acute metabolic encephalopathy 90/30/0923  ? Cirrhosis of liver ([place])   ? Cerebrovascular disease   ? ARF (acute renal failure) ([place]) 01/09/2018  ? Chronic venous insufficiency 11/28/2017  ? Lymphedema 11/28/2017  ? Leg pain 11/28/2017  ? GERD (gastroesophageal reflux disease) 11/28/2017  ? Hypothyroidism 12/10/2013  ? Medicare annual wellness visit, subsequent 07/08/2013  ? Weakness of both legs 07/08/2013  ? Night sweats 01/16/2013  ? Tremor 01/16/2013  ? Palpitations 01/16/2013  ? Fibromyalgia 07/22/2012  ? Essential hypertension, benign 06/19/2012  ? Bereavement 06/19/2012  ? Insomnia 03/28/2012  ? Lumbar back pain 12/22/2011  ? HX: breast cancer 09/18/2011  ? Abnormal CT lung screening 08/07/2011  ? NASH (nonalcoholic steatohepatitis) 06/12/2011  ? ? ?Orientation RESPIRATION BLADDER Height & Weight   ?  ?Self, Place ? Normal Continent Weight: 81.6 kg ?Height:  5'  3" (160 cm)  ?BEHAVIORAL SYMPTOMS/MOOD NEUROLOGICAL BOWEL NUTRITION STATUS  ?    Continent Diet (see dc summary)  ?AMBULATORY STATUS COMMUNICATION OF NEEDS Skin   ?Extensive Assist Verbally Normal ?  ?  ?  ?    ?     ?     ? ? ?Personal Care Assistance Level of Assistance  ?Bathing, Feeding, Dressing Bathing Assistance: Maximum assistance ?Feeding assistance: Limited assistance ?Dressing Assistance: Maximum assistance ?   ? ?Functional Limitations Info  ?    ?  ?   ? ? ?SPECIAL CARE FACTORS FREQUENCY  ?    ?  ?  ?  ?  ?  ?  ?   ? ? ?Contractures    ? ? ?Additional Factors Info  ?Code Status, Allergies Code Status Info: DNR ?Allergies Info: Codeine, Oxycodone, Silver ?  ?  ?  ?   ? ?Current Medications (07/04/2021):  This is the current hospital active medication list ?Current Facility-Administered Medications  ?Medication Dose Route Frequency Provider Last Rate Last Admin  ? 0.9 %  sodium chloride infusion  250 mL Intravenous PRN Agbata, Tochukwu, MD      ? albuterol (PROVENTIL) (2.5 MG/3ML) 0.083% nebulizer solution 3 mL  3 mL Inhalation Q6H PRN Agbata, Tochukwu, MD      ? calcium carbonate (OS-CAL - dosed in mg of elemental calcium) tablet 500 mg of elemental calcium  1 tablet Oral Daily Agbata, Tochukwu, MD   500 mg of elemental calcium at 07/04/21 0954  ? cholecalciferol (VITAMIN D3) tablet 2,000 Units  2,000 Units Oral Daily Agbata, Tochukwu,  MD   2,000 Units at 07/04/21 0953  ? HYDROcodone-acetaminophen (NORCO/VICODIN) 5-325 MG per tablet 1 tablet  1 tablet Oral Q6H PRN Agbata, Tochukwu, MD   1 tablet at 07/04/21 0523  ? levothyroxine (SYNTHROID) tablet 75 mcg  75 mcg Oral Q0600 Collier Bullock, MD   75 mcg at 07/04/21 0514  ? ondansetron (ZOFRAN) tablet 4 mg  4 mg Oral Q6H PRN Agbata, Tochukwu, MD      ? Or  ? ondansetron (ZOFRAN) injection 4 mg  4 mg Intravenous Q6H PRN Agbata, Tochukwu, MD      ? pantoprazole (PROTONIX) EC tablet 40 mg  40 mg Oral Daily Agbata, Tochukwu, MD   40 mg at 07/04/21 0953  ?  rivaroxaban (XARELTO) tablet 10 mg  10 mg Oral Daily Dwyane Dee, MD   10 mg at 07/03/21 2040  ? sodium chloride flush (NS) 0.9 % injection 3 mL  3 mL Intravenous Q12H Agbata, Tochukwu, MD   3 mL at 07/04/21 0956  ? sodium chloride flush (NS) 0.9 % injection 3 mL  3 mL Intravenous PRN Agbata, Tochukwu, MD      ? ? ? ?Discharge Medications: ?Please see discharge summary for a list of discharge medications. ? ?Relevant Imaging Results: ? ?Relevant Lab Results: ? ? ?Additional Information ?SS #: [phone] ? ?Conception Oms, RN ? ? ? ? ?

## 2021-07-04 NOTE — Evaluation (Signed)
Physical Therapy Evaluation ?Patient Details ?Name: Gwendolyn [name] ?MRN: [id number] ?DOB: 1945-10-04 ?Today's Date: 07/04/2021 ? ?History of Present Illness ? Pt is a 76 y.o. female with medical history significant for hypothyroidism, hypertension, liver cirrhosis, depression, history of breast cancer status post left mastectomy, trigeminal neuralgia who presents to the ER via EMS after an unwitnessed fall. MD assessement includes left tibial plateau fracture and right humeral fracture both to be managed conservatively. ?  ?Clinical Impression ? Pt required min to mod encouragement pt participate during the session with education provided on physiological benefits of activity.  Pt required physical assistance with bed mobility tasks and sit to/from stand transfers.  Pt was able to come to near full upright standing position with good LLE WB compliance when bed was moved close to the sink and the pt was able to use her LUE to assist with pulling up to standing.  Max standing tolerance during each of her 5 stands was minimal, grossly 3-5 sec.  Pt would not be safe to return to her prior living situation at this time.  Pt will benefit from PT services in a SNF setting upon discharge to safely address deficits listed in patient problem list for decreased caregiver assistance and eventual return to PLOF. ?   ?   ? ?Recommendations for follow up therapy are one component of a multi-disciplinary discharge planning process, led by the attending physician.  Recommendations may be updated based on patient status, additional functional criteria and insurance authorization. ? ?Follow Up Recommendations Skilled nursing-short term rehab (<3 hours/day) ? ?  ?Assistance Recommended at Discharge Frequent or constant Supervision/Assistance  ?Patient can return home with the following ? Two people to help with walking and/or transfers;Two people to help with bathing/dressing/bathroom;Direct supervision/assist for medications  management;Assistance with cooking/housework;Direct supervision/assist for financial management;Assist for transportation ? ?  ?Equipment Recommendations Other (comment) (TBD)  ?Recommendations for Other Services ?    ?  ?Functional Status Assessment Patient has had a recent decline in their functional status and demonstrates the ability to make significant improvements in function in a reasonable and predictable amount of time.  ? ?  ?Precautions / Restrictions Precautions ?Precautions: Fall ?Required Braces or Orthoses: Knee Immobilizer - Left ?Restrictions ?Weight Bearing Restrictions: Yes ?RUE Weight Bearing: Non weight bearing ?LLE Weight Bearing: Non weight bearing ?Other Position/Activity Restrictions: OK for LLE quad sets per Dr. Roland Rack  ? ?  ? ?Mobility ? Bed Mobility ?Overal bed mobility: Needs Assistance ?Bed Mobility: Supine to Sit, Sit to Supine ?  ?  ?Supine to sit: Min assist, Mod assist ?Sit to supine: Min assist, Mod assist ?  ?General bed mobility comments: Min to mod A for LLE and trunk control during sup to/from sit ?  ? ?Transfers ?Overall transfer level: Needs assistance ?  ?Transfers: Sit to/from Stand ?Sit to Stand: Min assist, From elevated surface ?  ?  ?  ?  ?  ?General transfer comment: Pt able to come to near full upright standing position x 5 with LUE pulling from sink; mod verbal cues for LLE WB compliance ?  ? ?Ambulation/Gait ?  ?  ?  ?  ?  ?  ?  ?General Gait Details: unable/unsafe to attempt ? ?Stairs ?  ?  ?  ?  ?  ? ?Wheelchair Mobility ?  ? ?Modified Rankin (Stroke Patients Only) ?  ? ?  ? ?Balance Overall balance assessment: Needs assistance ?  ?Sitting balance-Leahy Scale: Good ?  ?  ?  ?  ?  ?  ?  ?  ?  ?  ?  ?  ?  ?  ?  ?  ?   ? ? ? ?  Pertinent Vitals/Pain Pain Assessment ?Pain Assessment: 0-10 ?Pain Score: 6  ?Pain Location: RUE ?Pain Descriptors / Indicators: Sore, Grimacing ?Pain Intervention(s): Repositioned, Premedicated before session, Monitored during session  ? ? ?Home  Living Family/patient expects to be discharged to:: Private residence ?Living Arrangements: Non-relatives/Friends ?Available Help at Discharge: Other (Comment) (Pt lives at [facility] an [facility] [address]) ?Type of Home: Independent living facility ?Home Access: Level entry;Elevator ?  ?  ?  ?Home Layout: One level ?Home Equipment: Shower seat - built in;Grab bars - toilet;Grab bars - tub/shower ?Additional Comments: Pt is a resident at [facility], has a walker but unsure of the type  ?  ?Prior Function Prior Level of Function : Independent/Modified Independent ?  ?  ?  ?  ?  ?  ?Mobility Comments: Mod Ind amb with a walker (unsure of type) facility distances, stayed on campus, no other fall history other than admitting fall ?ADLs Comments: Assist from [facility] staff for medications but Ind with all other ADLs ?  ? ? ?Hand Dominance  ?   ? ?  ?Extremity/Trunk Assessment  ? Upper Extremity Assessment ?Upper Extremity Assessment: Generalized weakness;RUE deficits/detail ?RUE: Unable to fully assess due to pain;Unable to fully assess due to immobilization ?  ? ?Lower Extremity Assessment ?Lower Extremity Assessment: Generalized weakness;LLE deficits/detail ?LLE: Unable to fully assess due to pain;Unable to fully assess due to immobilization ?  ? ?   ?Communication  ? Communication: No difficulties  ?Cognition Arousal/Alertness: Awake/alert ?Behavior During Therapy: [facility] for tasks assessed/performed ?Overall Cognitive Status: No family/caregiver present to determine baseline cognitive functioning ?  ?  ?  ?  ?  ?  ?  ?  ?  ?  ?  ?  ?  ?  ?  ?  ?General Comments: Some minor difficulties recalling details during history ?  ?  ? ?  ?General Comments   ? ?  ?Exercises Total Joint Exercises ?Ankle Circles/Pumps: AROM, Strengthening, Both, 5 reps, 10 reps ?Quad Sets: Strengthening, Both, 5 reps, 10 reps ?Gluteal Sets: Strengthening, Both, 5 reps, 10 reps ?Heel Slides: AROM, Strengthening, Right, 5 reps ?Hip  ABduction/ADduction: AAROM, Strengthening, AROM, Both, 10 reps, 5 reps (AAROM on the LLE) ?Straight Leg Raises: AROM, AAROM, Strengthening, Both, 5 reps, 10 reps (AAROM on the LLE) ?Long Arc Quad: AROM, Strengthening, Right, 5 reps, 10 reps ?Knee Flexion: AROM, Strengthening, Right, 5 reps, 10 reps ?Other Exercises ?Other Exercises: HEP education for BLE APs, QS, and GS x 10 each every 1-2 hours daily  ? ?Assessment/Plan  ?  ?PT Assessment Patient needs continued PT services  ?PT Problem List Decreased strength;Decreased activity tolerance;Decreased balance;Decreased mobility;Decreased knowledge of use of DME;Decreased knowledge of precautions;Pain ? ?   ?  ?PT Treatment Interventions DME instruction;Functional mobility training;Therapeutic activities;Therapeutic exercise;Balance training;Patient/family education   ? ?PT Goals (Current goals can be found in the Care Plan section)  ?Acute Rehab PT Goals ?Patient Stated Goal: To be as independent as I can be ?PT Goal Formulation: With patient ?Time For Goal Achievement: 07/17/21 ?Potential to Achieve Goals: Good ? ?  ?Frequency Min 2X/week ?  ? ? ?Co-evaluation   ?  ?  ?  ?  ? ? ?  ?AM-PAC PT "6 Clicks" Mobility  ?Outcome Measure Help needed turning from your back to your side while in a flat bed without using bedrails?: A Little ?Help needed moving from lying on your back to sitting on the side of a flat bed without using  bedrails?: A Lot ?Help needed moving to and from a bed to a chair (including a wheelchair)?: A Lot ?Help needed standing up from a chair using your arms (e.g., wheelchair or bedside chair)?: A Lot ?Help needed to walk in hospital room?: Total ?Help needed climbing 3-5 steps with a railing? : Total ?6 Click Score: 11 ? ?  ?End of Session Equipment Utilized During Treatment: Gait belt ?Activity Tolerance: Patient tolerated treatment well ?Patient left: in bed;with call bell/phone within reach;with bed alarm set;with nursing/sitter in room ?Nurse  Communication: Mobility status;Weight bearing status ?PT Visit Diagnosis: History of falling (Z91.81);Difficulty in walking, not elsewhere classified (R26.2);Muscle weakness (generalized) (M62.81);Pain ?Pain - Right/Left:

## 2021-07-04 NOTE — Discharge Instructions (Signed)
Information on my medicine - XARELTO? (Rivaroxaban) ? ?This medication education was reviewed with me or my healthcare representative as part of my discharge preparation.   ? ?Why was Xarelto? prescribed for you? ?Xarelto? was prescribed for you to reduce the risk of blood clots forming after orthopedic surgery. The medical term for these abnormal blood clots is venous thromboembolism (VTE). ? ?What do you need to know about xarelto? ? ?Take your Xarelto? ONCE DAILY at the same time every day. ?You may take it either with or without food. ? ?If you have difficulty swallowing the tablet whole, you may crush it and mix in applesauce just prior to taking your dose. ? ?Take Xarelto? exactly as prescribed by your doctor and DO NOT stop taking Xarelto? without talking to the doctor who prescribed the medication.  Stopping without other VTE prevention medication to take the place of Xarelto? may increase your risk of developing a clot. ? ?After discharge, you should have regular check-up appointments with your healthcare provider that is prescribing your Xarelto?.   ? ?What do you do if you miss a dose? ?If you miss a dose, take it as soon as you remember on the same day then continue your regularly scheduled once daily regimen the next day. Do not take two doses of Xarelto? on the same day.  ? ?Important Safety Information ?A possible side effect of Xarelto? is bleeding. You should call your healthcare provider right away if you experience any of the following: ?Bleeding from an injury or your nose that does not stop. ?Unusual colored urine (red or dark brown) or unusual colored stools (red or black). ?Unusual bruising for unknown reasons. ?A serious fall or if you hit your head (even if there is no bleeding). ? ?Some medicines may interact with Xarelto? and might increase your risk of bleeding while on Xarelto?Marland Kitchen To help avoid this, consult your healthcare provider or pharmacist prior to using any new prescription or  non-prescription medications, including herbals, vitamins, non-steroidal anti-inflammatory drugs (NSAIDs) and supplements. ? ?This website has more information on Xarelto?: [email or web address] ?  ?

## 2021-07-04 NOTE — Progress Notes (Signed)
[facility] Liaison Note ? ?Tawni Millers, HCPOA for Ms. Loeber opted to revoke the Hospice Medicare Benefit today. Patient is not able to return to her facility for care at this time as she is requiring a higher level of care than they can provide. Ms. How does not meet GIP criteria and Ms. Stock would like for Medicare to cover the hospitalization. ? ?Ms. Stock is aware that hospice can be reelected if patient is determined to be eligible. Ms.Stock shared she is asking for patient to be moved to a facility in [place], near her son. This would be out of our service area, though she was assured that we would help with the hospice transition if this is where Ms. Embry should relocate. ? ?Please call with any hospice related questions or concerns. ? ?Thank you, ?Margaretmary Eddy, BSN, RN ?[facility] Liaison ?[phone] ?

## 2021-07-04 NOTE — Progress Notes (Signed)
?Progress Note ? ? ? ?Gwendolyn [name]   ?WJX:[id number]  ?DOB: [birth year]  ?DOA: 07/02/2021     1 ?PCP: Barbaraann Boys, MD ? ?Initial CC: fall ? ?Hospital Course: ?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 ?She underwent multiple imaging studies including brain imaging.  Findings ultimately revealed a left comminuted tibial plateau fracture.  Also a medially displaced and mildly comminuted proximal right humerus fracture.  The latter has already been known and occurred approximately 2 weeks ago and she has been managed outpatient with Dr. Sharlet Salina at [facility] and has been in a shoulder immobilizer. ?She was admitted for orthopedic surgery evaluation and pain control as well as placement as she cannot return back to her ALF with her now worsened level of functioning. ? ?Interval History:  ?No events overnight. Pain still present but relatively controlled.  ? ?Assessment and Plan: ?* Tibial plateau fracture, left ?- s/p mechanical fall ?- appreciate ortho evaluation; tentative plan is for 6-8 weeks NWB on LLE with knee immobilizer then possible TKA ?- due to this injury and R humerus fracture (managed nonsurgically also), she will be bedbound for the foreseeable future until tentative surgery for a knee replacement ?- cannot return to ALF due to bedbound status and will need LTC ?- her VTE risk is rather increased given 2 fractures and her being bedbound. I will place her on xarelto 10 mg daily. Discussed with orthopedic surgery too, also in agreement ? ?Right humeral fracture ?Status post fall ?Immobilize right shoulder ?Pain control ?-Following outpatient with orthopedic surgery ? ?Fall ?- high fall risk from R humerus fracture in general ?- patient now to be bedbound for several weeks  ?- continue PT for ROM  exercises in bed  ? ?Cirrhosis of liver ([place]) ?- Patient has a history of liver cirrhosis secondary to [place]. ?-Hospice note reviewed also, she has been followed by hospice while living at [facility] care ALF ? ?GERD (gastroesophageal reflux disease) ?- Continue Protonix ? ?Hypothyroidism ?- Continue Synthroid ? ?Essential hypertension, benign ?- Currently controlled without need for medication ? ?Old records reviewed in assessment of this patient ? ?Antimicrobials: ? ? ?DVT prophylaxis:  ?rivaroxaban (XARELTO) tablet 10 mg Start: 07/03/21 2200 ?rivaroxaban (XARELTO) tablet 10 mg  ? ?Code Status:   Code Status: DNR ? ?Disposition Plan: SNF/LTC when bed avail ?Status is: Inpt ? ?Objective: ?Blood pressure 139/70, pulse 92, temperature 98.9 ?F (37.2 ?C), temperature source Temporal, resp. rate 16, height 5' 3"  (1.6 m), weight 81.6 kg, SpO2 97 %.  ?Examination:  ?Physical Exam ?Constitutional:   ?   General: She is not in acute distress. ?   Appearance: Normal appearance.  ?HENT:  ?   Head: Atraumatic.  ?   Mouth/Throat:  ?   Mouth: Mucous membranes are moist.  ?Eyes:  ?   Extraocular Movements: Extraocular movements intact.  ?Cardiovascular:  ?   Rate and Rhythm: Normal rate and regular rhythm.  ?Pulmonary:  ?   Effort: Pulmonary effort is normal.  ?   Breath sounds: Normal breath sounds.  ?Abdominal:  ?   General: Bowel sounds are normal. There is no distension.  ?   Palpations: Abdomen is soft.  ?   Tenderness: There is no abdominal tenderness.  ?Musculoskeletal:  ?   Cervical back: Normal range of motion and neck supple.  ?  Comments: RUE in sling and bruising noted in upper arm. LLE in immobilizer. Compartments soft in both  ?Skin: ?   General: Skin is warm.  ?   Findings: Bruising present.  ?Neurological:  ?   General: No focal deficit present.  ?   Mental Status: She is alert.  ?Psychiatric:     ?   Mood and Affect: Mood normal.     ?   Behavior: Behavior normal.  ?  ? ?Consultants:  ?Orthopedic  surgery ? ?Procedures:  ? ? ?Data Reviewed: ?Results for orders placed or performed during the hospital encounter of 07/02/21 (from the past 24 hour(s))  ?Basic metabolic panel     Status: Abnormal  ? Collection Time: 07/04/21  5:34 AM  ?Result Value Ref Range  ? Sodium 138 135 - 145 mmol/L  ? Potassium 4.2 3.5 - 5.1 mmol/L  ? Chloride 105 98 - 111 mmol/L  ? CO2 29 22 - 32 mmol/L  ? Glucose, Bld 89 70 - 99 mg/dL  ? BUN 18 8 - 23 mg/dL  ? Creatinine, Ser 0.70 0.44 - 1.00 mg/dL  ? Calcium 9.4 8.9 - 10.3 mg/dL  ? GFR, Estimated >60 >60 mL/min  ? Anion gap 4 (L) 5 - 15  ?CBC with Differential/Platelet     Status: Abnormal  ? Collection Time: 07/04/21  5:34 AM  ?Result Value Ref Range  ? WBC 2.9 (L) 4.0 - 10.5 K/uL  ? RBC 3.21 (L) 3.87 - 5.11 MIL/uL  ? Hemoglobin 9.7 (L) 12.0 - 15.0 g/dL  ? HCT 31.4 (L) 36.0 - 46.0 %  ? MCV 97.8 80.0 - 100.0 fL  ? MCH 30.2 26.0 - 34.0 pg  ? MCHC 30.9 30.0 - 36.0 g/dL  ? RDW 16.8 (H) 11.5 - 15.5 %  ? Platelets 169 150 - 400 K/uL  ? nRBC 0.0 0.0 - 0.2 %  ? Neutrophils Relative % 59 %  ? Neutro Abs 1.7 1.7 - 7.7 K/uL  ? Lymphocytes Relative 27 %  ? Lymphs Abs 0.8 0.7 - 4.0 K/uL  ? Monocytes Relative 13 %  ? Monocytes Absolute 0.4 0.1 - 1.0 K/uL  ? Eosinophils Relative 0 %  ? Eosinophils Absolute 0.0 0.0 - 0.5 K/uL  ? Basophils Relative 1 %  ? Basophils Absolute 0.0 0.0 - 0.1 K/uL  ? Immature Granulocytes 0 %  ? Abs Immature Granulocytes 0.00 0.00 - 0.07 K/uL  ?Magnesium     Status: None  ? Collection Time: 07/04/21  5:34 AM  ?Result Value Ref Range  ? Magnesium 2.3 1.7 - 2.4 mg/dL  ?  ?I have Reviewed nursing notes, Vitals, and Lab results since pt's last encounter. Pertinent lab results : see above ?I have ordered test including BMP, CBC, Mg ?I have reviewed the last note from staff over past 24 hours ?I have discussed pt's care plan and test results with nursing staff, case manager ? ? LOS: 1 day  ? ?Dwyane Dee, MD ?[facility] ?07/04/2021, 2:51 PM ? ?

## 2021-07-04 NOTE — TOC Progression Note (Signed)
Transition of Care (TOC) - Progression Note  ? ? ?Patient Details  ?Name: KRESTA TEMPLEMAN ?MRN: [id number] ?Date of Birth: March 09, 1946 ? ?Transition of Care (TOC) CM/SW Contact  ?Conception Oms, RN ?Phone Number: ?07/04/2021, 10:33 AM ? ?Clinical Narrative:    ? ? ?Spoke with Tawni Millers the legal guardian, She called me and stated that she needs to move the patient to be close to her son into a long term facility or family care home in [place], She is agreeable to work with [employer] due to the distance, they will medically transport the patient with first choice ems ?  ? Ivin Booty provided the phone number for the son [name] and gave permission for him to speak with me or [facility] [employer] [phone] ? I called and spoke with Andee Poles and provided the contact information, secure emailed the clinical notes ? ?Expected Discharge Plan and Services ?  ?  ?  ?  ?  ?                ?  ?  ?  ?  ?  ?  ?  ?  ?  ?  ? ? ?Social Determinants of Health (SDOH) Interventions ?  ? ?Readmission Risk Interventions ?Readmission Risk Prevention Plan 12/04/2020  ?Post Dischage Appt Complete  ?Medication Screening Complete  ?Transportation Screening Complete  ?Some recent data might be hidden  ? ? ?

## 2021-07-04 NOTE — Progress Notes (Signed)
Patient ID: Gwendolyn [name], female   DOB: 26-May-1945, 76 y.o.   MRN: [id number] ? ?Subjective: ?Overall, the patient feels that she is doing reasonably well.  She still notes moderate pain in the leg with certain motions, as well as in the shoulder, to a lesser extent.  She does not have any new complaints.  ? ?Objective: ?Vital signs in last 24 hours: ?Temp:  [97.9 ?F (36.6 ?C)-98.9 ?F (37.2 ?C)] 98.9 ?F (37.2 ?C) (03/20 1129) ?Pulse Rate:  [76-92] 92 (03/20 1129) ?Resp:  [16-18] 16 (03/20 1129) ?BP: (119-139)/(61-70) 139/70 (03/20 1129) ?SpO2:  [94 %-97 %] 97 % (03/20 0800) ? ?Intake/Output from previous day: ?03/19 0701 - 03/20 0700 ?In: 240 [P.O.:240] ?Out: 8325 [Urine:1650] ?Intake/Output this shift: ?Total I/O ?In: 483 [P.O.:480; I.V.:3] ?Out: 550 [Urine:550] ? ?Recent Labs  ?  07/02/21 ?0742 07/03/21 ?0506 07/04/21 ?4982  ?HGB 10.4* 9.2* 9.7*  ? ?Recent Labs  ?  07/03/21 ?0506 07/04/21 ?6415  ?WBC 4.0 2.9*  ?RBC 3.00* 3.21*  ?HCT 29.0* 31.4*  ?PLT 167 169  ? ?Recent Labs  ?  07/03/21 ?0506 07/04/21 ?8309  ?NA 134* 138  ?K 4.4 4.2  ?CL 103 105  ?CO2 26 29  ?BUN 17 18  ?CREATININE 0.76 0.70  ?GLUCOSE 88 89  ?CALCIUM 9.4 9.4  ? ?No results for input(s): LABPT, INR in the last 72 hours. ? ?Physical Exam: ?Orthopedic examination again is limited to the left lower extremity and foot, as well as to the right shoulder and upper extremity.  The findings are as described previously.  She is neurovascularly intact to the left lower extremity, as well as to the right upper extremity. ? ?Assessment: ?1.  Mildly impacted/displaced lateral tibial plateau fracture with significant underlying degenerative joint disease, left knee. ?2.  Mildly displaced 2-part surgical neck fracture of right proximal humerus. ? ?Plan: ?The treatment options again are reviewed with the patient.  She may continue with her bed exercises in order to most optimally maintain her ankle range of motion and leg strength.  She may continue to receive  pain medication as deemed appropriate medically for any pain she is experiencing.  She will go to rehab when a bed is available. ? ?Thank you for asking me to participate in the care of this most pleasant yet unfortunate woman.  I will sign off at this time.  If you have further need of orthopedic input during this hospitalization, please reconsult me.  As I mentioned in my original operative note.  She has the choice of either following up with me or with Dr. Sharlet Salina in about 2 weeks for these injuries. ? ? ?Marshall Cork Drianna Chandran ?07/04/2021, 2:37 PM  ? ? ? ? ?

## 2021-07-05 DIAGNOSIS — S42291A Other displaced fracture of upper end of right humerus, initial encounter for closed fracture: Secondary | ICD-10-CM

## 2021-07-05 DIAGNOSIS — I10 Essential (primary) hypertension: Secondary | ICD-10-CM | POA: Diagnosis not present

## 2021-07-05 DIAGNOSIS — K7460 Unspecified cirrhosis of liver: Secondary | ICD-10-CM

## 2021-07-05 DIAGNOSIS — S82142A Displaced bicondylar fracture of left tibia, initial encounter for closed fracture: Secondary | ICD-10-CM | POA: Diagnosis not present

## 2021-07-05 LAB — CBC WITH DIFFERENTIAL/PLATELET
Abs Immature Granulocytes: 0.01 10*3/uL (ref 0.00–0.07)
Basophils Absolute: 0 10*3/uL (ref 0.0–0.1)
Basophils Relative: 1 %
Eosinophils Absolute: 0 10*3/uL (ref 0.0–0.5)
Eosinophils Relative: 0 %
HCT: 29.7 % — ABNORMAL LOW (ref 36.0–46.0)
Hemoglobin: 9.3 g/dL — ABNORMAL LOW (ref 12.0–15.0)
Immature Granulocytes: 0 %
Lymphocytes Relative: 27 %
Lymphs Abs: 0.7 10*3/uL (ref 0.7–4.0)
MCH: 30.5 pg (ref 26.0–34.0)
MCHC: 31.3 g/dL (ref 30.0–36.0)
MCV: 97.4 fL (ref 80.0–100.0)
Monocytes Absolute: 0.4 10*3/uL (ref 0.1–1.0)
Monocytes Relative: 14 %
Neutro Abs: 1.5 10*3/uL — ABNORMAL LOW (ref 1.7–7.7)
Neutrophils Relative %: 58 %
Platelets: 152 10*3/uL (ref 150–400)
RBC: 3.05 MIL/uL — ABNORMAL LOW (ref 3.87–5.11)
RDW: 16.6 % — ABNORMAL HIGH (ref 11.5–15.5)
WBC: 2.6 10*3/uL — ABNORMAL LOW (ref 4.0–10.5)
nRBC: 0 % (ref 0.0–0.2)

## 2021-07-05 LAB — MAGNESIUM: Magnesium: 2 mg/dL (ref 1.7–2.4)

## 2021-07-05 LAB — BASIC METABOLIC PANEL
Anion gap: 3 — ABNORMAL LOW (ref 5–15)
BUN: 17 mg/dL (ref 8–23)
CO2: 27 mmol/L (ref 22–32)
Calcium: 9.3 mg/dL (ref 8.9–10.3)
Chloride: 108 mmol/L (ref 98–111)
Creatinine, Ser: 0.69 mg/dL (ref 0.44–1.00)
GFR, Estimated: >60 mL/min (ref >60)
Glucose, Bld: 97 mg/dL (ref 70–99)
Potassium: 4.1 mmol/L (ref 3.5–5.1)
Sodium: 138 mmol/L (ref 135–145)

## 2021-07-05 MED ORDER — DIPHENHYDRAMINE HCL 25 MG PO CAPS
25.0000 mg | ORAL_CAPSULE | Freq: Once | ORAL | Status: AC
  Administered 2021-07-05: 25 mg via ORAL
  Filled 2021-07-05: qty 1

## 2021-07-05 MED ORDER — MELATONIN 5 MG PO TABS
2.5000 mg | ORAL_TABLET | Freq: Every day | ORAL | Status: DC
  Administered 2021-07-05 – 2021-07-11 (×7): 2.5 mg via ORAL
  Filled 2021-07-05 (×7): qty 1

## 2021-07-05 MED ORDER — TRAMADOL HCL 50 MG PO TABS
50.0000 mg | ORAL_TABLET | Freq: Four times a day (QID) | ORAL | Status: DC | PRN
  Administered 2021-07-05 – 2021-07-12 (×15): 50 mg via ORAL
  Filled 2021-07-05 (×15): qty 1

## 2021-07-05 MED ORDER — MELATONIN 3 MG PO TABS
3.0000 mg | ORAL_TABLET | Freq: Every day | ORAL | Status: DC
  Filled 2021-07-05: qty 1

## 2021-07-05 MED ORDER — LORAZEPAM 0.5 MG PO TABS
0.5000 mg | ORAL_TABLET | Freq: Four times a day (QID) | ORAL | Status: DC | PRN
  Administered 2021-07-06 – 2021-07-12 (×2): 0.5 mg via ORAL
  Filled 2021-07-05 (×2): qty 1

## 2021-07-05 MED ORDER — ESCITALOPRAM OXALATE 10 MG PO TABS
5.0000 mg | ORAL_TABLET | Freq: Every day | ORAL | Status: DC
  Administered 2021-07-05 – 2021-07-12 (×8): 5 mg via ORAL
  Filled 2021-07-05 (×8): qty 1

## 2021-07-05 NOTE — Progress Notes (Addendum)
[place] at [facility] ? ? ?PATIENT NAME: Gwendolyn [name]   ? ?MR#:  [id number] ? ?DATE OF BIRTH:  1945/07/09 ? ?SUBJECTIVE:  ? ?patient complains of shoulder pain more. Worked with physical therapy yesterday. Wants to see if she can get some more pain meds. No family at bedside. ? ? ?VITALS:  ?Blood pressure 130/69, pulse 84, temperature 99 ?F (37.2 ?C), resp. rate 19, height 5' 3"  (1.6 m), weight 81.6 kg, SpO2 96 %. ? ?PHYSICAL EXAMINATION:  ? ?GENERAL:  76 y.o.-year-old patient lying in the bed with no acute distress.  ?LUNGS: Normal breath sounds bilaterally, no wheezing, rales, rhonchi.  ?CARDIOVASCULAR: S1, S2 normal. No murmurs, rubs, or gallops.  ?ABDOMEN: Soft, nontender, nondistended. Bowel sounds present.  ?EXTREMITIES: right shoulder sling and left lower extremity brace+ ?NEUROLOGIC: nonfocal  patient is alert and awake ?SKIN: No obvious rash, lesion, or ulcer.  ? ?LABORATORY PANEL:  ?CBC ?Recent Labs  ?Lab 07/05/21 ?0335  ?WBC 2.6*  ?HGB 9.3*  ?HCT 29.7*  ?PLT 152  ? ? ?Chemistries  ?Recent Labs  ?Lab 07/02/21 ?0742 07/03/21 ?0506 07/05/21 ?0335  ?NA 135   < > 138  ?K 4.1   < > 4.1  ?CL 102   < > 108  ?CO2 27   < > 27  ?GLUCOSE 112*   < > 97  ?BUN 14   < > 17  ?CREATININE 0.72   < > 0.69  ?CALCIUM 9.6   < > 9.3  ?MG  --    < > 2.0  ?AST 87*  --   --   ?ALT 38  --   --   ?ALKPHOS 96  --   --   ?BILITOT 1.3*  --   --   ? < > = values in this interval not displayed.  ? ?Cardiac Enzymes ?No results for input(s): TROPONINI in the last 168 hours. ?RADIOLOGY:  ?No results found. ? 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 ? ?Left tibial plateau fracture status post mechanical fall ?-- appreciate ortho evaluation by dr Roland Rack ?-- tentative plan is 6 to 8 weeks  nonweightbearing on left lower extremity with knee immobilizer then possible total knee arthroplasty ?-- due to this injury and right humerus fracture managed nonsurgical he ?-- her VTE risk is increased with two fractures and being more immobile. Patient is placed on Xarelto 10 mg daily for now. Discussed with orthopedic surgery in agreement. ? ?History of right humerus fracture status post fall prior to admission ?-- continue right shoulder immobilizer ?-- follow-up orthopedic as outpatient ? ?cirrhosis of liver secondary to NASH ?-- followed by hospice as outpatient ? ?[name] ?-- on Protonix ? ?hypertension benign ?-- stable. Not on any meds ? ? ? ? ? ? ? ? ? ?Procedures: none ?Family communication : Tawni Millers (legal guardian) ?Consults : orthopedic ?CODE STATUS: DNR ?DVT Prophylaxis : Xarelto ?Level of care: Med-Surg ?Status is: Inpatient ?Remains inpatient appropriate because: awaiting for rehab bed. ?  ? ?TOTAL TIME TAKING CARE OF THIS PATIENT: 25 minutes.  ?>50% time spent on counselling and coordination of care ? ?Note: This dictation was prepared with Dragon dictation along with smaller phrase technology. Any transcriptional errors that result from this process are unintentional. ? ?Fritzi Mandes M.D  ? ? ?  [facility]  ? ?CC: ?Primary care physician; Barbaraann Boys, MD  ?

## 2021-07-05 NOTE — TOC Progression Note (Addendum)
Transition of Care (TOC) - Progression Note  ? ? ?Patient Details  ?Name: Gwendolyn [name] ?MRN: [id number] ?Date of Birth: March 14, 1946 ? ?Transition of Care (TOC) CM/SW Contact  ?Conception Oms, RN ?Phone Number: ?07/05/2021, 11:07 AM ? ?Clinical Narrative:   Spoke with Danielle with [employer], she has spoken with the legal guardian and is looking for long term placement near [place], I explained that the patient is ready and is waiting for a bed ? ? ?Update, ?Spoke with Andee Poles at [facility], per the legal guardian they are seeking placement locally in the [facility]  ?Expected Discharge Plan: [place] ?Barriers to Discharge: Continued Medical Work up, SNF Pending bed offer ? ?Expected Discharge Plan and Services ?Expected Discharge Plan: [place] ?Social Determinants of Health (SDOH) Interventions ?  ? ?Readmission Risk Interventions ?Readmission Risk Prevention Plan 12/04/2020  ?Post Dischage Appt Complete  ?Medication Screening Complete  ?Transportation Screening Complete  ?Some recent data might be hidden  ? ? ?

## 2021-07-06 DIAGNOSIS — S82142A Displaced bicondylar fracture of left tibia, initial encounter for closed fracture: Secondary | ICD-10-CM | POA: Diagnosis not present

## 2021-07-06 DIAGNOSIS — K7460 Unspecified cirrhosis of liver: Secondary | ICD-10-CM | POA: Diagnosis not present

## 2021-07-06 DIAGNOSIS — S42291A Other displaced fracture of upper end of right humerus, initial encounter for closed fracture: Secondary | ICD-10-CM

## 2021-07-06 DIAGNOSIS — I10 Essential (primary) hypertension: Secondary | ICD-10-CM

## 2021-07-06 LAB — BASIC METABOLIC PANEL
Anion gap: 4 — ABNORMAL LOW (ref 5–15)
BUN: 17 mg/dL (ref 8–23)
CO2: 27 mmol/L (ref 22–32)
Calcium: 9.2 mg/dL (ref 8.9–10.3)
Chloride: 105 mmol/L (ref 98–111)
Creatinine, Ser: 0.64 mg/dL (ref 0.44–1.00)
GFR, Estimated: >60 mL/min (ref >60)
Glucose, Bld: 93 mg/dL (ref 70–99)
Potassium: 4 mmol/L (ref 3.5–5.1)
Sodium: 136 mmol/L (ref 135–145)

## 2021-07-06 LAB — CBC WITH DIFFERENTIAL/PLATELET
Abs Immature Granulocytes: 0.01 10*3/uL (ref 0.00–0.07)
Basophils Absolute: 0 10*3/uL (ref 0.0–0.1)
Basophils Relative: 1 %
Eosinophils Absolute: 0 10*3/uL (ref 0.0–0.5)
Eosinophils Relative: 1 %
HCT: 30.5 % — ABNORMAL LOW (ref 36.0–46.0)
Hemoglobin: 9.7 g/dL — ABNORMAL LOW (ref 12.0–15.0)
Immature Granulocytes: 1 %
Lymphocytes Relative: 25 %
Lymphs Abs: 0.5 10*3/uL — ABNORMAL LOW (ref 0.7–4.0)
MCH: 30.9 pg (ref 26.0–34.0)
MCHC: 31.8 g/dL (ref 30.0–36.0)
MCV: 97.1 fL (ref 80.0–100.0)
Monocytes Absolute: 0.3 10*3/uL (ref 0.1–1.0)
Monocytes Relative: 15 %
Neutro Abs: 1.2 10*3/uL — ABNORMAL LOW (ref 1.7–7.7)
Neutrophils Relative %: 57 %
Platelets: 153 10*3/uL (ref 150–400)
RBC: 3.14 MIL/uL — ABNORMAL LOW (ref 3.87–5.11)
RDW: 16.7 % — ABNORMAL HIGH (ref 11.5–15.5)
WBC: 2.1 10*3/uL — ABNORMAL LOW (ref 4.0–10.5)
nRBC: 0 % (ref 0.0–0.2)

## 2021-07-06 LAB — MAGNESIUM: Magnesium: 2.1 mg/dL (ref 1.7–2.4)

## 2021-07-06 MED ORDER — BISACODYL 10 MG RE SUPP
10.0000 mg | Freq: Once | RECTAL | Status: AC
  Administered 2021-07-06: 10 mg via RECTAL
  Filled 2021-07-06: qty 1

## 2021-07-06 NOTE — Progress Notes (Signed)
[place] at [facility] ? ? ?PATIENT NAME: Gwendolyn [name]   ? ?MR#:  [id number] ? ?DATE OF BIRTH:  May 27, 1945 ? ?SUBJECTIVE:  ? ?No new complaints today. ? ? ?VITALS:  ?Blood pressure 122/64, pulse 74, temperature 97.7 ?F (36.5 ?C), temperature source Oral, resp. rate 16, height 5' 3"  (1.6 m), weight 81.6 kg, SpO2 97 %. ? ?PHYSICAL EXAMINATION:  ? ?GENERAL:  76 y.o.-year-old patient lying in the bed with no acute distress.  ?LUNGS: Normal breath sounds bilaterally, no wheezing, rales, rhonchi.  ?CARDIOVASCULAR: S1, S2 normal. No murmurs, rubs, or gallops.  ?ABDOMEN: Soft, nontender, nondistended. Bowel sounds present.  ?EXTREMITIES: right shoulder sling and left lower extremity brace+ ?NEUROLOGIC: nonfocal  patient is alert and awake ?SKIN: No obvious rash, lesion, or ulcer.  ? ?LABORATORY PANEL:  ?CBC ?Recent Labs  ?Lab 07/06/21 ?0401  ?WBC 2.1*  ?HGB 9.7*  ?HCT 30.5*  ?PLT 153  ? ? ? ?Chemistries  ?Recent Labs  ?Lab 07/02/21 ?0742 07/03/21 ?0506 07/06/21 ?0401  ?NA 135   < > 136  ?K 4.1   < > 4.0  ?CL 102   < > 105  ?CO2 27   < > 27  ?GLUCOSE 112*   < > 93  ?BUN 14   < > 17  ?CREATININE 0.72   < > 0.64  ?CALCIUM 9.6   < > 9.2  ?MG  --    < > 2.1  ?AST 87*  --   --   ?ALT 38  --   --   ?ALKPHOS 96  --   --   ?BILITOT 1.3*  --   --   ? < > = values in this interval not displayed.  ? ? ?Cardiac Enzymes ?No results for input(s): TROPONINI in the last 168 hours. ?RADIOLOGY:  ?No results found. ? 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 ? ?Left tibial plateau fracture status post mechanical fall ?-- appreciate ortho evaluation by dr Roland Rack ?-- tentative plan is 6 to 8 weeks nonweightbearing on left lower extremity with knee immobilizer then possible total knee  arthroplasty ?-- due to this injury and right humerus fracture managed nonsurgical he ?-- her VTE risk is increased with two fractures and being more immobile. Patient is placed on Xarelto 10 mg daily for now. Discussed with orthopedic surgery in agreement. ? ?History of right femoral fracture status post fall prior to admission ?-- continue right shoulder immobilizer ?-- follow-up orthopedic as outpatient ? ?cirrhosis of liver secondary to NASH ?-- followed by hospice as outpatient ? ?Gwendolyn [name] ?-- on Protonix ? ?hypertension benign ?-- stable. Not on any meds ? ? ? ? ? ? ? ? ? ?Procedures: none ?Family communication : Tawni Millers (legal guardian) ?Consults : orthopedic ?CODE STATUS: DNR ?DVT Prophylaxis : Xarelto ?Level of care: Med-Surg ?Status is: Inpatient ?Remains inpatient appropriate because: awaiting for rehab bed. ?  ? ?TOTAL TIME TAKING CARE OF THIS PATIENT: 25 minutes.  ?>50% time spent on counselling and coordination of care ? ?Note: This dictation was prepared with Dragon dictation along with smaller phrase technology. Any transcriptional errors that result from this process are unintentional. ? ?Fritzi Mandes M.D  ? ? ?[facility]  ? ?CC: ?Primary care physician; Barbaraann Boys, MD  ?

## 2021-07-06 NOTE — TOC Progression Note (Signed)
Transition of Care (TOC) - Progression Note  ? ? ?Patient Details  ?Name: Gwendolyn [name] ?MRN: [id number] ?Date of Birth: [birth year] ? ?Transition of Care (TOC) CM/SW Contact  ?Conception Oms, RN ?Phone Number: ?07/06/2021, 1:00 PM ? ?Clinical Narrative:   Spoke with Danielle at [facility], They are doing virtual tours with the legal guardian and hope to have a bed within 24 hours then will need paperwork signed, TOC to continue to follow and asssit ? ? ? ?Expected Discharge Plan: [place] ?Barriers to Discharge: Continued Medical Work up, SNF Pending bed offer ? ?Expected Discharge Plan and Services ?Expected Discharge Plan: [place] ?Social Determinants of Health (SDOH) Interventions ?  ? ?Readmission Risk Interventions ? ?  12/04/2020  ?  2:46 PM  ?Readmission Risk Prevention Plan  ?Post Dischage Appt Complete  ?Medication Screening Complete  ?Transportation Screening Complete  ? ? ?

## 2021-07-06 NOTE — Progress Notes (Signed)
Physical Therapy Treatment ?Patient Details ?Name: Gwendolyn [name] ?MRN: [id number] ?DOB: 1945/04/22 ?Today's Date: 07/06/2021 ? ? ?History of Present Illness Pt is a 76 y.o. female with medical history significant for hypothyroidism, hypertension, liver cirrhosis, depression, history of breast cancer status post left mastectomy, trigeminal neuralgia who presents to the ER via EMS after an unwitnessed fall. MD assessement includes left tibial plateau fracture and right humeral fracture both to be managed conservatively. ? ?  ?PT Comments  ? ? Pt was pleasant and motivated to participate during the session and put forth good effort throughout. Pt required assistance with both bed mobility and transfers and required increased help to come to full upright standing this session. Pt's max standing tolerance was around 10 sec each time she stood with pt stating that RUE pain in standing was the primary limiting factor.  No adverse symptoms noted during the session other than RUE pain with SpO2 and HR WNL on room air.  Pt will benefit from PT services in a SNF setting upon discharge to safely address deficits listed in patient problem list for decreased caregiver assistance and eventual return to PLOF. ? ?   ?Recommendations for follow up therapy are one component of a multi-disciplinary discharge planning process, led by the attending physician.  Recommendations may be updated based on patient status, additional functional criteria and insurance authorization. ? ?Follow Up Recommendations ? Skilled nursing-short term rehab (<3 hours/day) ?  ?  ?Assistance Recommended at Discharge Frequent or constant Supervision/Assistance  ?Patient can return home with the following Two people to help with walking and/or transfers;Two people to help with bathing/dressing/bathroom;Direct supervision/assist for medications management;Assistance with cooking/housework;Direct supervision/assist for financial management;Assist for  transportation ?  ?Equipment Recommendations ? Other (comment) (TBD)  ?  ?Recommendations for Other Services   ? ? ?  ?Precautions / Restrictions Precautions ?Precautions: Fall ?Required Braces or Orthoses: Knee Immobilizer - Left ?Restrictions ?Weight Bearing Restrictions: Yes ?RUE Weight Bearing: Non weight bearing ?LLE Weight Bearing: Non weight bearing ?Other Position/Activity Restrictions: OK for LLE quad sets per Dr. Roland Rack  ?  ? ?Mobility ? Bed Mobility ?Overal bed mobility: Needs Assistance ?Bed Mobility: Supine to Sit, Sit to Supine ?  ?  ?Supine to sit: Min assist, Mod assist ?Sit to supine: Min assist, Mod assist ?  ?General bed mobility comments: Min to mod A for LLE and trunk control during sup to/from sit ?  ? ?Transfers ?Overall transfer level: Needs assistance ?  ?Transfers: Sit to/from Stand ?Sit to Stand: Min assist, From elevated surface, +2 physical assistance, Mod assist ?  ?  ?  ?  ?  ?General transfer comment: Pt able to come to near full upright standing position x 2 with LUE pulling from sink; mod verbal cues for sequencing and with pt's left heel on top of this PT's foot to ensure WB compliance ?  ? ?Ambulation/Gait ?  ?  ?  ?  ?  ?  ?  ?General Gait Details: unable/unsafe to attempt ? ? ?Stairs ?  ?  ?  ?  ?  ? ? ?Wheelchair Mobility ?  ? ?Modified Rankin (Stroke Patients Only) ?  ? ? ?  ?Balance Overall balance assessment: Needs assistance ?  ?Sitting balance-Leahy Scale: Good ?  ?  ?Standing balance support: Single extremity supported ?Standing balance-Leahy Scale: Fair ?  ?  ?  ?  ?  ?  ?  ?  ?  ?  ?  ?  ?  ? ?  ?  Cognition Arousal/Alertness: Awake/alert ?Behavior During Therapy: [facility] for tasks assessed/performed ?Overall Cognitive Status: No family/caregiver present to determine baseline cognitive functioning ?  ?  ?  ?  ?  ?  ?  ?  ?  ?  ?  ?  ?  ?  ?  ?  ?  ?  ?  ? ?  ?Exercises Total Joint Exercises ?Ankle Circles/Pumps: AROM, Strengthening, Both, 5 reps, 10 reps (manual resistance on  the RLE) ?Quad Sets: Strengthening, Both, 5 reps, 10 reps ?Gluteal Sets: Strengthening, Both, 5 reps, 10 reps ?Heel Slides: AROM, Strengthening, Right, 5 reps ?Hip ABduction/ADduction: AAROM, Strengthening, AROM, Both, 10 reps ?Straight Leg Raises: AROM, AAROM, Strengthening, Both, 10 reps ?Long Arc Quad: AROM, Strengthening, Right, 5 reps, 10 reps ?Knee Flexion: AROM, Strengthening, Right, 5 reps, 10 reps ?Bridges: Strengthening, Right, 10 reps ? ?  ?General Comments   ?  ?  ? ?Pertinent Vitals/Pain Pain Assessment ?Pain Assessment: 0-10 ?Pain Score: 4  ?Pain Location: RUE ?Pain Descriptors / Indicators: Sore, Aching ?Pain Intervention(s): Repositioned, Premedicated before session, Monitored during session  ? ? ?Home Living   ?  ?  ?  ?  ?  ?  ?  ?  ?  ?   ?  ?Prior Function    ?  ?  ?   ? ?PT Goals (current goals can now be found in the care plan section) Progress towards PT goals: Progressing toward goals ? ?  ?Frequency ? ? ? Min 2X/week ? ? ? ?  ?PT Plan Current plan remains appropriate  ? ? ?Co-evaluation   ?  ?  ?  ?  ? ?  ?AM-PAC PT "6 Clicks" Mobility   ?Outcome Measure ? Help needed turning from your back to your side while in a flat bed without using bedrails?: A Little ?Help needed moving from lying on your back to sitting on the side of a flat bed without using bedrails?: A Lot ?Help needed moving to and from a bed to a chair (including a wheelchair)?: A Lot ?Help needed standing up from a chair using your arms (e.g., wheelchair or bedside chair)?: A Lot ?Help needed to walk in hospital room?: Total ?Help needed climbing 3-5 steps with a railing? : Total ?6 Click Score: 11 ? ?  ?End of Session Equipment Utilized During Treatment: Gait belt ?Activity Tolerance: Patient limited by pain ?Patient left: in bed;with call bell/phone within reach;with bed alarm set ?Nurse Communication: Mobility status;Weight bearing status ?PT Visit Diagnosis: History of falling (Z91.81);Difficulty in walking, not elsewhere  classified (R26.2);Muscle weakness (generalized) (M62.81);Pain ?Pain - Right/Left: Right ?Pain - part of body: Shoulder ?  ? ? ?Time: 2751-7001 ?PT Time Calculation (min) (ACUTE ONLY): 29 min ? ?Charges:  $Therapeutic Exercise: 8-22 mins ?$Therapeutic Activity: 8-22 mins          ?          ?D. Royetta Asal PT, DPT ?07/06/21, 3:56 PM ? ? ?

## 2021-07-07 DIAGNOSIS — S82142A Displaced bicondylar fracture of left tibia, initial encounter for closed fracture: Secondary | ICD-10-CM | POA: Diagnosis not present

## 2021-07-07 DIAGNOSIS — I10 Essential (primary) hypertension: Secondary | ICD-10-CM | POA: Diagnosis not present

## 2021-07-07 DIAGNOSIS — K7460 Unspecified cirrhosis of liver: Secondary | ICD-10-CM | POA: Diagnosis not present

## 2021-07-07 DIAGNOSIS — S42291A Other displaced fracture of upper end of right humerus, initial encounter for closed fracture: Secondary | ICD-10-CM | POA: Diagnosis not present

## 2021-07-07 NOTE — Care Management Important Message (Signed)
Important Message ? ?Patient Details  ?Name: Gwendolyn [name] ?MRN: [id number] ?Date of Birth: 1946-03-19 ? ? ?Medicare Important Message Given:  Other (see comment) ? ?Patient is discharging home with Hospice. Out of respect for the patient and family no Important Message from [facility] given. ? ? ?Juliann Pulse A Randie Tallarico ?07/07/2021, 2:12 PM ?

## 2021-07-07 NOTE — TOC Progression Note (Signed)
Transition of Care (TOC) - Progression Note  ? ? ?Patient Details  ?Name: Gwendolyn [name] ?MRN: [id number] ?Date of Birth: 1945/12/02 ? ?Transition of Care (TOC) CM/SW Contact  ?Conception Oms, RN ?Phone Number: ?07/07/2021, 9:20 AM ? ?Clinical Narrative:   Received a call from [facility] with [facility], They have a bed offer for the patient with one on One care, the POA is to sign papers and make Payment, [employer] will notify me with DME needs, She will be followed by Hospice as well ? ? ? ?Expected Discharge Plan: [place] ?Barriers to Discharge: Continued Medical Work up, SNF Pending bed offer ? ?Expected Discharge Plan and Services ?Expected Discharge Plan: [place] ?Social Determinants of Health (SDOH) Interventions ?  ? ?Readmission Risk Interventions ? ?  12/04/2020  ?  2:46 PM  ?Readmission Risk Prevention Plan  ?Post Dischage Appt Complete  ?Medication Screening Complete  ?Transportation Screening Complete  ? ? ?

## 2021-07-07 NOTE — Plan of Care (Signed)

## 2021-07-07 NOTE — Progress Notes (Signed)
Dr Posey Pronto made aware of new blisters to itchy rash on buttocks and back of L upper leg.  ?

## 2021-07-07 NOTE — Progress Notes (Addendum)
[place] at [facility] ? ? ?PATIENT NAME: Gwendolyn [name]   ? ?MR#:  [id number] ? ?DATE OF BIRTH:  1945-06-24 ? ?SUBJECTIVE:  ? ?No new complaints per pt ?Rn noticed some skin rash on the left buttock. ? ? ?VITALS:  ?Blood pressure 128/70, pulse 72, temperature 97.8 ?F (36.6 ?C), resp. rate 18, height 5' 3"  (1.6 m), weight 81.6 kg, SpO2 97 %. ? ?PHYSICAL EXAMINATION:  ? ?GENERAL:  76 y.o.-year-old patient lying in the bed with no acute distress.  ?LUNGS: Normal breath sounds bilaterally, no wheezing, rales, rhonchi.  ?CARDIOVASCULAR: S1, S2 normal. No murmurs, rubs, or gallops.  ?ABDOMEN: Soft, nontender, nondistended. Bowel sounds present.  ?EXTREMITIES: right shoulder sling and left lower extremity brace+ ?NEUROLOGIC: nonfocal  patient is alert and awake ?SKIN: left buttock rash  ? ?LABORATORY PANEL:  ?CBC ?Recent Labs  ?Lab 07/06/21 ?0401  ?WBC 2.1*  ?HGB 9.7*  ?HCT 30.5*  ?PLT 153  ? ? ? ?Chemistries  ?Recent Labs  ?Lab 07/02/21 ?0742 07/03/21 ?0506 07/06/21 ?0401  ?NA 135   < > 136  ?K 4.1   < > 4.0  ?CL 102   < > 105  ?CO2 27   < > 27  ?GLUCOSE 112*   < > 93  ?BUN 14   < > 17  ?CREATININE 0.72   < > 0.64  ?CALCIUM 9.6   < > 9.2  ?MG  --    < > 2.1  ?AST 87*  --   --   ?ALT 38  --   --   ?ALKPHOS 96  --   --   ?BILITOT 1.3*  --   --   ? < > = values in this interval not displayed.  ? ? ?Cardiac Enzymes ?No results for input(s): TROPONINI in the last 168 hours. ?RADIOLOGY:  ?No results found. ? ?Assessment and Plan ?[name] is a 76 yo female with PMH NASH cirrhosis (was on hospice at ALF), chronic low back pain, depression/anxiety, fibromyalgia, HTN, HLD, hypothyroidism, RLS, trigeminal neuralgia, IBS who presented to the hospital after a mechanical fall. ? ?Left tibial plateau fracture status post mechanical fall ?-- appreciate ortho evaluation by dr Roland Rack ?-- tentative plan is 6 to 8 weeks nonweightbearing on left lower extremity with knee immobilizer then possible total knee  arthroplasty ?-- due to this injury and right humerus fracture managed nonsurgical he ?-- her VTE risk is increased with two fractures and being more immobile. Patient is placed on Xarelto 10 mg daily for now. Discussed with orthopedic surgery in agreement. ? ?History of right humerus fracture status post fall prior to admission ?-- continue right shoulder immobilizer ?-- follow-up orthopedic as outpatient ? ?cirrhosis of liver secondary to NASH ?-- followed by hospice as outpatient ? ?Gwendolyn [name] ?-- on Protonix ? ?hypertension benign ?-- stable. Not on any meds ? ? ?Per TOC bed offered to pt and family. Likely d/c in am ? ? ? ? ? ? ?Procedures: none ?Family communication : Tawni Millers (legal guardian) ?Consults : orthopedic ?CODE STATUS: DNR ?DVT Prophylaxis : Xarelto ?Level of care: Med-Surg ?Status is: Inpatient ?Remains inpatient appropriate because: awaiting for rehab bed. ?  ? ?TOTAL TIME TAKING CARE OF THIS PATIENT: 25 minutes.  ?>50% time spent on counselling and coordination of care ? ?Note: This dictation was prepared with Dragon dictation along with smaller phrase technology. Any transcriptional errors that result from this process are unintentional. ? ?Fritzi Mandes M.D  ? ? ?[facility]  ? ?CC: ?Primary care physician; Barbaraann Boys, MD  ?

## 2021-07-07 NOTE — Progress Notes (Signed)
[facility]) Hospital Liaison Note ?  ?Received request from Transitions of Care Manager, [place], for hospice services at home after discharge. Chart and patient information under review by [facility] physician. Hospice eligibility pending. ?  ?Spoke with [employer]: [phone] to initiate education related to hospice philosophy, services, and team approach to care. Ivin Booty  verbalized understanding of information given. Per discussion, the plan is for patient to discharge home via [facility] once cleared to DC.  ?  ?DME needs discussed. Patient has the following equipment in the home ?(Purchased privately): ?walker ?Patient requests the following equipment for delivery: ?Hospital bed ?Bedside table ? ?Patient will not be returning to address listed in chart. Patient to return to: ?[address], [place], [place] [address] ?Isabela Henry/Facility Admin. [phone]) is the family member to contact to arrange time of equipment delivery.  ?  ?Please send signed and completed DNR home with patient/family. Please provide prescriptions at discharge as needed to ensure ongoing symptom management.  ?  ?[facility] information and contact numbers given to family & above information shared with TOC. ?  ?Please call with any questions/concerns.  ?  ?Thank you for the opportunity to participate in this patient's care. ?  ?Daphene Calamity, MSW ?[place]  ?[phone] ? ?

## 2021-07-08 DIAGNOSIS — S42291A Other displaced fracture of upper end of right humerus, initial encounter for closed fracture: Secondary | ICD-10-CM | POA: Diagnosis not present

## 2021-07-08 DIAGNOSIS — K7460 Unspecified cirrhosis of liver: Secondary | ICD-10-CM | POA: Diagnosis not present

## 2021-07-08 DIAGNOSIS — S82142A Displaced bicondylar fracture of left tibia, initial encounter for closed fracture: Secondary | ICD-10-CM | POA: Diagnosis not present

## 2021-07-08 DIAGNOSIS — I10 Essential (primary) hypertension: Secondary | ICD-10-CM | POA: Diagnosis not present

## 2021-07-08 LAB — RESP PANEL BY RT-PCR (FLU A&B, COVID) ARPGX2
Influenza A by PCR: NEGATIVE
Influenza B by PCR: NEGATIVE
SARS Coronavirus 2 by RT PCR: NEGATIVE

## 2021-07-08 MED ORDER — LIDOCAINE 5 % EX PTCH: 1.0000 | MEDICATED_PATCH | Freq: Every day | CUTANEOUS | 0 refills | Status: AC

## 2021-07-08 MED ORDER — RIVAROXABAN 10 MG PO TABS: 10.0000 mg | ORAL_TABLET | Freq: Every day | ORAL | 2 refills | Status: AC

## 2021-07-08 MED ORDER — TRAMADOL HCL 50 MG PO TABS: 50.0000 mg | ORAL_TABLET | Freq: Two times a day (BID) | ORAL | 0 refills | Status: AC | PRN

## 2021-07-08 MED ORDER — LORAZEPAM 0.5 MG PO TABS: 0.5000 mg | ORAL_TABLET | Freq: Four times a day (QID) | ORAL | 0 refills | Status: AC | PRN

## 2021-07-08 MED ORDER — HYDROCODONE-ACETAMINOPHEN 5-325 MG PO TABS: 1.0000 | ORAL_TABLET | Freq: Four times a day (QID) | ORAL | 0 refills | Status: AC | PRN

## 2021-07-08 NOTE — Progress Notes (Signed)
Physical Therapy Treatment ?Patient Details ?Name: Gwendolyn [name] ?MRN: [id number] ?DOB: Oct 27, 1945 ?Today's Date: 07/08/2021 ? ? ?History of Present Illness Pt is a 76 y.o. female with medical history significant for hypothyroidism, hypertension, liver cirrhosis, depression, history of breast cancer status post left mastectomy, trigeminal neuralgia who presents to the ER via EMS after an unwitnessed fall. MD assessement includes left tibial plateau fracture and right humeral fracture both to be managed conservatively. ? ?  ?PT Comments  ? ? Pt was awake, long sitting in bed upon arriving. She required encouragement to participate however once willing, was cooperative throughout. She was able to exit R side of bed with increased time + min A. Stood 2 x EOB with LUE HHA only. Session progressed to stand pivot to recliner towards L with L HHA + mod assist. Overall pt is doing well however lacks insight of deficits. She reports she does not think she will ever be able to walk again. Author explained that NWB restrictions are not long term. She will greatly benefit form SNF at DC to address deficits while maximizing independence with ADLs.  ?  ?Recommendations for follow up therapy are one component of a multi-disciplinary discharge planning process, led by the attending physician.  Recommendations may be updated based on patient status, additional functional criteria and insurance authorization. ? ?Follow Up Recommendations ? Skilled nursing-short term rehab (<3 hours/day) ?  ?  ?Assistance Recommended at Discharge Frequent or constant Supervision/Assistance  ?Patient can return home with the following Direct supervision/assist for medications management;Assistance with cooking/housework;Direct supervision/assist for financial management;Assist for transportation;A lot of help with walking and/or transfers;A lot of help with bathing/dressing/bathroom;Help with stairs or ramp for entrance ?  ?Equipment Recommendations ?  None recommended by PT  ?  ?   ?Precautions / Restrictions Precautions ?Precautions: Fall ?Required Braces or Orthoses: Knee Immobilizer - Left ?Restrictions ?Weight Bearing Restrictions: Yes ?RUE Weight Bearing: Non weight bearing ?LLE Weight Bearing: Non weight bearing  ?  ? ?Mobility ? Bed Mobility ?Overal bed mobility: Needs Assistance ?Bed Mobility: Supine to Sit ?  ?  ?Supine to sit: Min assist ?  ?  ?General bed mobility comments: min assist to progress to R side of bed short sitting ?  ? ?Transfers ?Overall transfer level: Needs assistance ?Equipment used: 1 person hand held assist ?Transfers: Sit to/from Stand, Bed to chair/wheelchair/BSC ?Sit to Stand: Min assist, From elevated surface ?Stand pivot transfers: Mod assist ?  ?  ?  ?  ?General transfer comment: pt performed STS 2 x EOB prior to stand pivot to recliner. ?  ? ?Ambulation/Gait ?   ?General Gait Details: unable due to wt bearing restrictions ? ? ?  ?Balance Overall balance assessment: Needs assistance ?Sitting-balance support: Single extremity supported ?Sitting balance-Leahy Scale: Good ?  ?  ?Standing balance support: Single extremity supported ?Standing balance-Leahy Scale: Fair ?  ?  ?  ?Cognition Arousal/Alertness: Awake/alert ?Behavior During Therapy: [facility] for tasks assessed/performed ?Overall Cognitive Status: No family/caregiver present to determine baseline cognitive functioning ?  ?   ?General Comments: Some minor difficulties recalling details during history ?  ?  ? ?  ?   ?General Comments General comments (skin integrity, edema, etc.): reviewed importance of performing ther ex throughout the day. she demonstartes understanding. ?  ?  ? ?Pertinent Vitals/Pain Pain Assessment ?Pain Assessment: 0-10 ?Pain Score: 6  ?Pain Location: LLE ?Pain Descriptors / Indicators: Sore, Aching ?Pain Intervention(s): Limited activity within patient's tolerance, Premedicated before session, Monitored during session, Repositioned  ? ? ? ?  PT Goals (current  goals can now be found in the care plan section) Acute Rehab PT Goals ?Patient Stated Goal: rehab ?Progress towards PT goals: Progressing toward goals ? ?  ?Frequency ? ? ? Min 2X/week ? ? ? ?  ?PT Plan Current plan remains appropriate  ? ? ?   ?AM-PAC PT "6 Clicks" Mobility   ?Outcome Measure ? Help needed turning from your back to your side while in a flat bed without using bedrails?: A Little ?Help needed moving from lying on your back to sitting on the side of a flat bed without using bedrails?: A Little ?Help needed moving to and from a bed to a chair (including a wheelchair)?: A Little ?Help needed standing up from a chair using your arms (e.g., wheelchair or bedside chair)?: A Little ?Help needed to walk in hospital room?: Total ?Help needed climbing 3-5 steps with a railing? : Total ?6 Click Score: 14 ? ?  ?End of Session Equipment Utilized During Treatment: Gait belt ?Activity Tolerance: Patient limited by pain ?Patient left: in chair;with call bell/phone within reach;with chair alarm set ?Nurse Communication: Mobility status;Weight bearing status ?PT Visit Diagnosis: History of falling (Z91.81);Difficulty in walking, not elsewhere classified (R26.2);Muscle weakness (generalized) (M62.81);Pain ?Pain - Right/Left: Right ?Pain - part of body: Shoulder ?  ? ? ?Time: 0677-0340 ?PT Time Calculation (min) (ACUTE ONLY): 25 min ? ?Charges:  $Therapeutic Activity: 23-37 mins          ?          ?Julaine Fusi PTA ?07/08/21, 10:55 AM  ? ?

## 2021-07-08 NOTE — Progress Notes (Addendum)
[facility]) Hospital Liaison Note ?  ?Patient is unable to transfer to ALF today as family is finalizing contracts/payment. Patient's DME is anticipated to arrive this afternoon. Patient to be followed by [facility] upon DC to initiate hospice services. ?  ?[facility] information and contact numbers given to family & above information shared with TOC. ?  ?Please send signed and completed DNR home with patient/family. Please provide prescriptions at discharge as needed to ensure ongoing symptom management.  ? ?Please call with any questions/concerns.  ?  ?Thank you for the opportunity to participate in this patient's care. ?  ?Daphene Calamity, MSW ?[place]  ?[phone] ?

## 2021-07-08 NOTE — TOC Progression Note (Signed)
Transition of Care (TOC) - Progression Note  ? ? ?Patient Details  ?Name: Gwendolyn [name] ?MRN: [id number] ?Date of Birth: [birth year] ? ?Transition of Care (TOC) CM/SW Contact  ?Conception Oms, RN ?Phone Number: ?07/08/2021, 9:19 AM ? ?Clinical Narrative:   Reached out to [facility] with [facility] for update, awaiting a response ? ? ? ?Expected Discharge Plan: [place] ?Barriers to Discharge: Continued Medical Work up, SNF Pending bed offer ? ?Expected Discharge Plan and Services ?Expected Discharge Plan: [place] ?Social Determinants of Health (SDOH) Interventions ?  ? ?Readmission Risk Interventions ? ?  12/04/2020  ?  2:46 PM  ?Readmission Risk Prevention Plan  ?Post Dischage Appt Complete  ?Medication Screening Complete  ?Transportation Screening Complete  ? ? ?

## 2021-07-08 NOTE — Plan of Care (Signed)

## 2021-07-08 NOTE — [place] FL2 (Signed)
?[place] MEDICAID FL2 LEVEL OF CARE SCREENING TOOL  ?  ? ?IDENTIFICATION  ?Patient Name: ?Gwendolyn [name] Birthdate: 07-24-1945 Sex: female Admission Date (Current Location): ?07/02/2021  ?[place] and [place] Number: ? [place] ?  Facility and Address:  ?[facility], [address], [place], [place] [address] ?     Provider Number: ?[id number]  ?Attending Physician Name and Address:  ?Fritzi Mandes, MD ? Relative Name and Phone Number:  ?[place] [phone] ?   ?Current Level of Care: ?Hospital Recommended Level of Care: ?Other (Comment) Web designer Living with services) Prior Approval Number: ?  ? ?Date Approved/Denied: ?  PASRR Number: ?  ? ?Discharge Plan: ?Other (Comment) Web designer Living with Services) ?  ? ?Current Diagnoses: ?Patient Active Problem List  ? Diagnosis Date Noted  ? Recurrent falls 07/03/2021  ? Fall 07/02/2021  ? Right humeral fracture 07/02/2021  ? Tibial plateau fracture, left 07/02/2021  ? Morbidly obese ([place]) 12/11/2020  ? Hypernatremia 12/10/2020  ? Thrombocytopenia ([place]) 12/10/2020  ? Acute respiratory failure with hypoxia ([place]) 12/02/2020  ? Acute metabolic encephalopathy 73/53/2992  ? Cirrhosis of liver ([place])   ? Cerebrovascular disease   ? ARF (acute renal failure) ([place]) 01/09/2018  ? Chronic venous insufficiency 11/28/2017  ? Lymphedema 11/28/2017  ? Leg pain 11/28/2017  ? GERD (gastroesophageal reflux disease) 11/28/2017  ? Hypothyroidism 12/10/2013  ? Medicare annual wellness visit, subsequent 07/08/2013  ? Weakness of both legs 07/08/2013  ? Night sweats 01/16/2013  ? Tremor 01/16/2013  ? Palpitations 01/16/2013  ? Fibromyalgia 07/22/2012  ? Essential hypertension, benign 06/19/2012  ? Bereavement 06/19/2012  ? Insomnia 03/28/2012  ? Lumbar back pain 12/22/2011  ? HX: breast cancer 09/18/2011  ? Abnormal CT lung screening 08/07/2011  ? NASH (nonalcoholic steatohepatitis) 06/12/2011  ? ? ?Orientation RESPIRATION BLADDER Height & Weight   ?  ?Self,  Place, Situation ? Normal Continent Weight: 81.6 kg ?Height:  5' 3"  (160 cm)  ?BEHAVIORAL SYMPTOMS/MOOD NEUROLOGICAL BOWEL NUTRITION STATUS  ?    Continent Diet (Regular)  ?AMBULATORY STATUS COMMUNICATION OF NEEDS Skin   ?Extensive Assist Verbally Normal ?  ?  ?  ?    ?     ?     ? ? ?Personal Care Assistance Level of Assistance  ?Bathing, Feeding, Dressing Bathing Assistance: Maximum assistance ?Feeding assistance: Limited assistance ?Dressing Assistance: Maximum assistance ?   ? ?Functional Limitations Info  ?    ?  ?   ? ? ?SPECIAL CARE FACTORS FREQUENCY  ?    ?  ?  ?  ?  ?  ?  ?   ? ? ?Contractures Contractures Info: Not present  ? ? ?Additional Factors Info  ?Code Status, Allergies Code Status Info: DNR ?Allergies Info: Codeine, Oxycodone, Silver ?  ?  ?  ?   ? ?Current Medications (07/08/2021):  This is the current hospital active medication list ?Current Facility-Administered Medications  ?Medication Dose Route Frequency Provider Last Rate Last Admin  ? 0.9 %  sodium chloride infusion  250 mL Intravenous PRN Agbata, Tochukwu, MD      ? albuterol (PROVENTIL) (2.5 MG/3ML) 0.083% nebulizer solution 3 mL  3 mL Inhalation Q6H PRN Agbata, Tochukwu, MD      ? calcium carbonate (OS-CAL - dosed in mg of elemental calcium) tablet 500 mg of elemental calcium  1 tablet Oral Daily Agbata, Tochukwu, MD   500 mg of elemental calcium at 07/08/21 0925  ? cholecalciferol (VITAMIN D3) tablet 2,000 Units  2,000 Units Oral Daily Collier Bullock, MD   2,000 Units at 07/08/21 0925  ? escitalopram (LEXAPRO) tablet 5 mg  5 mg Oral Daily Fritzi Mandes, MD   5 mg at 07/08/21 4174  ? HYDROcodone-acetaminophen (NORCO/VICODIN) 5-325 MG per tablet 1 tablet  1 tablet Oral Q6H PRN Agbata, Tochukwu, MD   1 tablet at 07/07/21 2135  ? hydrocortisone cream 1 % 1 application.  1 application. Topical TID PRN Dwyane Dee, MD   1 application. at 07/07/21 0925  ? levothyroxine (SYNTHROID) tablet 75 mcg  75 mcg Oral Q0600 Collier Bullock, MD   75  mcg at 07/08/21 0622  ? lidocaine (LIDODERM) 5 % 1 patch  1 patch Transdermal QHS Foust, Katy L, NP   1 patch at 07/07/21 2132  ? LORazepam (ATIVAN) tablet 0.5 mg  0.5 mg Oral Q6H PRN Fritzi Mandes, MD   0.5 mg at 07/06/21 1840  ? melatonin tablet 2.5 mg  2.5 mg Oral QHS Dorothe Pea, RPH   2.5 mg at 07/07/21 2130  ? ondansetron (ZOFRAN) tablet 4 mg  4 mg Oral Q6H PRN Agbata, Tochukwu, MD      ? Or  ? ondansetron (ZOFRAN) injection 4 mg  4 mg Intravenous Q6H PRN Agbata, Tochukwu, MD      ? pantoprazole (PROTONIX) EC tablet 40 mg  40 mg Oral Daily Agbata, Tochukwu, MD   40 mg at 07/08/21 0814  ? rivaroxaban (XARELTO) tablet 10 mg  10 mg Oral Daily Dwyane Dee, MD   10 mg at 07/07/21 2131  ? sodium chloride flush (NS) 0.9 % injection 3 mL  3 mL Intravenous Q12H Agbata, Tochukwu, MD   3 mL at 07/08/21 0926  ? sodium chloride flush (NS) 0.9 % injection 3 mL  3 mL Intravenous PRN Agbata, Tochukwu, MD      ? traMADol (ULTRAM) tablet 50 mg  50 mg Oral Q6H PRN Fritzi Mandes, MD   50 mg at 07/07/21 1655  ? ? ? ?Discharge Medications: ?Please see discharge summary for a list of discharge medications. ? ?Relevant Imaging Results: ? ?Relevant Lab Results: ? ? ?Additional Information ?SS #: [phone] ? ?Conception Oms, RN ? ? ? ? ?

## 2021-07-08 NOTE — Progress Notes (Addendum)
[place] at [facility] ? ? ?PATIENT NAME: Gwendolyn [name]   ? ?MR#:  [id number] ? ?DATE OF BIRTH:  02-Aug-1945 ? ?SUBJECTIVE:  ? ?No new complaints per pt ?No family at bedside ? ? ?VITALS:  ?Blood pressure 123/77, pulse 78, temperature 97.9 ?F (36.6 ?C), resp. rate 16, height 5' 3"  (1.6 m), weight 81.6 kg, SpO2 98 %. ? ?PHYSICAL EXAMINATION:  ? ?GENERAL:  76 y.o.-year-old patient lying in the bed with no acute distress.  ?LUNGS: Normal breath sounds bilaterally, no wheezing, rales, rhonchi.  ?CARDIOVASCULAR: S1, S2 normal. No murmurs, rubs, or gallops.  ?ABDOMEN: Soft, nontender, nondistended. Bowel sounds present.  ?EXTREMITIES: right shoulder sling and left lower extremity brace+ ?NEUROLOGIC: nonfocal  patient is alert and awake ?SKIN: left buttock rash  ? ?LABORATORY PANEL:  ?CBC ?Recent Labs  ?Lab 07/06/21 ?0401  ?WBC 2.1*  ?HGB 9.7*  ?HCT 30.5*  ?PLT 153  ? ? ? ?Chemistries  ?Recent Labs  ?Lab 07/02/21 ?0742 07/03/21 ?0506 07/06/21 ?0401  ?NA 135   < > 136  ?K 4.1   < > 4.0  ?CL 102   < > 105  ?CO2 27   < > 27  ?GLUCOSE 112*   < > 93  ?BUN 14   < > 17  ?CREATININE 0.72   < > 0.64  ?CALCIUM 9.6   < > 9.2  ?MG  --    < > 2.1  ?AST 87*  --   --   ?ALT 38  --   --   ?ALKPHOS 96  --   --   ?BILITOT 1.3*  --   --   ? < > = values in this interval not displayed.  ? ? ?Cardiac Enzymes ?No results for input(s): TROPONINI in the last 168 hours. ?RADIOLOGY:  ?No results found. ? ?Assessment and Plan ?Ms. Ishii is a 76 yo female with PMH NASH cirrhosis (was on hospice at ALF), chronic low back pain, depression/anxiety, fibromyalgia, HTN, HLD, hypothyroidism, RLS, trigeminal neuralgia, IBS who presented to the hospital after a mechanical fall. ? ?Left tibial plateau fracture status post mechanical fall ?-- appreciate ortho evaluation by dr Roland Rack ?-- tentative plan is 6 to 8 weeks nonweightbearing on left lower extremity with knee immobilizer then possible total knee arthroplasty ?-- due to this  injury and right humerus fracture managed nonsurgical he ?-- her VTE risk is increased with two fractures and being more immobile. Patient is placed on Xarelto 10 mg daily for now. Discussed with orthopedic surgery in agreement. ? ?History of right humerus fracture status post fall prior to admission ?-- continue right shoulder immobilizer ?-- follow-up orthopedic as outpatient ? ?cirrhosis of liver secondary to NASH ?-- followed by hospice as outpatient ? ?[name] ?-- on Protonix ? ?hypertension benign ?-- stable. Not on any meds ? ? ?Per TOC bed offered to pt and family.Refer to [facility] notes for barrier to discharge. ? ? ? ?Procedures: none ?Family communication : Tawni Millers (legal guardian) has been updated regarding d/c plans by [facility] ?Consults : orthopedic ?CODE STATUS: DNR ?DVT Prophylaxis : Xarelto ?Level of care: Med-Surg ?Status is: Inpatient ?Remains inpatient appropriate because: awaiting for rehab bed. ?  ? ?TOTAL TIME TAKING CARE OF THIS PATIENT: 25 minutes.  ?>50% time spent on counselling and coordination of care ? ?Note: This dictation was prepared with Dragon dictation along with smaller phrase technology. Any transcriptional errors that result from this process are unintentional. ? ?Fritzi Mandes M.D  ? ? ?  [facility]  ? ?CC: ?Primary care physician; Barbaraann Boys, MD  ?

## 2021-07-08 NOTE — TOC Progression Note (Signed)
Transition of Care (TOC) - Progression Note  ? ? ?Patient Details  ?Name: Gwendolyn [name] ?MRN: [id number] ?Date of Birth: 04-23-1945 ? ?Transition of Care (TOC) CM/SW Contact  ?Conception Oms, RN ?Phone Number: ?07/08/2021, 2:20 PM ? ?Clinical Narrative:    ?Spoke with Ivin Booty the Legal Guardian, the contract will need to be amended and will not be signed until it is, The financial aspect has to be worked out to transfer the money  for the care at [facility] ?I called Danielle with [facility] and stressed the concerns of the legal guardian, Andee Poles is going to call [place] work with them to amend the contract an find the way to transfer the money as well as determine the amount needing to transfer to [place], She will call Ivin Booty back and work it out, the patient iwll not be able to discharge today until all if this is worked out ? ? ?Expected Discharge Plan: [place] ?Barriers to Discharge: Continued Medical Work up, SNF Pending bed offer ? ?Expected Discharge Plan and Services ?Expected Discharge Plan: [place] ?Social Determinants of Health (SDOH) Interventions ?  ? ?Readmission Risk Interventions ? ?  12/04/2020  ?  2:46 PM  ?Readmission Risk Prevention Plan  ?Post Dischage Appt Complete  ?Medication Screening Complete  ?Transportation Screening Complete  ? ? ?

## 2021-07-08 NOTE — TOC Progression Note (Addendum)
Transition of Care (TOC) - Progression Note  ? ? ?Patient Details  ?Name: Gwendolyn [name] ?MRN: [id number] ?Date of Birth: September 26, 1945 ? ?Transition of Care (TOC) CM/SW Contact  ?Conception Oms, RN ?Phone Number: ?07/08/2021, 11:22 AM ? ?Clinical Narrative:   Spoke with Danielle at [facility], The patient is going to go to [facility] at [place], [place] to follow, DME to be delivered today, Will transport [facility], FL2 updated ? ?Sent via secure email to [place] at [facility], Awaiting the go ahead to DC ? ?Expected Discharge Plan: [place] ?Barriers to Discharge: Continued Medical Work up, SNF Pending bed offer ? ?Expected Discharge Plan and Services ?Expected Discharge Plan: [place] ?Social Determinants of Health (SDOH) Interventions ?  ? ?Readmission Risk Interventions ? ?  12/04/2020  ?  2:46 PM  ?Readmission Risk Prevention Plan  ?Post Dischage Appt Complete  ?Medication Screening Complete  ?Transportation Screening Complete  ? ? ?

## 2021-07-09 DIAGNOSIS — K219 Gastro-esophageal reflux disease without esophagitis: Secondary | ICD-10-CM

## 2021-07-09 DIAGNOSIS — K7460 Unspecified cirrhosis of liver: Secondary | ICD-10-CM

## 2021-07-09 DIAGNOSIS — S42214K Unspecified nondisplaced fracture of surgical neck of right humerus, subsequent encounter for fracture with nonunion: Secondary | ICD-10-CM

## 2021-07-09 DIAGNOSIS — S82142A Displaced bicondylar fracture of left tibia, initial encounter for closed fracture: Principal | ICD-10-CM

## 2021-07-09 NOTE — Progress Notes (Addendum)
[place] at [facility] ? ? ?PATIENT NAME: Gwendolyn [name]   ? ?MR#:  [id number] ? ?DATE OF BIRTH:  Feb 29, 1946 ? ?SUBJECTIVE:  ? ?Slept overnite ?No new issues per RN ? ? ?VITALS:  ?Blood pressure 133/78, pulse 84, temperature 98.4 ?F (36.9 ?C), resp. rate 16, height 5' 3"  (1.6 m), weight 81.6 kg, SpO2 96 %. ? ?PHYSICAL EXAMINATION:  ? ?GENERAL:  76 y.o.-year-old patient lying in the bed with no acute distress.  ?LUNGS: Normal breath sounds bilaterally, no wheezing, rales, rhonchi.  ?CARDIOVASCULAR: S1, S2 normal. No murmurs, rubs, or gallops.  ?ABDOMEN: Soft, nontender, nondistended. Bowel sounds present.  ?EXTREMITIES: right shoulder sling and left lower extremity brace+ ?NEUROLOGIC: nonfocal  patient is alert and awake ?SKIN: left buttock rash  ? ?LABORATORY PANEL:  ?CBC ?Recent Labs  ?Lab 07/06/21 ?0401  ?WBC 2.1*  ?HGB 9.7*  ?HCT 30.5*  ?PLT 153  ? ? ? ?Chemistries  ?Recent Labs  ?Lab 07/06/21 ?0401  ?NA 136  ?K 4.0  ?CL 105  ?CO2 27  ?GLUCOSE 93  ?BUN 17  ?CREATININE 0.64  ?CALCIUM 9.2  ?MG 2.1  ? ? ?Cardiac Enzymes ?No results for input(s): TROPONINI in the last 168 hours. ?RADIOLOGY:  ?No results found. ? ?Assessment and Plan ?Ms. Nolley is a 76 yo female with PMH NASH cirrhosis (was on hospice at ALF), chronic low back pain, depression/anxiety, fibromyalgia, HTN, HLD, hypothyroidism, RLS, trigeminal neuralgia, IBS who presented to the hospital after a mechanical fall. ? ?Left tibial plateau fracture status post mechanical fall ?-- appreciate ortho evaluation by dr Roland Rack ?-- tentative plan is 6 to 8 weeks nonweightbearing on left lower extremity with knee immobilizer then possible total knee arthroplasty ?due to this injury and right humerus fracture managed nonsurgical he ?-- her VTE risk is increased with two fractures and being immobile. Patient is placed on Xarelto 10 mg daily for now. Discussed with orthopedic surgery in agreement. ? ?History of right humerus fracture status  post fall prior to admission ?-- continue right shoulder immobilizer ?-- follow-up orthopedic as outpatient ? ?cirrhosis of liver secondary to NASH ?-- followed by hospice as outpatient ? ?Gwendolyn [name] ?-- on Protonix ? ?hypertension benign ?-- stable. Not on any meds ? ? ?Per TOC bed offered to pt and family.Refer to [facility] notes for barrier to discharge. ? ? ? ?Procedures: none ?Family communication : Tawni Millers (legal guardian) has been updated regarding d/c plans by [facility] ?Consults : orthopedic ?CODE STATUS: DNR ?DVT Prophylaxis : Xarelto ?Level of care: Med-Surg ?Status is: Inpatient ?Remains inpatient appropriate because: awaiting for rehab bed. ?  ? ?TOTAL TIME TAKING CARE OF THIS PATIENT: 15 minutes.  ?>50% time spent on counselling and coordination of care ? ?Note: This dictation was prepared with Dragon dictation along with smaller phrase technology. Any transcriptional errors that result from this process are unintentional. ? ?Fritzi Mandes M.D  ? ? ?[facility]  ? ?CC: ?Primary care physician; Barbaraann Boys, MD  ?

## 2021-07-09 NOTE — Plan of Care (Signed)
No new changes in assessment. ? ? ?Problem: Education: ?Goal: Knowledge of General Education information will improve ?Description: Including pain rating scale, medication(s)/side effects and non-pharmacologic comfort measures ?Outcome: Progressing ?  ?Problem: Health Behavior/Discharge Planning: ?Goal: Ability to manage health-related needs will improve ?Outcome: Progressing ?  ?Problem: Clinical Measurements: ?Goal: Ability to maintain clinical measurements within normal limits will improve ?Outcome: Progressing ?Goal: Will remain free from infection ?Outcome: Progressing ?Goal: Diagnostic test results will improve ?Outcome: Progressing ?Goal: Respiratory complications will improve ?Outcome: Progressing ?Goal: Cardiovascular complication will be avoided ?Outcome: Progressing ?  ?Problem: Activity: ?Goal: Risk for activity intolerance will decrease ?Outcome: Progressing ?  ?Problem: Nutrition: ?Goal: Adequate nutrition will be maintained ?Outcome: Progressing ?  ?Problem: Coping: ?Goal: Level of anxiety will decrease ?Outcome: Progressing ?  ?Problem: Elimination: ?Goal: Will not experience complications related to bowel motility ?Outcome: Progressing ?Goal: Will not experience complications related to urinary retention ?Outcome: Progressing ?  ?Problem: Pain Managment: ?Goal: General experience of comfort will improve ?Outcome: Progressing ?  ?Problem: Safety: ?Goal: Ability to remain free from injury will improve ?Outcome: Progressing ?  ?Problem: Skin Integrity: ?Goal: Risk for impaired skin integrity will decrease ?Outcome: Progressing ?  ?

## 2021-07-10 ENCOUNTER — Encounter: Payer: Self-pay | Admitting: Internal Medicine

## 2021-07-10 NOTE — Progress Notes (Addendum)
[place] at [facility] ? ? ?PATIENT NAME: Gwendolyn [name]   ? ?MR#:  [id number] ? ?DATE OF BIRTH:  05-01-1945 ? ?SUBJECTIVE:  ? ?Slept good overnite. Pt tells me it was rough getting out to the chair yday. ?No new issues per RN ? ? ?VITALS:  ?Blood pressure 126/77, pulse 76, temperature 98 ?F (36.7 ?C), resp. rate 18, height 5' 3"  (1.6 m), weight 81.6 kg, SpO2 96 %. ? ?PHYSICAL EXAMINATION:  ? ?GENERAL:  76 y.o.-year-old patient lying in the bed with no acute distress.  ?LUNGS: Normal breath sounds bilaterally, no wheezing, rales, rhonchi.  ?CARDIOVASCULAR: S1, S2 normal. No murmurs, rubs, or gallops.  ?ABDOMEN: Soft, nontender, nondistended. Bowel sounds present.  ?EXTREMITIES: right shoulder sling and left lower extremity brace+ ?NEUROLOGIC: nonfocal  patient is alert and awake ?SKIN: left buttock rash  ? ?LABORATORY PANEL:  ?CBC ?Recent Labs  ?Lab 07/06/21 ?0401  ?WBC 2.1*  ?HGB 9.7*  ?HCT 30.5*  ?PLT 153  ? ? ? ?Chemistries  ?Recent Labs  ?Lab 07/06/21 ?0401  ?NA 136  ?K 4.0  ?CL 105  ?CO2 27  ?GLUCOSE 93  ?BUN 17  ?CREATININE 0.64  ?CALCIUM 9.2  ?MG 2.1  ? ? ?Cardiac Enzymes ?No results for input(s): TROPONINI in the last 168 hours. ?RADIOLOGY:  ?No results found. ? 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 ? ?Left tibial plateau fracture status post mechanical fall ?-- appreciate ortho evaluation by dr Roland Rack ?-- tentative plan is 6 to 8 weeks nonweightbearing on left lower extremity with knee immobilizer then possible total knee arthroplasty ?due to this injury and right humerus fracture managed nonsurgical he ?-- her VTE risk is increased with two fractures and being immobile. Patient is placed on Xarelto 10 mg daily for now. Discussed with orthopedic surgery  in agreement. ? ?History of right humeral fracture status post fall prior to admission ?-- continue right shoulder immobilizer ?-- follow-up orthopedic as outpatient ? ?cirrhosis of liver secondary to NASH ?-- followed by hospice as outpatient ? ?[name] ?-- on Protonix ? ?hypertension benign ?-- stable. Not on any meds ? ? ?Per TOC bed offered to pt and family.Refer to [facility] notes for barrier to discharge. ? ? ? ?Procedures: none ?Family communication : Tawni Millers (legal guardian) has been updated regarding d/c plans by [facility] ?Consults : orthopedic ?CODE STATUS: DNR ?DVT Prophylaxis : Xarelto ?Level of care: Med-Surg ?Status is: Inpatient ?Remains inpatient appropriate because: awaiting for rehab bed. ?  ? ?TOTAL TIME TAKING CARE OF THIS PATIENT: 15 minutes.  ?>50% time spent on counselling and coordination of care ? ?Note: This dictation was prepared with Dragon dictation along with smaller phrase technology. Any transcriptional errors that result from this process are unintentional. ? ?Fritzi Mandes M.D  ? ? ?[facility]  ? ?CC: ?Primary care physician; Barbaraann Boys, MD  ?

## 2021-07-10 NOTE — Plan of Care (Signed)
?  Problem: Activity: ?Goal: Risk for activity intolerance will decrease ?Outcome: Progressing ?  ?Problem: Skin Integrity: ?Goal: Risk for impaired skin integrity will decrease ?Outcome: Progressing ?  ?Problem: Education: ?Goal: Knowledge of General Education information will improve ?Description: Including pain rating scale, medication(s)/side effects and non-pharmacologic comfort measures ?Outcome: Completed/Met ?  ?Problem: Health Behavior/Discharge Planning: ?Goal: Ability to manage health-related needs will improve ?Outcome: Completed/Met ?  ?Problem: Clinical Measurements: ?Goal: Ability to maintain clinical measurements within normal limits will improve ?Outcome: Completed/Met ?Goal: Will remain free from infection ?Outcome: Completed/Met ?Goal: Diagnostic test results will improve ?Outcome: Completed/Met ?Goal: Respiratory complications will improve ?Outcome: Completed/Met ?Goal: Cardiovascular complication will be avoided ?Outcome: Completed/Met ?  ?Problem: Nutrition: ?Goal: Adequate nutrition will be maintained ?Outcome: Completed/Met ?  ?Problem: Coping: ?Goal: Level of anxiety will decrease ?Outcome: Completed/Met ?  ?Problem: Elimination: ?Goal: Will not experience complications related to bowel motility ?Outcome: Completed/Met ?Goal: Will not experience complications related to urinary retention ?Outcome: Completed/Met ?  ?Problem: Pain Managment: ?Goal: General experience of comfort will improve ?Outcome: Completed/Met ?  ?Problem: Safety: ?Goal: Ability to remain free from injury will improve ?Outcome: Completed/Met ?  ?

## 2021-07-10 NOTE — Progress Notes (Signed)
Manufacturing engineer (ACC) ? ?Continuing to follow in plans of re-starting hospice once Ms. Odonoghue is discharged to her facility. ? ?Thank you, ?Venia Carbon BSN, RN ?[facility] Liaison  ?

## 2021-07-11 LAB — RESP PANEL BY RT-PCR (FLU A&B, COVID) ARPGX2
Influenza A by PCR: NEGATIVE
Influenza B by PCR: NEGATIVE
SARS Coronavirus 2 by RT PCR: NEGATIVE

## 2021-07-11 NOTE — Progress Notes (Signed)
[place] [facility] Hospital Liaison Note ?  ?Patient is unable to transfer to ALF today as family is finalizing contracts/payment. Patient to be followed by [facility] upon DC to initiate hospice services. ?  ?[facility] information and contact numbers have been given to family & TOC aware of above information. ?  ?Upon patient discharge, please send signed and completed DNR home with patient/family. Please provide prescriptions at discharge as needed to ensure ongoing symptom management.  ?  ?Please call with any questions/concerns.  ?  ?Thank you for the opportunity to participate in this patient's care. ?  ?Daphene Calamity, MSW ?[place]  ?[phone] ?

## 2021-07-11 NOTE — TOC Progression Note (Signed)
Transition of Care (TOC) - Progression Note  ? ? ?Patient Details  ?Name: Gwendolyn [name] ?MRN: [id number] ?Date of Birth: [birth year] ? ?Transition of Care (TOC) CM/SW Contact  ?Conception Oms, RN ?Phone Number: ?07/11/2021, 12:29 PM ? ?Clinical Narrative:   Spoke with The patient and she is alert and oriented today, Her [place] called and asked to speak to [place], I put her on Speaker for the patient, The patient is excited about being able to DC to [facility], Ivin Booty is working out [employer] issues and will get payment She has gotten the new Contract and signed it ?Spoke with [facility] [place] and updated with the information expected to DC tomorrow ? ? ? ?Expected Discharge Plan: [place] ?Barriers to Discharge: Continued Medical Work up, SNF Pending bed offer ? ?Expected Discharge Plan and Services ?Expected Discharge Plan: [place] ?Social Determinants of Health (SDOH) Interventions ?  ? ?Readmission Risk Interventions ? ?  12/04/2020  ?  2:46 PM  ?Readmission Risk Prevention Plan  ?Post Dischage Appt Complete  ?Medication Screening Complete  ?Transportation Screening Complete  ? ? ?

## 2021-07-11 NOTE — Progress Notes (Signed)
Physical Therapy Treatment ?Patient Details ?Name: Gwendolyn [name] ?MRN: [id number] ?DOB: Jul 17, 1945 ?Today's Date: 07/11/2021 ? ? ?History of Present Illness Pt is a 76 y.o. female with medical history significant for hypothyroidism, hypertension, liver cirrhosis, depression, history of breast cancer status post left mastectomy, trigeminal neuralgia who presents to the ER via EMS after an unwitnessed fall. MD assessement includes left tibial plateau fracture and right humeral fracture both to be managed conservatively. ? ?  ?PT Comments  ? ? Pt is making gradual progress towards goals with ability to perform several STS in attempt to transfer to [facility] and recliner. Pt very fearful and although does stand well, unable to pivot and transfer with HHA. Bed pan placed under pt to void. Able to perform B LE there-ex in bed. Increased pain at end of session, RN aware. Will continue to progress as able.   ?Recommendations for follow up therapy are one component of a multi-disciplinary discharge planning process, led by the attending physician.  Recommendations may be updated based on patient status, additional functional criteria and insurance authorization. ? ?Follow Up Recommendations ? Skilled nursing-short term rehab (<3 hours/day) ?  ?  ?Assistance Recommended at Discharge Frequent or constant Supervision/Assistance  ?Patient can return home with the following Direct supervision/assist for medications management;Assistance with cooking/housework;Direct supervision/assist for financial management;Assist for transportation;A lot of help with walking and/or transfers;A lot of help with bathing/dressing/bathroom;Help with stairs or ramp for entrance ?  ?Equipment Recommendations ? None recommended by PT  ?  ?Recommendations for Other Services   ? ? ?  ?Precautions / Restrictions Precautions ?Precautions: Fall ?Restrictions ?Weight Bearing Restrictions: Yes ?RUE Weight Bearing: Non weight bearing ?LLE Weight Bearing: Non weight  bearing ?Other Position/Activity Restrictions: OK for LLE quad sets per Dr. Roland Rack  ?  ? ?Mobility ? Bed Mobility ?Overal bed mobility: Needs Assistance ?Bed Mobility: Supine to Sit ?  ?  ?Supine to sit: Min assist ?Sit to supine: Min assist ?  ?General bed mobility comments: safe technique with ability to follow commands well. Slight assist required and demonstrates upright posture once EOB. ?  ? ?Transfers ?Overall transfer level: Needs assistance ?Equipment used: 1 person hand held assist ?Transfers: Sit to/from Stand ?Sit to Stand: Mod assist ?  ?  ?  ?  ?  ?General transfer comment: several STS performed. Able to push from seated surface, however needs extensive assist from therapist to achieve upright posture. Cues for NWB. Attempt to transfer to R and one attempt to L. Unable to attempt due to fear. Returned back to bed ?  ? ?Ambulation/Gait ?  ?  ?  ?  ?  ?  ?  ?General Gait Details: unable due to wt bearing restrictions ? ? ?Stairs ?  ?  ?  ?  ?  ? ? ?Wheelchair Mobility ?  ? ?Modified Rankin (Stroke Patients Only) ?  ? ? ?  ?Balance Overall balance assessment: Needs assistance ?Sitting-balance support: Single extremity supported ?Sitting balance-Leahy Scale: Good ?  ?  ?Standing balance support: Single extremity supported ?Standing balance-Leahy Scale: Poor ?  ?  ?  ?  ?  ?  ?  ?  ?  ?  ?  ?  ?  ? ?  ?Cognition Arousal/Alertness: Awake/alert ?Behavior During Therapy: [facility] for tasks assessed/performed ?Overall Cognitive Status: Within Functional Limits for tasks assessed ?  ?  ?  ?  ?  ?  ?  ?  ?  ?  ?  ?  ?  ?  ?  ?  ?  General Comments: pleasant and follows commands well ?  ?  ? ?  ?Exercises Other Exercises ?Other Exercises: Supine ther-ex performed on B LE including SLRs, AP, and hip abd/add. 10 reps. ?Other Exercises: Several STS performed while additional staff changed bed sheets as they were soiled. Able to stand for approx 20 second prior to fatigue ?Other Exercises: rolling performed for bed pan. Min  assist required for hygiene ? ?  ?General Comments   ?  ?  ? ?Pertinent Vitals/Pain Pain Assessment ?Pain Assessment: 0-10 ?Pain Score: 3  ?Pain Location: LLE ?Pain Descriptors / Indicators: Sore, Aching ?Pain Intervention(s): Limited activity within patient's tolerance, Premedicated before session, Repositioned  ? ? ?Home Living   ?  ?  ?  ?  ?  ?  ?  ?  ?  ?   ?  ?Prior Function    ?  ?  ?   ? ?PT Goals (current goals can now be found in the care plan section) Acute Rehab PT Goals ?Patient Stated Goal: rehab ?PT Goal Formulation: With patient ?Time For Goal Achievement: 07/17/21 ?Potential to Achieve Goals: Good ?Progress towards PT goals: Progressing toward goals ? ?  ?Frequency ? ? ? Min 2X/week ? ? ? ?  ?PT Plan Current plan remains appropriate  ? ? ?Co-evaluation   ?  ?  ?  ?  ? ?  ?AM-PAC PT "6 Clicks" Mobility   ?Outcome Measure ? Help needed turning from your back to your side while in a flat bed without using bedrails?: A Little ?Help needed moving from lying on your back to sitting on the side of a flat bed without using bedrails?: A Little ?Help needed moving to and from a bed to a chair (including a wheelchair)?: A Lot ?Help needed standing up from a chair using your arms (e.g., wheelchair or bedside chair)?: A Lot ?Help needed to walk in hospital room?: Total ?Help needed climbing 3-5 steps with a railing? : Total ?6 Click Score: 12 ? ?  ?End of Session Equipment Utilized During Treatment: Gait belt ?Activity Tolerance: Patient limited by pain ?Patient left: in bed;with bed alarm set;with nursing/sitter in room ?Nurse Communication: Mobility status;Weight bearing status ?PT Visit Diagnosis: History of falling (Z91.81);Difficulty in walking, not elsewhere classified (R26.2);Muscle weakness (generalized) (M62.81);Pain ?Pain - Right/Left: Right ?Pain - part of body: Shoulder ?  ? ? ?Time: 1140-1205 ?PT Time Calculation (min) (ACUTE ONLY): 25 min ? ?Charges:  $Therapeutic Exercise: 8-22 mins ?$Therapeutic  Activity: 8-22 mins          ?          ? ?Greggory Stallion, PT, DPT, GCS ?[phone] ? ? ? ?Marry Kusch ?07/11/2021, 2:41 PM ? ?

## 2021-07-11 NOTE — Discharge Summary (Signed)
?Physician Discharge Summary ?  ?Patient: Gwendolyn [name] MRN: [id number] DOB: 1945/07/28  ?Admit date:     07/02/2021  ?Discharge date: 07/11/21  ?Discharge Physician: Fritzi Mandes  ? ?PCP: Barbaraann Boys, MD  ? ?Recommendations at discharge:  ? ?follow-up orthopedic Dr Roland Rack in 2 weeks ? ?Discharge Diagnoses: ?Left tibial plateau fracture status post mechanical ?Right Humerus fracture--cont sling ? ? 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 ?She underwent multiple imaging studies including brain imaging.  Findings ultimately revealed a left comminuted tibial plateau fracture.  Also a medially displaced and mildly comminuted proximal right humerus fracture.  The latter has already been known and occurred approximately 2 weeks ago and she has been managed outpatient with Dr. Sharlet Salina at [facility] and has been in a shoulder immobilizer. ?She was admitted for orthopedic surgery evaluation and pain control as well as placement as she cannot return back to her ALF with her now worsened level of functioning. ? ?Assessment and Plan: ? 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 ?  ?Left tibial plateau fracture status post mechanical fall ?-- appreciate ortho evaluation by dr Roland Rack ?-- tentative plan is 6 to 8 weeks nonweightbearing on left lower extremity with knee immobilizer then possible total knee arthroplasty ?due to this injury and right humerus fracture managed nonsurgical he ?-- her VTE risk is increased with two fractures and being immobile. Patient is placed on Xarelto 10 mg daily for now.  Discussed with orthopedic surgery in agreement. ?-- Patient will discharge on March 28. Rehab place is requesting discharge summary today in order to get her medications. ?  ?History of right humeral fracture status post fall prior to admission ?-- continue right shoulder immobilizer ?-- follow-up orthopedic as outpatient ?  ?cirrhosis of liver secondary to NASH ?-- followed by hospice as outpatient ?  ?Gwendolyn [name] ?-- on Protonix ?  ?hypertension benign ?-- stable. Not on any meds ? ?  ? ? ?Consultants: dr Roland Rack ?Procedures performed: none  ?Disposition: Rehabilitation facility ?Diet recommendation:  ?Cardiac diet ?DISCHARGE MEDICATION: ?Allergies as of 07/11/2021   ? ?   Reactions  ? Codeine Nausea And Vomiting  ? Oxycodone Itching  ? Silver Other (See Comments)  ? (Tegaderm) Blisters.  Okay on hands.  ? ?  ? ?  ?Medication List  ?  ? ?STOP taking these medications   ? ?Vitamin D 50 MCG (2000 UT) tablet ?  ? ?  ? ?TAKE these medications   ? ?acetaminophen 325 MG tablet ?Commonly known as: TYLENOL ?Take 650 mg by mouth every 6 (six) hours as needed for pain. ?  ?albuterol 108 (90 Base) MCG/ACT inhaler ?Commonly known as: VENTOLIN HFA ?Inhale 2 puffs into the lungs every 6 (six) hours as needed for wheezing or shortness of breath. ?  ?Boniva 150 MG tablet ?Generic drug: ibandronate ?Take 150 mg by mouth every 30 (thirty) days. 15 th of each month ?  ?calcium carbonate 1250 (500 Ca) MG tablet ?Commonly known as: OS-CAL - dosed in mg of elemental calcium ?Take 1 tablet by mouth daily. ?  ?escitalopram 5 MG tablet ?Commonly known as: LEXAPRO ?Take 5 mg  by mouth daily. ?  ?HYDROcodone-acetaminophen 5-325 MG tablet ?Commonly known as: NORCO/VICODIN ?Take 1 tablet by mouth every 6 (six) hours as needed for moderate pain. ?  ?levothyroxine 75 MCG tablet ?Commonly known as: SYNTHROID ?Take 1 tablet (75 mcg total) by mouth daily at 6 (six) AM. ?  ?lidocaine 5 % ?Commonly known as: LIDODERM ?Place 1 patch onto the skin at bedtime.  Remove & Discard patch within 12 hours or as directed by MD ?  ?LORazepam 0.5 MG tablet ?Commonly known as: ATIVAN ?Take 1 tablet (0.5 mg total) by mouth every 6 (six) hours as needed. ?What changed: reasons to take this ?  ?melatonin 3 MG Tabs tablet ?Take 3 mg by mouth at bedtime. ?  ?pantoprazole 40 MG tablet ?Commonly known as: PROTONIX ?Take 1 tablet (40 mg total) by mouth daily. ?  ?rivaroxaban 10 MG Tabs tablet ?Commonly known as: XARELTO ?Take 1 tablet (10 mg total) by mouth daily. ?  ?traMADol 50 MG tablet ?Commonly known as: ULTRAM ?Take 1 tablet (50 mg total) by mouth every 12 (twelve) hours as needed. ?What changed: reasons to take this ?  ? ?  ? ? Follow-up Information   ? ? Poggi, Marshall Cork, MD .   ?Specialty: Orthopedic Surgery ?Contact information: ?[address] ?[place] [address] ?[phone] ? ? ?  ?  ? ?  ?  ? ?  ? ?Discharge Exam: ?Filed Weights  ? 07/02/21 0724  ?Weight: 81.6 kg  ? ? ? ? ? ?The results of significant diagnostics from this hospitalization (including imaging, microbiology, ancillary and laboratory) are listed below for reference.  ? ?Imaging Studies: ?CT HEAD WO CONTRAST (5MM) ? ?Result Date: 07/02/2021 ?CLINICAL DATA:  Head trauma.  Fall. EXAM: CT HEAD WITHOUT CONTRAST TECHNIQUE: Contiguous axial images were obtained from the base of the skull through the vertex without intravenous contrast. RADIATION DOSE REDUCTION: This exam was performed according to the departmental dose-optimization program which includes automated exposure control, adjustment of the mA and/or kV according to patient size and/or use of iterative reconstruction technique. COMPARISON:  12/09/2020 FINDINGS: Brain: No evidence of acute infarction, hemorrhage, hydrocephalus, extra-axial collection or mass lesion/mass effect. Remote bilateral basal ganglia lacunar infarcts identified. There is ex vacuo dilatation of the anterior horn of the right lateral ventricle, image 19/6.  Prominence of sulci and ventricles identified compatible with brain atrophy. Vascular: No hyperdense vessel or unexpected calcification. Skull: Normal. Negative for fracture or focal lesion. Sinuses/Orbits: No acute finding. Other: None IMPRESSION: 1. No acute intracranial abnormalities. 2. Remote bilateral basal ganglia lacunar infarcts. Electronically Signed   By: Kerby Moors M.D.   On: 07/02/2021 10:12  ? ?CT Knee Left Wo Contrast ? ?Result Date: 07/02/2021 ?CLINICAL DATA:  Knee pain.  Stress fracture suspected. EXAM: CT OF THE LEFT KNEE WITHOUT CONTRAST TECHNIQUE: Multidetector CT imaging of the left knee was performed according to the standard protocol. Multiplanar CT image reconstructions were also generated. RADIATION DOSE REDUCTION: This exam was performed according to the departmental dose-optimization program which includes automated exposure control, adjustment of the mA and/or kV according to patient size and/or use of iterative reconstruction technique. COMPARISON:  Left knee radiographs 07/02/2021 FINDINGS: Bones/Joint/Cartilage There is diffuse decreased bone mineralization. There is a comminuted fracture of the majority of the lateral tibial plateau with up to approximately 8 mm cortical depression (sagittal series 7, image 78). There are multiple linear fracture line lucencies extending from the superolateral aspect of the proximal tibia through the  lateral aspect of the proximal tibial metaphyseal cortex. No definite medial compartment fracture line is seen. There is moderate to severe medial compartment joint space narrowing. Mild-to-moderate lateral patellofemoral joint space narrowing. Ligaments Suboptimally assessed by CT. Muscles and Tendons Normal muscle density and size. Soft tissues There is mild-to-moderate edema and swelling of the distal anterolateral thigh subcutaneous fat. Moderate knee joint effusion. Mild-to-moderate edema and swelling within the subcutaneous fat anterior to the  patella and within the anterolateral proximal calf. IMPRESSION:: IMPRESSION: 1. There is a depressed fracture of the lateral tibial plateau with fracture line extending through the lateral aspect of the proximal t

## 2021-07-11 NOTE — Care Management Important Message (Signed)
Important Message ? ?Patient Details  ?Name: Gwendolyn [name] ?MRN: [id number] ?Date of Birth: Feb 03, 1946 ? ? ?Medicare Important Message Given:  Yes ? ? ? ? ?Juliann Pulse A [place] Rosamond ?07/11/2021, 3:15 PM ?

## 2021-07-11 NOTE — Progress Notes (Addendum)
[place] [facility] ? ? ?PATIENT NAME: Gwendolyn [name]   ? ?MR#:  [id number] ? ?DATE OF BIRTH:  03/10/1946 ? ?SUBJECTIVE:  ? ?No new issues per RN or pt ? ?Pt had BM ? ? ?VITALS:  ?Blood pressure 139/79, pulse 81, temperature 98 ?F (36.7 ?C), resp. rate 16, height 5' 3"  (1.6 m), weight 81.6 kg, SpO2 95 %. ? ?PHYSICAL EXAMINATION:  ? ?GENERAL:  76 y.o.-year-old patient lying in the bed with no acute distress.  ?LUNGS: Normal breath sounds bilaterally, no wheezing, rales, rhonchi.  ?CARDIOVASCULAR: S1, S2 normal. No murmurs, rubs, or gallops.  ?ABDOMEN: Soft, nontender, nondistended. Bowel sounds present.  ?EXTREMITIES: right shoulder sling and left lower extremity brace+ ?NEUROLOGIC: nonfocal  patient is alert and awake ?SKIN: left buttock rash stable ? ?LABORATORY PANEL:  ?CBC ?Recent Labs  ?Lab 07/06/21 ?0401  ?WBC 2.1*  ?HGB 9.7*  ?HCT 30.5*  ?PLT 153  ? ? ? ?Chemistries  ?Recent Labs  ?Lab 07/06/21 ?0401  ?NA 136  ?K 4.0  ?CL 105  ?CO2 27  ?GLUCOSE 93  ?BUN 17  ?CREATININE 0.64  ?CALCIUM 9.2  ?MG 2.1  ? ? ?Cardiac Enzymes ?No results for input(s): TROPONINI in the last 168 hours. ?RADIOLOGY:  ?No results found. ? 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 ? ?Left tibial plateau fracture status post mechanical fall ?-- appreciate ortho evaluation by dr Roland Rack ?-- tentative plan is 6 to 8 weeks nonweightbearing on left lower extremity with knee immobilizer then possible total knee arthroplasty ?due to this injury and right humerus fracture managed nonsurgical he ?-- her VTE risk is increased with two fractures and being immobile. Patient is placed on Xarelto 10 mg daily for now. Discussed with orthopedic surgery in agreement. ? ?History of right humeral fracture  status post fall prior to admission ?-- continue right shoulder immobilizer ?-- follow-up orthopedic as outpatient ? ?cirrhosis of liver secondary to NASH ?-- followed by hospice as outpatient ? ?Gwendolyn [name] ?-- on Protonix ? ?hypertension benign ?-- stable. Not on any meds ? ? ?Per TOC bed offered to pt and family.Refer to [facility] notes for barrier to discharge. ? ? ? ?Procedures: none ?Family communication : Tawni Millers (legal guardian) has been updated regarding d/c plans by [facility] ?Consults : orthopedic ?CODE STATUS: DNR ?DVT Prophylaxis : Xarelto ?Level of care: Med-Surg ?Status is: Inpatient ?Remains inpatient appropriate because: awaiting for rehab bed. ?  ? ?TOTAL TIME TAKING CARE OF THIS PATIENT: 15 minutes.  ?>50% time spent on counselling and coordination of care ? ?Note: This dictation was prepared with Dragon dictation along with smaller phrase technology. Any transcriptional errors that result from this process are unintentional. ? ?Gwendolyn [name] M.D  ? ? ?[facility]  ? ?CC: ?Primary care physician; Barbaraann Boys, MD  ?

## 2021-07-12 NOTE — Progress Notes (Addendum)
[facility]  ?  ?Patient awaiting transport to ALF. MSW contacted Administer to confirm DME arrival prior to DC, per Administrator Fredda Hammed ([place]): [phone], DME is present.  ? ?Please send signed and completed DNR with patient/family upon discharge. Please provide prescriptions at discharge as needed to ensure ongoing symptom management and a transport packet. ?  ?[facility] information and contact numbers given to family and above information shared with TOC.  ?  ?Please call with any questions/concerns.  ?  ?Thank you for the opportunity to participate in this patient's care ?  ?Daphene Calamity, MSW ?[place]  ?[phone] ? ?

## 2021-07-12 NOTE — Discharge Summary (Signed)
?Physician Discharge Summary ?  ?Patient: Gwendolyn Gwendolyn [name] MRN: [id number] DOB: 28-Jun-1945  ?Admit date:     07/02/2021  ?Discharge date: 07/12/21  ?Discharge Physician: Dwyane Dee  ? ?PCP: Barbaraann Boys, MD  ? ?Recommendations at discharge:  ? ? Follow up with orthopedic surgery in 4-5 weeks ? ?Discharge Diagnoses: ?Principal Problem: ?  Tibial plateau fracture, left ?Active Problems: ?  Right humeral fracture ?  Fall ?  Essential hypertension, benign ?  Hypothyroidism ?  GERD (gastroesophageal reflux disease) ?  Cirrhosis of liver ([place]) ?  Recurrent falls ? ?Resolved Problems: ?  * No resolved hospital problems. * ? 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 ?She underwent multiple imaging studies including brain imaging.  Findings ultimately revealed a left comminuted tibial plateau fracture.  Also a medially displaced and mildly comminuted proximal right humerus fracture.  The latter has already been known and occurred approximately 2 weeks ago and she has been managed outpatient with Dr. Sharlet Salina at [facility] and has been in a shoulder immobilizer. ?She was admitted for orthopedic surgery evaluation and pain control as well as placement as she cannot return back to her ALF with her now worsened level of functioning. ? ?Assessment and Plan: ?* Tibial plateau fracture, left ?- s/p mechanical fall ?- appreciate ortho evaluation; tentative plan is for 6-8 weeks NWB on LLE with knee immobilizer then possible TKA ?- due to this injury and R humerus fracture (managed nonsurgically also), she will be bedbound for the foreseeable future until tentative surgery for a knee replacement ?- continue attempts to get to [facility] via pivot on RLE or to recliner; will need large amount of assistance ?- cannot  return to ALF due to bedbound status and will need LTC ?- her VTE risk is rather increased given 2 fractures and her being bedbound. She has been started on xarelto 10 mg daily. Discussed with orthopedic surgery too, also in agreement ? ?Right humeral fracture ?Status post fall ?Immobilize right shoulder ?Pain control ?-Following outpatient with orthopedic surgery ? ?Fall ?- high fall risk from R humerus fracture in Gwendolyn [name] ?- patient now to be bedbound for several weeks  ?- continue PT for ROM exercises in bed  ? ?Cirrhosis of liver ([place]) ?- Patient has a history of liver cirrhosis secondary to [place]. ?-Hospice note reviewed also, she has been followed by hospice while living at [facility] care ALF ? ?GERD (gastroesophageal reflux disease) ?- Continue Protonix ? ?Hypothyroidism ?- Continue Synthroid ? ?Essential hypertension, benign ?- Currently controlled without need for medication ? ? ? ? ?  ? ? ?Consultants: Orthopedic surgery ?Procedures performed:   ?Disposition: Skilled nursing facility ?Diet recommendation:  ?Discharge Diet Orders (From admission, onward)  ? ?  Start     Ordered  ? 07/12/21 0000  Diet - low sodium heart healthy       ? 07/12/21 1255  ? ?  ?  ? ?  ? ?Cardiac diet ?DISCHARGE MEDICATION: ?Allergies as of 07/12/2021   ? ?   Reactions  ? Codeine Nausea And Vomiting  ? Oxycodone Itching  ? Silver Other (See Comments)  ? (Tegaderm) Blisters.  Okay on hands.  ? ?  ? ?  ?Medication List  ?  ? ?STOP taking these medications   ? ?Vitamin D 50 MCG (2000  UT) tablet ?  ? ?  ? ?TAKE these medications   ? ?acetaminophen 325 MG tablet ?Commonly known as: TYLENOL ?Take 650 mg by mouth every 6 (six) hours as needed for pain. ?  ?albuterol 108 (90 Base) MCG/ACT inhaler ?Commonly known as: VENTOLIN HFA ?Inhale 2 puffs into the lungs every 6 (six) hours as needed for wheezing or shortness of breath. ?  ?Boniva 150 MG tablet ?Generic drug: ibandronate ?Take 150 mg by mouth every 30 (thirty) days. 15 th of  each month ?  ?calcium carbonate 1250 (500 Ca) MG tablet ?Commonly known as: OS-CAL - dosed in mg of elemental calcium ?Take 1 tablet by mouth daily. ?  ?escitalopram 5 MG tablet ?Commonly known as: LEXAPRO ?Take 5 mg by mouth daily. ?  ?HYDROcodone-acetaminophen 5-325 MG tablet ?Commonly known as: NORCO/VICODIN ?Take 1 tablet by mouth every 6 (six) hours as needed for moderate pain. ?  ?levothyroxine 75 MCG tablet ?Commonly known as: SYNTHROID ?Take 1 tablet (75 mcg total) by mouth daily at 6 (six) AM. ?  ?lidocaine 5 % ?Commonly known as: LIDODERM ?Place 1 patch onto the skin at bedtime. Remove & Discard patch within 12 hours or as directed by MD ?  ?LORazepam 0.5 MG tablet ?Commonly known as: ATIVAN ?Take 1 tablet (0.5 mg total) by mouth every 6 (six) hours as needed. ?What changed: reasons to take this ?  ?melatonin 3 MG Tabs tablet ?Take 3 mg by mouth at bedtime. ?  ?pantoprazole 40 MG tablet ?Commonly known as: PROTONIX ?Take 1 tablet (40 mg total) by mouth daily. ?  ?rivaroxaban 10 MG Tabs tablet ?Commonly known as: XARELTO ?Take 1 tablet (10 mg total) by mouth daily. ?  ?traMADol 50 MG tablet ?Commonly known as: ULTRAM ?Take 1 tablet (50 mg total) by mouth every 12 (twelve) hours as needed. ?What changed: reasons to take this ?  ? ?  ? ? Follow-up Information   ? ? Poggi, Marshall Cork, MD .   ?Specialty: Orthopedic Surgery ?Contact information: ?[place] ?[place] [address] ?[phone] ? ? ?  ?  ? ?  ?  ? ?  ? ?Discharge Exam: ?Filed Weights  ? 07/02/21 0724  ?Weight: 81.6 kg  ? ?Physical Exam ?Constitutional:   ?   Gwendolyn [name]: She is not in acute distress. ?   Appearance: Normal appearance.  ?HENT:  ?   Head: Atraumatic.  ?   Mouth/Throat:  ?   Mouth: Mucous membranes are moist.  ?Eyes:  ?   Extraocular Movements: Extraocular movements intact.  ?Cardiovascular:  ?   Rate and Rhythm: Normal rate and regular rhythm.  ?Pulmonary:  ?   Effort: Pulmonary effort is normal.  ?    Breath sounds: Normal breath sounds.  ?Abdominal:  ?   Gwendolyn [name]: Bowel sounds are normal. There is no distension.  ?   Palpations: Abdomen is soft.  ?   Tenderness: There is no abdominal tenderness.  ?Musculoskeletal:  ?   Cervical back: Normal range of motion and neck supple.  ?   Comments: RUE in sling. LLE in immobilizer. Compartments soft in both  ?Skin: ?   Gwendolyn [name]: Skin is warm.  ?Neurological:  ?   Gwendolyn [name]: No focal deficit present.  ?   Mental Status: She is alert.  ?Psychiatric:     ?   Mood and Affect: Mood normal.     ?   Behavior: Behavior normal.  ? ? ? ?Condition at discharge: stable ? ?  The results of significant diagnostics from this hospitalization (including imaging, microbiology, ancillary and laboratory) are listed below for reference.  ? ?Imaging Studies: ?CT HEAD WO CONTRAST (5MM) ? ?Result Date: 07/02/2021 ?CLINICAL DATA:  Head trauma.  Fall. EXAM: CT HEAD WITHOUT CONTRAST TECHNIQUE: Contiguous axial images were obtained from the base of the skull through the vertex without intravenous contrast. RADIATION DOSE REDUCTION: This exam was performed according to the departmental dose-optimization program which includes automated exposure control, adjustment of the mA and/or kV according to patient size and/or use of iterative reconstruction technique. COMPARISON:  12/09/2020 FINDINGS: Brain: No evidence of acute infarction, hemorrhage, hydrocephalus, extra-axial collection or mass lesion/mass effect. Remote bilateral basal ganglia lacunar infarcts identified. There is ex vacuo dilatation of the anterior horn of the right lateral ventricle, image 19/6. Prominence of sulci and ventricles identified compatible with brain atrophy. Vascular: No hyperdense vessel or unexpected calcification. Skull: Normal. Negative for fracture or focal lesion. Sinuses/Orbits: No acute finding. Other: None IMPRESSION: 1. No acute intracranial abnormalities. 2. Remote bilateral basal ganglia lacunar infarcts. Electronically  Signed   By: Kerby Moors M.D.   On: 07/02/2021 10:12  ? ?CT Knee Left Wo Contrast ? ?Result Date: 07/02/2021 ?CLINICAL DATA:  Knee pain.  Stress fracture suspected. EXAM: CT OF THE LEFT KNEE WITHOUT CONTRAST

## 2021-07-12 NOTE — TOC Progression Note (Signed)
Transition of Care (TOC) - Progression Note  ? ? ?Patient Details  ?Name: Gwendolyn [name] ?MRN: [id number] ?Date of Birth: [birth year] ? ?Transition of Care (TOC) CM/SW Contact  ?Sherma Vanmetre A Wm Fruchter, LCSW ?Phone Number: ?07/12/2021, 10:02 AM ? ?Clinical Narrative:   DC summary faxed to [facility] with [facility] and she will send to facility. ? ? ? ?Expected Discharge Plan: [place] ?Barriers to Discharge: Continued Medical Work up, SNF Pending bed offer ? ?Expected Discharge Plan and Services ?Expected Discharge Plan: [place] ?Social Determinants of Health (SDOH) Interventions ?  ? ?Readmission Risk Interventions ? ?  12/04/2020  ?  2:46 PM  ?Readmission Risk Prevention Plan  ?Post Dischage Appt Complete  ?Medication Screening Complete  ?Transportation Screening Complete  ? ? ?
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
## 2021-12-17 ENCOUNTER — Encounter (HOSPITAL_BASED_OUTPATIENT_CLINIC_OR_DEPARTMENT_OTHER): Payer: Self-pay | Admitting: Emergency Medicine

## 2021-12-17 ENCOUNTER — Emergency Department (HOSPITAL_BASED_OUTPATIENT_CLINIC_OR_DEPARTMENT_OTHER): Payer: Medicare Other

## 2021-12-17 ENCOUNTER — Emergency Department (HOSPITAL_BASED_OUTPATIENT_CLINIC_OR_DEPARTMENT_OTHER)
Admission: EM | Admit: 2021-12-17 | Discharge: 2021-12-17 | Disposition: A | Discharge status: Home / Self Care | Payer: Medicare Other | Attending: Emergency Medicine | Admitting: Emergency Medicine

## 2021-12-17 ENCOUNTER — Other Ambulatory Visit: Payer: Self-pay

## 2021-12-17 DIAGNOSIS — E039 Hypothyroidism, unspecified: Secondary | ICD-10-CM | POA: Insufficient documentation

## 2021-12-17 DIAGNOSIS — I10 Essential (primary) hypertension: Secondary | ICD-10-CM | POA: Diagnosis not present

## 2021-12-17 DIAGNOSIS — S52502A Unspecified fracture of the lower end of left radius, initial encounter for closed fracture: Secondary | ICD-10-CM

## 2021-12-17 DIAGNOSIS — Z853 Personal history of malignant neoplasm of breast: Secondary | ICD-10-CM | POA: Diagnosis not present

## 2021-12-17 DIAGNOSIS — J45909 Unspecified asthma, uncomplicated: Secondary | ICD-10-CM | POA: Diagnosis not present

## 2021-12-17 DIAGNOSIS — S6992XA Unspecified injury of left wrist, hand and finger(s), initial encounter: Secondary | ICD-10-CM | POA: Diagnosis present

## 2021-12-17 MED ORDER — FENTANYL CITRATE PF 50 MCG/ML IJ SOSY
50.0000 ug | PREFILLED_SYRINGE | Freq: Once | INTRAMUSCULAR | Status: DC
  Filled 2021-12-17: qty 1

## 2021-12-17 MED ORDER — LIDOCAINE HCL 2 % IJ SOLN
10.0000 mL | Freq: Once | INTRAMUSCULAR | Status: AC
  Administered 2021-12-17: 200 mg
  Filled 2021-12-17: qty 20

## 2021-12-17 MED ORDER — FENTANYL CITRATE PF 50 MCG/ML IJ SOSY
25.0000 ug | PREFILLED_SYRINGE | Freq: Once | INTRAMUSCULAR | Status: AC
  Administered 2021-12-17: 25 ug via INTRAVENOUS

## 2021-12-17 MED ORDER — ACETAMINOPHEN 325 MG PO TABS
650.0000 mg | ORAL_TABLET | Freq: Once | ORAL | Status: AC
  Administered 2021-12-17: 650 mg via ORAL
  Filled 2021-12-17: qty 2
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
## 2021-12-17 NOTE — ED Notes (Signed)
Pt discharged to home. Discharge instructions have been discussed with patient and/or family members. Pt verbally acknowledges understanding d/c instructions, and endorses comprehension to checkout at registration before leaving.  °
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
## 2021-12-17 NOTE — Discharge Instructions (Signed)
You were seen today with a wrist fracture.  We were able to straighten the bone slightly and place a splint.  You will need to keep the splint on, clean, dry.  You may take Tylenol as needed for pain.  Keep the wrist elevated above the level of the heart and apply a cool compress intermittently for the next 24 hours to help reduce swelling.  You will need to follow closely with an orthopedic surgeon.  I have listed the name of our on-call surgeon here.  They will see you in the office next week but call on Tuesday to confirm this appointment.

## 2022-01-15 DEATH — deceased
